# Patient Record
Sex: Female | Born: 1965 | Race: White | Hispanic: No | Smoking: Current every day smoker
Health system: Southern US, Community
[De-identification: ages and names within clinical notes are randomized; demographics above are authoritative.]

## PROBLEM LIST (undated history)

## (undated) DIAGNOSIS — F419 Anxiety disorder, unspecified: Secondary | ICD-10-CM

## (undated) DIAGNOSIS — G43909 Migraine, unspecified, not intractable, without status migrainosus: Secondary | ICD-10-CM

## (undated) DIAGNOSIS — J449 Chronic obstructive pulmonary disease, unspecified: Secondary | ICD-10-CM

## (undated) DIAGNOSIS — E119 Type 2 diabetes mellitus without complications: Secondary | ICD-10-CM

## (undated) DIAGNOSIS — G4730 Sleep apnea, unspecified: Secondary | ICD-10-CM

## (undated) DIAGNOSIS — T7840XA Allergy, unspecified, initial encounter: Secondary | ICD-10-CM

## (undated) DIAGNOSIS — IMO0002 Reserved for concepts with insufficient information to code with codable children: Secondary | ICD-10-CM

## (undated) DIAGNOSIS — Z87448 Personal history of other diseases of urinary system: Secondary | ICD-10-CM

## (undated) DIAGNOSIS — F329 Major depressive disorder, single episode, unspecified: Secondary | ICD-10-CM

## (undated) DIAGNOSIS — M1990 Unspecified osteoarthritis, unspecified site: Secondary | ICD-10-CM

## (undated) DIAGNOSIS — F32A Depression, unspecified: Secondary | ICD-10-CM

## (undated) DIAGNOSIS — K219 Gastro-esophageal reflux disease without esophagitis: Secondary | ICD-10-CM

## (undated) DIAGNOSIS — J45909 Unspecified asthma, uncomplicated: Secondary | ICD-10-CM

## (undated) DIAGNOSIS — N189 Chronic kidney disease, unspecified: Secondary | ICD-10-CM

## (undated) DIAGNOSIS — I10 Essential (primary) hypertension: Secondary | ICD-10-CM

## (undated) HISTORY — PX: COLON SURGERY: SHX602

## (undated) HISTORY — DX: Sleep apnea, unspecified: G47.30

## (undated) HISTORY — PX: LAPAROSCOPIC CHOLECYSTECTOMY: SUR755

## (undated) HISTORY — PX: ABDOMINAL HYSTERECTOMY: SHX81

## (undated) HISTORY — DX: Unspecified asthma, uncomplicated: J45.909

## (undated) HISTORY — DX: Anxiety disorder, unspecified: F41.9

## (undated) HISTORY — DX: Personal history of other diseases of urinary system: Z87.448

## (undated) HISTORY — DX: Unspecified osteoarthritis, unspecified site: M19.90

## (undated) HISTORY — DX: Essential (primary) hypertension: I10

## (undated) HISTORY — DX: Gastro-esophageal reflux disease without esophagitis: K21.9

## (undated) HISTORY — DX: Major depressive disorder, single episode, unspecified: F32.9

## (undated) HISTORY — PX: APPENDECTOMY: SHX54

## (undated) HISTORY — PX: BLADDER SURGERY: SHX569

## (undated) HISTORY — DX: Reserved for concepts with insufficient information to code with codable children: IMO0002

## (undated) HISTORY — DX: Chronic kidney disease, unspecified: N18.9

## (undated) HISTORY — DX: Migraine, unspecified, not intractable, without status migrainosus: G43.909

## (undated) HISTORY — DX: Depression, unspecified: F32.A

## (undated) HISTORY — DX: Allergy, unspecified, initial encounter: T78.40XA

## (undated) HISTORY — PX: PARTIAL HYSTERECTOMY: SHX80

## (undated) NOTE — *Deleted (*Deleted)
Marili was to begin pulmonary rehab 08/23/2020, but developed blisters on her arm associated with pain.  She went to her PCP and she definitely has shingles.  She would like to be discharged from pulmonary rehab, without having completed any exercise sessions, due to not knowing when she has recovered from shingles ( possibly 3-5 weeks).  I mailed a self addressed envelope to her to return the door entry badge.

---
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
## 2001-09-15 ENCOUNTER — Emergency Department (HOSPITAL_COMMUNITY): Admission: EM | Admit: 2001-09-15 | Discharge: 2001-09-16 | Payer: Self-pay | Admitting: Emergency Medicine

## 2001-09-15 ENCOUNTER — Encounter: Payer: Self-pay | Admitting: Emergency Medicine

## 2002-05-25 ENCOUNTER — Emergency Department (HOSPITAL_COMMUNITY): Admission: EM | Admit: 2002-05-25 | Discharge: 2002-05-25 | Payer: Self-pay | Admitting: Emergency Medicine

## 2004-02-12 ENCOUNTER — Emergency Department (HOSPITAL_COMMUNITY): Admission: EM | Admit: 2004-02-12 | Discharge: 2004-02-12 | Payer: Self-pay | Admitting: Family Medicine

## 2004-03-18 ENCOUNTER — Inpatient Hospital Stay (HOSPITAL_COMMUNITY): Admission: AD | Admit: 2004-03-18 | Discharge: 2004-03-18 | Payer: Self-pay | Admitting: Obstetrics & Gynecology

## 2004-03-28 ENCOUNTER — Encounter: Admission: RE | Admit: 2004-03-28 | Discharge: 2004-03-28 | Payer: Self-pay | Admitting: Obstetrics and Gynecology

## 2004-11-11 ENCOUNTER — Emergency Department (HOSPITAL_COMMUNITY): Admission: EM | Admit: 2004-11-11 | Discharge: 2004-11-11 | Payer: Self-pay | Admitting: Family Medicine

## 2006-02-11 ENCOUNTER — Other Ambulatory Visit: Admission: RE | Admit: 2006-02-11 | Discharge: 2006-02-11 | Payer: Self-pay | Admitting: Internal Medicine

## 2006-10-23 ENCOUNTER — Emergency Department (HOSPITAL_COMMUNITY): Admission: EM | Admit: 2006-10-23 | Discharge: 2006-10-23 | Payer: Self-pay | Admitting: Emergency Medicine

## 2006-10-28 ENCOUNTER — Encounter: Admission: RE | Admit: 2006-10-28 | Discharge: 2006-10-28 | Payer: Self-pay | Admitting: Occupational Medicine

## 2008-03-12 ENCOUNTER — Emergency Department (HOSPITAL_COMMUNITY): Admission: EM | Admit: 2008-03-12 | Discharge: 2008-03-12 | Payer: Self-pay | Admitting: Emergency Medicine

## 2016-08-02 ENCOUNTER — Ambulatory Visit (INDEPENDENT_AMBULATORY_CARE_PROVIDER_SITE_OTHER): Payer: Self-pay | Admitting: Orthopaedic Surgery

## 2016-08-02 DIAGNOSIS — S8265XA Nondisplaced fracture of lateral malleolus of left fibula, initial encounter for closed fracture: Secondary | ICD-10-CM

## 2016-08-09 ENCOUNTER — Telehealth (INDEPENDENT_AMBULATORY_CARE_PROVIDER_SITE_OTHER): Payer: Self-pay | Admitting: Radiology

## 2016-08-09 NOTE — Telephone Encounter (Signed)
Patient called [place] office, I cannot find the original message, I believe that the patient wanted medication.

## 2016-08-10 ENCOUNTER — Other Ambulatory Visit (INDEPENDENT_AMBULATORY_CARE_PROVIDER_SITE_OTHER): Payer: Self-pay | Admitting: Orthopaedic Surgery

## 2016-08-10 MED ORDER — ACETAMINOPHEN-CODEINE #3 300-30 MG PO TABS: ORAL_TABLET | ORAL | 0 refills | Status: DC

## 2016-08-10 NOTE — Telephone Encounter (Signed)
PER PHONE CONVERSATION WITH DR Lorin Mercy, CALL IN TYLENOL #3 1 PO Q 4-6 HOURS PRN PAIN #40. ALSO ADVISE PATIENT TO ELEVATE ABOVE HER HEART FOR DECREASED PAIN. I CALLED IN TO PHARMACY AND LEFT VOICEMAIL FOR PATIENT ADVISING.

## 2016-08-10 NOTE — Telephone Encounter (Signed)
Pt request something for ankle pain, she states that Dr Lorin Mercy has told her to try Ibuprofen for pain but its not working and the pain is unbearable. [facility] in [place].

## 2016-08-10 NOTE — Telephone Encounter (Signed)
Please advise. Patient states that she called [facility] office yesterday, but we did not get the message to you.

## 2016-08-10 NOTE — Telephone Encounter (Signed)
noted 

## 2016-08-13 NOTE — Telephone Encounter (Signed)
Correct

## 2016-08-23 ENCOUNTER — Encounter (INDEPENDENT_AMBULATORY_CARE_PROVIDER_SITE_OTHER): Payer: Self-pay | Admitting: Orthopaedic Surgery

## 2016-08-23 ENCOUNTER — Ambulatory Visit (INDEPENDENT_AMBULATORY_CARE_PROVIDER_SITE_OTHER): Payer: Self-pay

## 2016-08-23 ENCOUNTER — Ambulatory Visit (INDEPENDENT_AMBULATORY_CARE_PROVIDER_SITE_OTHER): Payer: Self-pay | Admitting: Orthopaedic Surgery

## 2016-08-23 VITALS — BP 105/61 | HR 72 | Ht 62.0 in | Wt 170.0 lb

## 2016-08-23 DIAGNOSIS — M25572 Pain in left ankle and joints of left foot: Secondary | ICD-10-CM

## 2016-08-23 NOTE — Progress Notes (Signed)
Office Visit Note   Patient: Jillian [name]           Date of Birth: 1966/07/01           MRN: [id number] Visit Date: 08/23/2016              Requested by: No referring provider defined for this encounter. PCP: No primary care provider on file.   Assessment & Plan: Visit Diagnoses:  1. Pain in left ankle and joints of left foot            Secondary to left lateral ankle sprain. Fibular chip avulsion which may be from last years injury or from her most recent on-the-job injury.  Plan: We will place her in a lace up [place]e-O AFO. 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 I plan to check her in 6 weeks for final visit and assigning minimal impairment rating.  Follow-Up Instructions: Return in about 6 weeks (around 10/04/2016).   Orders:  Orders Placed This Encounter  Procedures  . XR Ankle Complete Left   No orders of the defined types were placed in this encounter.     Procedures: No procedures performed   Clinical Data: No additional findings.   Subjective: Chief Complaint  Patient presents with  . Left Ankle - Follow-up, Fracture    Patient is here for follow up fracture distal left fibula on October 16.  She is being covered under [employer]. She is off of the crutches. States that she does use her knee walker some but is improving. She has not had her DEXA yet.  She continues to wear her boot and use Tylenol 3 as needed.   Patient had past history of severe ankle sprain left ankle about one year ago.  Review of Systems  Constitutional: Negative for chills and diaphoresis.  HENT: Negative for ear discharge, ear pain and nosebleeds.   Eyes: Negative for discharge and visual disturbance.  Respiratory: Negative for cough, choking and shortness of breath.   Cardiovascular: Negative for chest pain and palpitations.    Gastrointestinal: Negative for abdominal distention and abdominal pain.  Endocrine: Negative for cold intolerance and heat intolerance.  Genitourinary: Negative for flank pain and hematuria.  Skin: Negative for rash and wound.  Neurological: Negative for seizures and speech difficulty.  Hematological: Negative for adenopathy. Does not bruise/bleed easily.  Psychiatric/Behavioral: Negative for agitation and suicidal ideas.     Objective: Vital Signs: BP 105/61   Pulse 72   Ht 5\' 2"  (1.575 m)   Wt 170 lb (77.1 kg)   BMI 31.09 kg/m   Physical Exam  Constitutional: She is oriented to person, place, and time. She appears well-developed.  HENT:  Head: Normocephalic.  Right Ear: External ear normal.  Left Ear: External ear normal.  Eyes: Pupils are equal, round, and reactive to light.  Neck: No tracheal deviation present. No thyromegaly present.  Cardiovascular: Normal rate.   Pulmonary/Chest: Effort normal.  Abdominal: Soft.  Neurological: She is alert and oriented to person, place, and time.  Skin: Skin is warm and dry.  Psychiatric: She has a normal mood and affect. Her behavior is normal.    Ortho Exam patient's upper extremities are normal. Tattoo right forearm no rash or exposed skin. Ankle and knee range of motion on the right side is normal. Trace  tenderness over the distal fibula and anterior talofibular. All 1+ anterior drawer  nontender. Pulses are normal no foot edema. Calf is normal negative Homans.  Specialty Comments:  No specialty comments available.  Imaging: Xr Ankle Complete Left  Result Date: 08/23/2016 Three-view x-rays left ankle demonstrate chip avulsion off the inferior aspect of the fibula consistent with lateral ankle ligamentous avulsion. Ankle mortise is well reduced. Assessment left ankle fibula lateral ligamentous chip avulsion    PMFS History: Patient Active Problem List   Diagnosis Date Noted  . Pain in left ankle and joints of left foot  08/23/2016   Past Medical History:  Diagnosis Date  . Anxiety   . Arthritis   . Asthma   . Depression   . High blood pressure   . History of bladder problems   . Migraines   . Sleep apnea     History reviewed. No pertinent family history.  Past Surgical History:  Procedure Laterality Date  . APPENDECTOMY    . BLADDER SURGERY     x3  . LAPAROSCOPIC CHOLECYSTECTOMY    . PARTIAL HYSTERECTOMY     Social History   Occupational History  . Not on file.   Social History Main Topics  . Smoking status: Current Every Day Smoker    Types: Cigarettes  . Smokeless tobacco: Never Used  . Alcohol use Yes     Comment: twice a week  . Drug use: No  . Sexual activity: Not on file

## 2016-08-30 NOTE — Progress Notes (Signed)
WC

## 2016-10-04 ENCOUNTER — Ambulatory Visit (INDEPENDENT_AMBULATORY_CARE_PROVIDER_SITE_OTHER): Payer: Self-pay | Admitting: Orthopaedic Surgery

## 2016-10-11 ENCOUNTER — Ambulatory Visit (INDEPENDENT_AMBULATORY_CARE_PROVIDER_SITE_OTHER): Payer: Self-pay | Admitting: Orthopaedic Surgery

## 2016-10-25 ENCOUNTER — Ambulatory Visit (INDEPENDENT_AMBULATORY_CARE_PROVIDER_SITE_OTHER): Payer: Self-pay | Admitting: Orthopaedic Surgery

## 2017-03-02 ENCOUNTER — Ambulatory Visit (INDEPENDENT_AMBULATORY_CARE_PROVIDER_SITE_OTHER): Payer: Self-pay | Admitting: Otolaryngology

## 2017-05-09 ENCOUNTER — Ambulatory Visit (INDEPENDENT_AMBULATORY_CARE_PROVIDER_SITE_OTHER): Payer: 59 | Admitting: Otolaryngology

## 2017-05-09 DIAGNOSIS — R490 Dysphonia: Secondary | ICD-10-CM | POA: Diagnosis not present

## 2017-05-09 DIAGNOSIS — D3709 Neoplasm of uncertain behavior of other specified sites of the oral cavity: Secondary | ICD-10-CM | POA: Diagnosis not present

## 2017-06-24 ENCOUNTER — Ambulatory Visit (INDEPENDENT_AMBULATORY_CARE_PROVIDER_SITE_OTHER): Payer: 59 | Admitting: Otolaryngology

## 2017-06-24 DIAGNOSIS — R1312 Dysphagia, oropharyngeal phase: Secondary | ICD-10-CM

## 2017-06-24 DIAGNOSIS — K219 Gastro-esophageal reflux disease without esophagitis: Secondary | ICD-10-CM

## 2017-06-24 DIAGNOSIS — R42 Dizziness and giddiness: Secondary | ICD-10-CM

## 2017-06-24 DIAGNOSIS — H93293 Other abnormal auditory perceptions, bilateral: Secondary | ICD-10-CM

## 2017-07-29 DIAGNOSIS — G5603 Carpal tunnel syndrome, bilateral upper limbs: Secondary | ICD-10-CM | POA: Insufficient documentation

## 2017-08-05 ENCOUNTER — Ambulatory Visit (INDEPENDENT_AMBULATORY_CARE_PROVIDER_SITE_OTHER): Payer: 59 | Admitting: Otolaryngology

## 2017-08-05 DIAGNOSIS — K219 Gastro-esophageal reflux disease without esophagitis: Secondary | ICD-10-CM

## 2017-08-05 DIAGNOSIS — R490 Dysphonia: Secondary | ICD-10-CM

## 2017-08-22 ENCOUNTER — Encounter: Payer: Self-pay | Admitting: Neurology

## 2017-08-22 ENCOUNTER — Ambulatory Visit (INDEPENDENT_AMBULATORY_CARE_PROVIDER_SITE_OTHER): Payer: 59 | Admitting: Neurology

## 2017-08-22 VITALS — BP 124/64 | HR 105 | Ht 62.0 in | Wt 176.0 lb

## 2017-08-22 DIAGNOSIS — M542 Cervicalgia: Secondary | ICD-10-CM

## 2017-08-22 DIAGNOSIS — R32 Unspecified urinary incontinence: Secondary | ICD-10-CM | POA: Insufficient documentation

## 2017-08-22 DIAGNOSIS — R42 Dizziness and giddiness: Secondary | ICD-10-CM | POA: Diagnosis not present

## 2017-08-22 DIAGNOSIS — R202 Paresthesia of skin: Secondary | ICD-10-CM | POA: Diagnosis not present

## 2017-08-22 DIAGNOSIS — R52 Pain, unspecified: Secondary | ICD-10-CM

## 2017-08-22 DIAGNOSIS — N393 Stress incontinence (female) (male): Secondary | ICD-10-CM

## 2017-08-22 MED ORDER — VENLAFAXINE HCL ER 75 MG PO CP24: 75.0000 mg | ORAL_CAPSULE | Freq: Every day | ORAL | 11 refills | Status: DC

## 2017-08-22 NOTE — Progress Notes (Signed)
PATIENT: Jillian [name] DOB: 01-Jun-1966  Chief Complaint  Patient presents with  . New Patient (Initial Visit)    DAVID TAPPER, MD.  Patient says that she is doing better since being given a nasal spray. Patient does mention some pain and numbness and tingling in her extremities.      HISTORICAL  Jillian [name] is a 51 years old female, seen in refer by  her primary care doctor Deloria Lair. for evaluation of of numbness tingling in her extremity, initial evaluation was on August 22 2017.  I reviewed and summarized the referring note, she has history of hypertension, fibromyalgia, irritable bowel, anxiety.  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  She did have a history of migraine headaches around age 27, her typical migraine are lateralized severe pounding headaches, with associated light noise sensitivity, light sensitivity, but most of her headaches now are holocranial not pressure headaches, there is no typical migraine features.  Today she mainly complains of diffuse body achy pain, this has been ongoing for more than 25 years, gradually getting worse, she complains of arm, leg deep achy pain, over the years, she has been on titrating the nose of BC powder, now she is taking 10 package each day.  Recent few months, she also noticed intermittent bilateral fingertips, and feet paresthesia  REVIEW OF SYSTEMS: Full 14 system review of systems performed and notable only for weight gain, fatigue, shortness of breath, wheezing, incontinence, feeling hot, cold, joint pain, swelling, cramps,  achy muscles, depression, anxiety, too much sleep, not enough sleep, decreased energy, disinteresting activities, racing thoughts.  ALLERGIES: Allergies  Allergen Reactions  . Doxycycline     HOME MEDICATIONS: Current Outpatient Medications  Medication Sig Dispense Refill  . albuterol (ACCUNEB) 0.63 MG/3ML nebulizer solution Take 1 ampule by nebulization every 6 (six) hours as needed for wheezing.    . DULoxetine (CYMBALTA) 60 MG capsule Take 60 mg by mouth daily.    . fluticasone-salmeterol (ADVAIR HFA) 115-21 MCG/ACT inhaler Inhale 2 puffs into the lungs 2 (two) times daily.    Marland Kitchen omeprazole (PRILOSEC) 20 MG capsule Take 20 mg 2 (two) times daily by mouth.    . pregabalin (LYRICA) 75 MG capsule Take 75 mg 3 (three) times daily by mouth.    . tiotropium (SPIRIVA) 18 MCG inhalation capsule Place 18 mcg into inhaler and inhale daily.    . valsartan-hydrochlorothiazide (DIOVAN-HCT) 160-12.5 MG tablet Take 1 tablet by mouth daily.     No current facility-administered medications for this visit.     PAST MEDICAL HISTORY: Past Medical History:  Diagnosis Date  . Anxiety   . Arthritis   . Asthma   . Depression   . High blood pressure   . History of bladder problems   . Migraines   . Sleep apnea     PAST SURGICAL HISTORY: Past Surgical History:  Procedure Laterality Date  . APPENDECTOMY    . BLADDER SURGERY     x3  . LAPAROSCOPIC CHOLECYSTECTOMY    . PARTIAL HYSTERECTOMY      FAMILY HISTORY: No family  history on file.  SOCIAL HISTORY:  Social History   Socioeconomic History  . Marital status: Divorced    Spouse name: Not on file  . Number of children: Not on file  . Years of education: Not on file  . Highest education level: Not on file  Social Needs  . Financial resource strain: Not on file  . Food insecurity - worry: Not on file  . Food insecurity - inability: Not on file  . Transportation needs - medical: Not on file  . Transportation needs - non-medical: Not  on file  Occupational History  . Not on file  Tobacco Use  . Smoking status: Current Every Day Smoker    Types: Cigarettes  . Smokeless tobacco: Never Used  Substance and Sexual Activity  . Alcohol use: Yes    Comment: twice a week  . Drug use: No  . Sexual activity: Not on file  Other Topics Concern  . Not on file  Social History Narrative  . Not on file     PHYSICAL EXAM   Vitals:   08/22/17 0747  BP: 124/64  Pulse: (!) 105  Weight: 176 lb (79.8 kg)  Height: 5\' 2"  (1.575 m)    Not recorded      Body mass index is 32.19 kg/m.  PHYSICAL EXAMNIATION:  Gen: NAD, conversant, well nourised, obese, well groomed                     Cardiovascular: Regular rate rhythm, no peripheral edema, warm, nontender. Eyes: Conjunctivae clear without exudates or hemorrhage Neck: Supple, no carotid bruits. Pulmonary: Clear to auscultation bilaterally   NEUROLOGICAL EXAM:  MENTAL STATUS: Speech:    Speech is normal; fluent and spontaneous with normal comprehension.  Cognition:     Orientation to time, place and person     Normal recent and remote memory     Normal Attention span and concentration     Normal Language, naming, repeating,spontaneous speech     Fund of knowledge   CRANIAL NERVES: CN II: Visual fields are full to confrontation. Fundoscopic exam is normal with sharp discs and no vascular changes. Pupils are round equal and briskly reactive to light. CN III, IV, VI: extraocular movement are normal. No ptosis. CN V: Facial sensation is intact to pinprick in all 3 divisions bilaterally. Corneal responses are intact.  CN VII: Face is symmetric with normal eye closure and smile. CN VIII: Hearing is normal to rubbing fingers CN IX, X: Palate elevates symmetrically. Phonation is normal. CN XI: Head turning and shoulder shrug are intact CN XII: Tongue is midline with normal movements and no atrophy.  MOTOR: There is no pronator drift of out-stretched arms. Muscle bulk  and tone are normal. Muscle strength is normal.  REFLEXES: Reflexes are 2+ and symmetric at the biceps, triceps, knees, and ankles. Plantar responses are flexor.  SENSORY: Intact to light touch, pinprick, positional sensation and vibratory sensation are intact in fingers and toes.  COORDINATION: Rapid alternating movements and fine finger movements are intact. There is no dysmetria on finger-to-nose and heel-knee-shin.    GAIT/STANCE: Posture is normal. Gait is steady with normal steps, base, arm swing, and turning. Heel and toe walking are normal. Tandem gait is normal.  Romberg is absent.   DIAGNOSTIC DATA (LABS, IMAGING, TESTING) - I reviewed patient records, labs, notes, testing and imaging myself where available.   ASSESSMENT AND PLAN  Jillian [name] is a 50 y.o. female   Diffuse  body achy pain Chronic headache, history of migraine headache  Laboratory evaluation for treatable etiology  She was found to have hyperreflexia on examinations, also complains of intermittent bilateral upper lower extremity paresthesia,  MRI of cervical spine to rule out spondylitic myelopathy  EMG nerve conduction study  Marcial Pacas, M.D. Ph.D.  [facility] [address] [place], [place] [address] Ph: [phone] Fax: [phone]  MC:[id number], Zella Richer., MD

## 2017-08-23 ENCOUNTER — Encounter: Payer: Self-pay | Admitting: Neurology

## 2017-08-23 ENCOUNTER — Telehealth: Payer: Self-pay

## 2017-08-23 DIAGNOSIS — M542 Cervicalgia: Secondary | ICD-10-CM | POA: Insufficient documentation

## 2017-08-23 LAB — SEDIMENTATION RATE: Sed Rate: 8 mm/hr (ref 0–40)

## 2017-08-23 LAB — COMPREHENSIVE METABOLIC PANEL
A/G RATIO: 1.7 (ref 1.2–2.2)
ALK PHOS: 94 IU/L (ref 39–117)
ALT: 20 IU/L (ref 0–32)
AST: 16 IU/L (ref 0–40)
Albumin: 4.3 g/dL (ref 3.5–5.5)
BUN/Creatinine Ratio: 22 (ref 9–23)
BUN: 16 mg/dL (ref 6–24)
Bilirubin Total: <0.2 mg/dL (ref 0.0–1.2)
CALCIUM: 9.3 mg/dL (ref 8.7–10.2)
CHLORIDE: 101 mmol/L (ref 96–106)
CO2: 25 mmol/L (ref 20–29)
Creatinine, Ser: 0.74 mg/dL (ref 0.57–1.00)
GFR calc Af Amer: 107 mL/min/{1.73_m2} (ref >59)
GFR calc non Af Amer: 93 mL/min/{1.73_m2} (ref >59)
GLOBULIN, TOTAL: 2.5 g/dL (ref 1.5–4.5)
Glucose: 95 mg/dL (ref 65–99)
POTASSIUM: 4.2 mmol/L (ref 3.5–5.2)
SODIUM: 141 mmol/L (ref 134–144)
Total Protein: 6.8 g/dL (ref 6.0–8.5)

## 2017-08-23 LAB — CBC WITH DIFFERENTIAL
Basophils Absolute: 0.1 10*3/uL (ref 0.0–0.2)
Basos: 1 %
EOS (ABSOLUTE): 1 10*3/uL — AB (ref 0.0–0.4)
EOS: 8 %
HEMATOCRIT: 44.5 % (ref 34.0–46.6)
Hemoglobin: 14.9 g/dL (ref 11.1–15.9)
IMMATURE GRANULOCYTES: 0 %
Immature Grans (Abs): 0 10*3/uL (ref 0.0–0.1)
LYMPHS ABS: 2.3 10*3/uL (ref 0.7–3.1)
Lymphs: 20 %
MCH: 32 pg (ref 26.6–33.0)
MCHC: 33.5 g/dL (ref 31.5–35.7)
MCV: 96 fL (ref 79–97)
MONOS ABS: 0.8 10*3/uL (ref 0.1–0.9)
Monocytes: 7 %
NEUTROS PCT: 64 %
Neutrophils Absolute: 7.2 10*3/uL — ABNORMAL HIGH (ref 1.4–7.0)
RBC: 4.66 x10E6/uL (ref 3.77–5.28)
RDW: 14.7 % (ref 12.3–15.4)
WBC: 11.3 10*3/uL — AB (ref 3.4–10.8)

## 2017-08-23 LAB — CK: Total CK: 52 U/L (ref 24–173)

## 2017-08-23 LAB — RPR: RPR: NONREACTIVE

## 2017-08-23 LAB — ANA W/REFLEX: Anti Nuclear Antibody(ANA): NEGATIVE

## 2017-08-23 LAB — C-REACTIVE PROTEIN: CRP: 10 mg/L — ABNORMAL HIGH (ref 0.0–4.9)

## 2017-08-23 LAB — VITAMIN B12: VITAMIN B 12: 340 pg/mL (ref 232–1245)

## 2017-08-23 LAB — VITAMIN D 25 HYDROXY (VIT D DEFICIENCY, FRACTURES): VIT D 25 HYDROXY: 10.5 ng/mL — AB (ref 30.0–100.0)

## 2017-08-23 LAB — TSH: TSH: 1.44 u[IU]/mL (ref 0.450–4.500)

## 2017-08-23 NOTE — Telephone Encounter (Signed)
Patient was in yesterday and was given a prescription for Effexor and was told to take it at bedtime. She says that the bottle says to take with breakfast, so she wants to be sure that it is still ok to take at bedtime. She also wanted to make sure that she was supposed to continue taking the Cymbalta in the AM?

## 2017-08-26 NOTE — Telephone Encounter (Signed)
Pt returned RN's call °

## 2017-08-26 NOTE — Telephone Encounter (Signed)
Left message requesting a return call.

## 2017-08-26 NOTE — Telephone Encounter (Signed)
Per vo by Dr. Krista Blue, continue duloxetine in am and take vanlafaxine in pm.  Spoke to patient and she verbalized understanding.

## 2017-08-27 ENCOUNTER — Telehealth: Payer: Self-pay | Admitting: Neurology

## 2017-08-27 ENCOUNTER — Encounter: Payer: Self-pay | Admitting: *Deleted

## 2017-08-27 NOTE — Telephone Encounter (Signed)
Please call patient, laboratory evaluation showed low vitamin D with level 10.5, she should start vitamin D3 supplement 2000 units daily, mild elevated C reactive protein of 10.   Rest of the laboratory evaluation showed no significant abnormality

## 2017-08-27 NOTE — Telephone Encounter (Signed)
Spoke to patient - she is aware of lab results and agreeable to start the recommended supplement.

## 2017-08-27 NOTE — Telephone Encounter (Signed)
Left message requesting a return call.

## 2017-08-27 NOTE — Telephone Encounter (Signed)
Patient is returning your call.  

## 2017-09-03 ENCOUNTER — Telehealth: Payer: Self-pay | Admitting: Neurology

## 2017-09-03 NOTE — Telephone Encounter (Signed)
Patient is calling to discuss going back to work on 09-09-17.

## 2017-09-03 NOTE — Telephone Encounter (Signed)
Patient states she is calling today to schedule her MRI.  She has a pending appt for NCV/EMG on 09/20/17.  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

## 2017-09-10 ENCOUNTER — Telehealth: Payer: Self-pay | Admitting: Neurology

## 2017-09-10 NOTE — Telephone Encounter (Signed)
Jillian [name] with [facility] emailed me and informed me that Jillian [name] denied the MRI Cervical. They sent clinical notes on 09/04/17. The phone number for the peer to peer is [phone]. The case number is [id number]. Right now she is scheduled for Friday 09/13/17 at [place].

## 2017-09-10 NOTE — Telephone Encounter (Signed)
Dr. Candie Echevaria will be returning the call to complete the peer-to-peer review.  She will be calling on Dr. Rhea Belton mobile number.

## 2017-09-12 NOTE — Telephone Encounter (Signed)
[id number], MRI cervical,

## 2017-09-12 NOTE — Telephone Encounter (Signed)
Noted,  Thank you!

## 2017-09-12 NOTE — Telephone Encounter (Signed)
Dr. Glade Nurse will call today at 4:30, on Dr. Rhea Belton mobile number, to complete the peer-to-peer.

## 2017-09-13 ENCOUNTER — Ambulatory Visit
Admission: RE | Admit: 2017-09-13 | Discharge: 2017-09-13 | Disposition: A | Discharge status: Home / Self Care | Payer: 59 | Source: Ambulatory Visit | Attending: Neurology | Admitting: Neurology

## 2017-09-13 DIAGNOSIS — M542 Cervicalgia: Secondary | ICD-10-CM

## 2017-09-16 ENCOUNTER — Telehealth: Payer: Self-pay | Admitting: *Deleted

## 2017-09-16 NOTE — Telephone Encounter (Signed)
-----   Message from Marcial Pacas, MD sent at 09/16/2017 12:06 PM EST ----- Please call patient: MRI of cervical spine showed mild degenerative changes, there was no evidence of spinal cord or nerve root compression.

## 2017-09-16 NOTE — Telephone Encounter (Signed)
Called and spoke with Jillian [name]. Relayed MRI cervical spine showed mild degenerative changes, no evidence of spinal cord or nerve root compression. Reminded her of appt on 08/21/17 for EMG/NCS at 12pm. Check in 1130am. She verbalized understanding and appreciation for call.

## 2017-09-20 ENCOUNTER — Encounter (INDEPENDENT_AMBULATORY_CARE_PROVIDER_SITE_OTHER): Payer: 59 | Admitting: Neurology

## 2017-09-20 ENCOUNTER — Encounter: Payer: Self-pay | Admitting: Neurology

## 2017-09-20 ENCOUNTER — Ambulatory Visit (INDEPENDENT_AMBULATORY_CARE_PROVIDER_SITE_OTHER): Payer: 59 | Admitting: Neurology

## 2017-09-20 DIAGNOSIS — R202 Paresthesia of skin: Secondary | ICD-10-CM

## 2017-09-20 DIAGNOSIS — Z0289 Encounter for other administrative examinations: Secondary | ICD-10-CM

## 2017-09-20 DIAGNOSIS — N393 Stress incontinence (female) (male): Secondary | ICD-10-CM

## 2017-09-20 DIAGNOSIS — R52 Pain, unspecified: Secondary | ICD-10-CM

## 2017-09-20 DIAGNOSIS — G5603 Carpal tunnel syndrome, bilateral upper limbs: Secondary | ICD-10-CM

## 2017-09-20 DIAGNOSIS — R42 Dizziness and giddiness: Secondary | ICD-10-CM | POA: Diagnosis not present

## 2017-09-20 MED ORDER — SUMATRIPTAN SUCCINATE 50 MG PO TABS: 50.0000 mg | ORAL_TABLET | ORAL | 6 refills | Status: DC | PRN

## 2017-09-20 MED ORDER — PREDNISONE 2.5 MG PO TABS: 10.0000 mg | ORAL_TABLET | Freq: Every day | ORAL | 0 refills | Status: DC

## 2017-09-20 NOTE — Progress Notes (Signed)
PATIENT: Jillian [name] DOB: 04-10-1966  No chief complaint on file.    HISTORICAL  Jillian [name] is a 51 years old female, seen in refer by  her primary care doctor Deloria Lair. for evaluation of of numbness tingling in her extremity, initial evaluation was on August 22 2017.  I reviewed and summarized the referring note, she has history of hypertension, fibromyalgia, irritable bowel, anxiety.  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  She did have a history of migraine headaches around age 24, her typical migraine are lateralized severe pounding headaches, with associated light noise sensitivity, light sensitivity, but most of her headaches now are holocranial not pressure headaches, there is no typical migraine features.  Today she mainly complains of diffuse body achy pain, this has been ongoing for more than 25 years, gradually getting worse, she complains of arm, leg deep achy pain, over the years, she has been on titrating the nose of BC powder, now she is taking 10 package each day.  Recent few months, she also noticed intermittent bilateral fingertips, and feet paresthesia  Update September 21, 2017:  Laboratory evaluation showed normal CPK, TSH, ANA, ESR, RPR, B12, CMP, C-reactive protein was slightly elevated 10, significantly decreased vitamin D level 10,  She was trying to tapering off BC powder, still complains of diffuse body achy pain, take ibuprofen 8 tablets at one time, also complains of intermittent headaches,  MRI of cervical spine showed loss of normal curvature,  multilevel degenerative changes, no significant foraminal canal stenosis.  EMG nerve conduction study today showed evidence of there is no evidence of inflammatory myopathy, right cervical radiculopathy, or right lumbosacral radiculopathy.  She is already on polypharmacy Cymbalta 60 mg daily, Lyrica 75 mg 3 times a day,  REVIEW OF SYSTEMS: Full 14 system review of systems performed and notable only for weight gain, fatigue, shortness of breath, wheezing, incontinence, feeling hot, cold, joint pain, swelling, cramps, achy muscles, depression, anxiety, too much sleep, not enough sleep, decreased energy, disinteresting activities, racing thoughts.  ALLERGIES: Allergies  Allergen Reactions  . Doxycycline     HOME MEDICATIONS: Current Outpatient Medications  Medication Sig Dispense Refill  . albuterol (ACCUNEB) 0.63 MG/3ML nebulizer solution Take 1 ampule by nebulization every 6 (six) hours as needed for wheezing.    . Cholecalciferol (VITAMIN D3) 2000 units TABS Take 2,000 Units daily by mouth.    . DULoxetine (CYMBALTA) 60 MG capsule Take 60 mg by mouth daily.    . fluticasone-salmeterol (ADVAIR HFA) 115-21 MCG/ACT inhaler Inhale 2 puffs into the lungs 2 (two) times daily.    Marland Kitchen omeprazole (PRILOSEC) 20 MG capsule Take 20 mg 2 (two) times daily by mouth.    . pregabalin (LYRICA) 75 MG capsule Take 75 mg 3 (three) times daily by mouth.    . tiotropium (SPIRIVA) 18 MCG inhalation capsule Place 18 mcg into inhaler and inhale daily.    . valsartan-hydrochlorothiazide (DIOVAN-HCT) 160-12.5 MG tablet Take 1 tablet by mouth daily.    Marland Kitchen venlafaxine XR (EFFEXOR XR) 75 MG 24 hr capsule Take 1 capsule (75 mg  total) daily with breakfast by mouth. 30 capsule 11   No current facility-administered medications for this visit.     PAST MEDICAL HISTORY: Past Medical History:  Diagnosis Date  . Anxiety   . Arthritis   . Asthma   . Depression   . High blood pressure   . History of bladder problems   .  Migraines   . Sleep apnea     PAST SURGICAL HISTORY: Past Surgical History:  Procedure Laterality Date  . APPENDECTOMY    . BLADDER SURGERY     x3  . LAPAROSCOPIC CHOLECYSTECTOMY    . PARTIAL HYSTERECTOMY      FAMILY HISTORY: No family history on file.  SOCIAL HISTORY:  Social History   Socioeconomic History  . Marital status: Divorced    Spouse name: Not on file  . Number of children: Not on file  . Years of education: Not on file  . Highest education level: Not on file  Social Needs  . Financial resource strain: Not on file  . Food insecurity - worry: Not on file  . Food insecurity - inability: Not on file  . Transportation needs - medical: Not on file  . Transportation needs - non-medical: Not on file  Occupational History  . Not on file  Tobacco Use  . Smoking status: Current Every Day Smoker    Types: Cigarettes  . Smokeless tobacco: Never Used  Substance and Sexual Activity  . Alcohol use: Yes    Comment: twice a week  . Drug use: No  . Sexual activity: Not on file  Other Topics Concern  . Not on file  Social History Narrative  . Not on file     PHYSICAL EXAM   There were no vitals filed for this visit.  Not recorded      There is no height or weight on file to calculate BMI.  PHYSICAL EXAMNIATION:  Gen: NAD, conversant, well nourised, obese, well groomed                     Cardiovascular: Regular rate rhythm, no peripheral edema, warm, nontender. Eyes: Conjunctivae clear without exudates or hemorrhage Neck: Supple, no carotid bruits. Pulmonary: Clear to auscultation bilaterally   NEUROLOGICAL EXAM:  MENTAL STATUS: Speech:    Speech is normal; fluent and spontaneous with normal comprehension.  Cognition:     Orientation to time, place and person     Normal recent and remote memory     Normal Attention span and concentration     Normal Language, naming, repeating,spontaneous speech     Fund of knowledge   CRANIAL NERVES: CN II:  Visual fields are full to confrontation. Fundoscopic exam is normal with sharp discs and no vascular changes. Pupils are round equal and briskly reactive to light. CN III, IV, VI: extraocular movement are normal. No ptosis. CN V: Facial sensation is intact to pinprick in all 3 divisions bilaterally. Corneal responses are intact.  CN VII: Face is symmetric with normal eye closure and smile. CN VIII: Hearing is normal to rubbing fingers CN IX, X: Palate elevates symmetrically. Phonation is normal. CN XI: Head turning and shoulder shrug are intact CN XII: Tongue is midline with normal movements and no atrophy.  MOTOR: There is no pronator drift of out-stretched arms. Muscle bulk and tone are normal. Muscle strength is normal.  REFLEXES: Reflexes are 2+ and symmetric at the biceps, triceps, knees, and ankles. Plantar responses are flexor.  SENSORY: Intact to  light touch, pinprick, positional sensation and vibratory sensation are intact in fingers and toes.  COORDINATION: Rapid alternating movements and fine finger movements are intact. There is no dysmetria on finger-to-nose and heel-knee-shin.    GAIT/STANCE: Posture is normal. Gait is steady with normal steps, base, arm swing, and turning. Heel and toe walking are normal. Tandem gait is normal.  Romberg is absent.   DIAGNOSTIC DATA (LABS, IMAGING, TESTING) - I reviewed patient records, labs, notes, testing and imaging myself where available.   ASSESSMENT AND PLAN  Jillian [name] is a 51 y.o. female   Diffuse body achy pain Chronic headache, history of migraine headache  Laboratory evaluation showed mild elevated C-reactive protein,  Likely a component of long-term NSAIDs, BC powder use, medicine rebound headache and body achy pain,  I emphasized the importance of stop over-the-counter medication use, with some mild elevated C-reactive protein, will have repeat laboratory evaluation, also starting low-dose prednisone treatment for  possible polymyalgia rheumatica  Keep Cymbalta 60 mg daily, Lyrica 75 mg daily  Imitrex 50 mg as needed for headaches,  Marcial Pacas, M.D. Ph.D.  [facility] [address] [place], [place] [address] Ph: [phone] Fax: [phone]  JN:[id number], Zella Richer., MD

## 2017-09-20 NOTE — Procedures (Signed)
Full Name: Jillian [name] Gender: Female MRN #: [id number] Date of Birth: [birth year]    Visit Date: 09/20/17 07:24 Age: 51 Years 29 Months Old Examining Physician: Marcial Pacas, MD  Referring Physician: Krista Blue, MD History:   51 year old female, complains of chronic diffuse body achy pain, take multiple BC powders on a daily basis.  Summary of the tests:  Nerve conduction study:  Bilateral median sensory responses showed borderline peak latency on the left side, mildly prolonged peak latency on the right side, with mildly decreased snap amplitude.  Bilateral ulnar sensory and motor responses were normal.  Bilateral median motor responses were within normal limit.  Right sural, superficial peroneal sensory responses were normal.  Right peroneal EDB and tibial motor responses were normal.  Electromyography: Selective needle examination of right upper extremity, cervical paraspinal, right lower extremity, lumbar and sacral paraspinal muscles were normal.  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  There is no evidence of right cervical radiculopathy or right lumbosacral radiculopathy.   ------------------------------- Marcial Pacas, M.D.  [facility] [place], [place] [address] Tel: [phone] Fax: [phone]        [facility]    Nerve / Sites Muscle Latency Ref. Amplitude Ref. Rel Amp Segments Distance Velocity Ref. Area    ms ms mV mV %  cm m/s m/s mVms  R Median - APB     Wrist APB 3.3 ?4.4 9.6 ?4.0 100 Wrist - APB 7   25.2     Upper arm APB 7.0  9.7  101 Upper arm - Wrist 20 54 ?49 25.8  L Median - APB     Wrist APB 3.8 ?4.4 9.1 ?4.0 100 Wrist - APB 7   27.6     Upper arm APB 7.7  9.0  99.6 Upper arm - Wrist 20 52 ?49 25.3  R Ulnar - ADM     Wrist ADM 2.0 ?3.3 8.8 ?6.0 100 Wrist - ADM 7   25.8   B.Elbow ADM 5.2  8.8  99.7 B.Elbow - Wrist 18 57 ?49 26.1     A.Elbow ADM 6.9  8.4  95.4 A.Elbow - B.Elbow 10 58 ?49 25.3         A.Elbow - Wrist      L Ulnar - ADM     Wrist ADM 2.1 ?3.3 8.8 ?6.0 100 Wrist - ADM 7   29.7     B.Elbow ADM 5.3  9.0  102 B.Elbow - Wrist 18 56 ?49 29.1     A.Elbow ADM 7.1  8.8  97.4 A.Elbow - B.Elbow 10 56 ?49 28.9         A.Elbow - Wrist      R Peroneal - EDB     Ankle EDB 3.7 ?6.5 5.5 ?2.0 100 Ankle - EDB 9   15.4     Fib head EDB 9.1  5.0  91.1 Fib head - Ankle 28 52 ?44 14.8     Pop fossa EDB 11.1  4.8  96.8 Pop fossa - Fib head 10 51 ?44 14.5         Pop fossa - Ankle      R Tibial - AH     Ankle AH 3.5 ?5.8 11.4 ?4.0 100 Ankle - AH 9   22.9     Pop fossa AH 10.5  8.3  73.3 Pop fossa - Ankle 31 44 ?41 21.2                 SNC    Nerve / Sites Rec. Site Peak Lat Ref.  Amp Ref. Segments Distance    ms ms V V  cm  R Sural - Ankle (Calf)     Calf Ankle 3.4 ?4.4 7 ?6 Calf - Ankle 14  R Superficial peroneal - Ankle     Lat leg Ankle 3.6 ?4.4 6 ?6 Lat leg - Ankle 14  R Median - Orthodromic (Dig II, Mid palm)     Dig II Wrist 4.0 ?3.4 4 ?10 Dig II - Wrist 13  L Median - Orthodromic (Dig II, Mid palm)     Dig II Wrist 3.4 ?3.4 6 ?10 Dig II - Wrist 13  R Ulnar - Orthodromic, (Dig V, Mid palm)     Dig V Wrist 2.3 ?3.1 10 ?5 Dig V - Wrist 11  L Ulnar - Orthodromic, (Dig V, Mid palm)     Dig V Wrist 2.2 ?3.1 11 ?5 Dig V - Wrist 28                  F  Wave    Nerve F Lat Ref.   ms ms  R Tibial - AH 44.8 ?56.0  R Ulnar - ADM 25.4 ?32.0  L Ulnar - ADM 25.1 ?32.0           EMG full       EMG Summary Table    Spontaneous MUAP Recruitment  Muscle IA Fib PSW Fasc Other Amp Dur. Poly Pattern  R. Tibialis anterior Normal None None None _______ Normal Normal Normal Normal  R. Tibialis posterior Normal None None None _______ Normal Normal Normal Normal  R. Gastrocnemius (Medial head) Normal None None None _______ Normal Normal Normal Normal  R. Peroneus  longus Normal None None None _______ Normal Normal Normal Normal  R. Vastus lateralis Normal None None None _______ Normal Normal Normal Normal  R. Lumbar paraspinals (mid) Normal None None None _______ Normal Normal Normal Normal  R. Lumbar paraspinals (low) Normal None None None _______ Normal Normal Normal Normal  R. First dorsal interosseous Normal None None None _______ Normal Normal Normal Normal  R. Pronator teres Normal None None None _______ Normal Normal Normal Normal  R. Biceps brachii Normal None None None _______ Normal Normal Normal Normal  R. Deltoid Normal None None None _______ Normal Normal Normal Normal  R. Cervical paraspinals Normal None None None _______ Normal Normal Normal Normal

## 2017-09-20 NOTE — Addendum Note (Signed)
Addended by: Marcial Pacas on: 09/20/2017 08:51 AM   Modules accepted: Orders

## 2017-09-21 LAB — FERRITIN: FERRITIN: 34 ng/mL (ref 15–150)

## 2017-09-21 LAB — C-REACTIVE PROTEIN: CRP: 12.5 mg/L — AB (ref 0.0–4.9)

## 2017-09-21 LAB — SEDIMENTATION RATE: SED RATE: 15 mm/h (ref 0–40)

## 2017-09-22 ENCOUNTER — Telehealth: Payer: Self-pay | Admitting: Neurology

## 2017-09-22 DIAGNOSIS — R52 Pain, unspecified: Secondary | ICD-10-CM

## 2017-09-22 DIAGNOSIS — R51 Headache: Secondary | ICD-10-CM

## 2017-09-22 DIAGNOSIS — R519 Headache, unspecified: Secondary | ICD-10-CM

## 2017-09-22 DIAGNOSIS — R799 Abnormal finding of blood chemistry, unspecified: Secondary | ICD-10-CM

## 2017-09-22 NOTE — Telephone Encounter (Signed)
Attempted patient again - unable to reach.

## 2017-09-22 NOTE — Telephone Encounter (Signed)
Please call patient, laboratory evaluation which usually indicating inflammatory process, please also check on her to see if the steroid tapering has helped her symptoms,

## 2017-09-22 NOTE — Telephone Encounter (Signed)
Left message letting her know we had some lab results to review and to see how she was feeling.  I was unable to provide a call back number since I'm working from home using my mobile phone.  I let her know I would try to contact her again tomorrow, even though our office is closed due to inclement weather.

## 2017-09-23 NOTE — Telephone Encounter (Signed)
Spoke to patient - she is aware of lab results.  She has not felt an improvement in her body aches with the Prednisone yet but she only started the medication two days ago.  She will continue taking it and call us to provide an update next Monday on how she is feeling.  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

## 2017-10-03 ENCOUNTER — Other Ambulatory Visit: Payer: Self-pay | Admitting: *Deleted

## 2017-10-03 DIAGNOSIS — R799 Abnormal finding of blood chemistry, unspecified: Secondary | ICD-10-CM

## 2017-10-03 DIAGNOSIS — R52 Pain, unspecified: Secondary | ICD-10-CM

## 2017-10-03 MED ORDER — PREGABALIN 150 MG PO CAPS: 150.0000 mg | ORAL_CAPSULE | Freq: Three times a day (TID) | ORAL | 2 refills | Status: DC

## 2017-10-03 NOTE — Telephone Encounter (Addendum)
Patient is still having body pains, headaches and tremors despite taking Prednisone 2.'5mg'$  daily.  Per Dr. Krista Blue, complete the following:  1) Repeat C-Reactive Protein, ESR 2) Take Prednisone 2.'5mg'$ , 0.5 tablets for three days then stop 3) Complete MRI brain w/o 4) Increase Lyrica from '75mg'$ , TID to Lyrica '150mg'$ , TID (new rx sent to pharmacy)  She is agreeable to this plan.  Also, her follow up has been moved to an earlier date.

## 2017-10-03 NOTE — Addendum Note (Signed)
Addended by: Noberto Retort C on: 10/03/2017 10:58 AM   Modules accepted: Orders

## 2017-10-03 NOTE — Addendum Note (Signed)
Addended by: Noberto Retort C on: 10/03/2017 10:44 AM   Modules accepted: Orders

## 2017-10-03 NOTE — Telephone Encounter (Signed)
Patient says is not feeling any better taking Prednisone. Please call and discuss because she thinks she might need to be seen.

## 2017-10-17 ENCOUNTER — Ambulatory Visit
Admission: RE | Admit: 2017-10-17 | Discharge: 2017-10-17 | Disposition: A | Discharge status: Home / Self Care | Payer: 59 | Source: Ambulatory Visit | Attending: Neurology | Admitting: Neurology

## 2017-10-17 ENCOUNTER — Other Ambulatory Visit: Payer: Self-pay | Admitting: *Deleted

## 2017-10-17 ENCOUNTER — Telehealth: Payer: Self-pay | Admitting: Neurology

## 2017-10-17 DIAGNOSIS — R51 Headache: Principal | ICD-10-CM

## 2017-10-17 DIAGNOSIS — R519 Headache, unspecified: Secondary | ICD-10-CM

## 2017-10-17 MED ORDER — VENLAFAXINE HCL ER 75 MG PO CP24: 75.0000 mg | ORAL_CAPSULE | Freq: Every day | ORAL | 3 refills | Status: DC

## 2017-10-17 NOTE — Telephone Encounter (Signed)
Pt called to advise she is planning on coming in on Tuesday 10/22/17 for labs.  FYI

## 2017-10-21 ENCOUNTER — Telehealth: Payer: Self-pay | Admitting: *Deleted

## 2017-10-21 NOTE — Telephone Encounter (Signed)
-----   Message from Marcial Pacas, MD sent at 10/18/2017 11:42 AM EST ----- Please call pt for normal MRI brain.

## 2017-10-21 NOTE — Telephone Encounter (Signed)
Left patient a detailed message, with results, on voicemail (ok per DPR).  Provided our number to call back with any questions.  

## 2017-10-22 ENCOUNTER — Other Ambulatory Visit (INDEPENDENT_AMBULATORY_CARE_PROVIDER_SITE_OTHER): Payer: Self-pay

## 2017-10-22 DIAGNOSIS — R52 Pain, unspecified: Secondary | ICD-10-CM

## 2017-10-22 DIAGNOSIS — Z0289 Encounter for other administrative examinations: Secondary | ICD-10-CM

## 2017-10-22 DIAGNOSIS — R799 Abnormal finding of blood chemistry, unspecified: Secondary | ICD-10-CM

## 2017-10-23 ENCOUNTER — Telehealth: Payer: Self-pay | Admitting: Neurology

## 2017-10-23 LAB — SEDIMENTATION RATE: Sed Rate: 24 mm/hr (ref 0–40)

## 2017-10-23 LAB — C-REACTIVE PROTEIN: CRP: 19.4 mg/L — AB (ref 0.0–4.9)

## 2017-10-23 NOTE — Telephone Encounter (Signed)
Pt states she was just treated with Zithomax for an upper respiratory infection (which could also explain her lab result).  Says her body pain improved with Zithromax but she did not see any improvement with the Prednisone.  Dr. Krista Blue has reviewed her chart and would like her to follow up with her PCP.  No neurological problem uncovered at this time.  Says she is tired a lot and sleeps too much.  She has a full physical scheduled with her PCP in April and plans to address this problem with him.  She verbalized appreciation and will call us if she has any other questions or concerns.

## 2017-10-23 NOTE — Telephone Encounter (Signed)
Please call patient, repeat laboratory evaluation continues to show elevated C-reactive protein 19.4,    Please check with her the steroid dosage, and how she responds to prednisone

## 2017-10-29 ENCOUNTER — Other Ambulatory Visit: Payer: Self-pay | Admitting: Neurology

## 2017-11-18 ENCOUNTER — Ambulatory Visit: Payer: Self-pay | Admitting: Neurology

## 2017-11-21 ENCOUNTER — Ambulatory Visit (INDEPENDENT_AMBULATORY_CARE_PROVIDER_SITE_OTHER): Payer: 59 | Admitting: Otolaryngology

## 2017-11-21 DIAGNOSIS — K1230 Oral mucositis (ulcerative), unspecified: Secondary | ICD-10-CM | POA: Diagnosis not present

## 2017-11-21 DIAGNOSIS — K219 Gastro-esophageal reflux disease without esophagitis: Secondary | ICD-10-CM

## 2017-12-03 DIAGNOSIS — F418 Other specified anxiety disorders: Secondary | ICD-10-CM | POA: Insufficient documentation

## 2017-12-23 ENCOUNTER — Ambulatory Visit: Payer: Self-pay | Admitting: Neurology

## 2018-06-15 HISTORY — PX: COLONOSCOPY: SHX174

## 2019-06-30 ENCOUNTER — Other Ambulatory Visit: Payer: Self-pay | Admitting: Gastroenterology

## 2019-06-30 ENCOUNTER — Ambulatory Visit
Admission: RE | Admit: 2019-06-30 | Discharge: 2019-06-30 | Disposition: A | Discharge status: Home / Self Care | Payer: 59 | Source: Ambulatory Visit | Attending: Gastroenterology | Admitting: Gastroenterology

## 2019-06-30 ENCOUNTER — Other Ambulatory Visit: Payer: Self-pay

## 2019-06-30 DIAGNOSIS — R140 Abdominal distension (gaseous): Secondary | ICD-10-CM

## 2019-06-30 DIAGNOSIS — R109 Unspecified abdominal pain: Secondary | ICD-10-CM

## 2019-07-02 ENCOUNTER — Encounter: Payer: Self-pay | Admitting: Internal Medicine

## 2019-07-10 ENCOUNTER — Other Ambulatory Visit (HOSPITAL_COMMUNITY)
Admission: RE | Admit: 2019-07-10 | Discharge: 2019-07-10 | Disposition: A | Discharge status: Home / Self Care | Payer: 59 | Source: Ambulatory Visit | Attending: Gastroenterology | Admitting: Gastroenterology

## 2019-07-10 ENCOUNTER — Other Ambulatory Visit: Payer: Self-pay

## 2019-07-10 ENCOUNTER — Ambulatory Visit (INDEPENDENT_AMBULATORY_CARE_PROVIDER_SITE_OTHER): Payer: 59 | Admitting: Gastroenterology

## 2019-07-10 ENCOUNTER — Encounter: Payer: Self-pay | Admitting: Gastroenterology

## 2019-07-10 ENCOUNTER — Telehealth: Payer: Self-pay | Admitting: *Deleted

## 2019-07-10 VITALS — BP 120/71 | HR 92 | Temp 96.8°F | Ht 62.0 in | Wt 167.2 lb

## 2019-07-10 DIAGNOSIS — R194 Change in bowel habit: Secondary | ICD-10-CM | POA: Insufficient documentation

## 2019-07-10 DIAGNOSIS — R198 Other specified symptoms and signs involving the digestive system and abdomen: Secondary | ICD-10-CM | POA: Diagnosis not present

## 2019-07-10 DIAGNOSIS — R1084 Generalized abdominal pain: Secondary | ICD-10-CM

## 2019-07-10 DIAGNOSIS — R140 Abdominal distension (gaseous): Secondary | ICD-10-CM | POA: Diagnosis not present

## 2019-07-10 DIAGNOSIS — R109 Unspecified abdominal pain: Secondary | ICD-10-CM | POA: Insufficient documentation

## 2019-07-10 LAB — CBC WITH DIFFERENTIAL/PLATELET
Abs Immature Granulocytes: 0.03 10*3/uL (ref 0.00–0.07)
Basophils Absolute: 0.1 10*3/uL (ref 0.0–0.1)
Basophils Relative: 1 %
Eosinophils Absolute: 0.6 10*3/uL — ABNORMAL HIGH (ref 0.0–0.5)
Eosinophils Relative: 6 %
HCT: 46.7 % — ABNORMAL HIGH (ref 36.0–46.0)
Hemoglobin: 15.4 g/dL — ABNORMAL HIGH (ref 12.0–15.0)
Immature Granulocytes: 0 %
Lymphocytes Relative: 25 %
Lymphs Abs: 2.4 10*3/uL (ref 0.7–4.0)
MCH: 31.7 pg (ref 26.0–34.0)
MCHC: 33 g/dL (ref 30.0–36.0)
MCV: 96.1 fL (ref 80.0–100.0)
Monocytes Absolute: 0.7 10*3/uL (ref 0.1–1.0)
Monocytes Relative: 7 %
Neutro Abs: 6 10*3/uL (ref 1.7–7.7)
Neutrophils Relative %: 61 %
Platelets: 266 10*3/uL (ref 150–400)
RBC: 4.86 MIL/uL (ref 3.87–5.11)
RDW: 14 % (ref 11.5–15.5)
WBC: 9.7 10*3/uL (ref 4.0–10.5)
nRBC: 0 % (ref 0.0–0.2)

## 2019-07-10 LAB — COMPREHENSIVE METABOLIC PANEL
ALT: 15 U/L (ref 0–44)
AST: 14 U/L — ABNORMAL LOW (ref 15–41)
Albumin: 3.8 g/dL (ref 3.5–5.0)
Alkaline Phosphatase: 83 U/L (ref 38–126)
Anion gap: 9 (ref 5–15)
BUN: 16 mg/dL (ref 6–20)
CO2: 29 mmol/L (ref 22–32)
Calcium: 9.3 mg/dL (ref 8.9–10.3)
Chloride: 100 mmol/L (ref 98–111)
Creatinine, Ser: 0.71 mg/dL (ref 0.44–1.00)
GFR calc Af Amer: >60 mL/min (ref >60)
GFR calc non Af Amer: >60 mL/min (ref >60)
Glucose, Bld: 106 mg/dL — ABNORMAL HIGH (ref 70–99)
Potassium: 4.1 mmol/L (ref 3.5–5.1)
Sodium: 138 mmol/L (ref 135–145)
Total Bilirubin: 0.3 mg/dL (ref 0.3–1.2)
Total Protein: 7.1 g/dL (ref 6.5–8.1)

## 2019-07-10 LAB — T4, FREE: Free T4: 0.69 ng/dL (ref 0.61–1.12)

## 2019-07-10 LAB — LIPASE, BLOOD: Lipase: 28 U/L (ref 11–51)

## 2019-07-10 LAB — TSH: TSH: 1.025 u[IU]/mL (ref 0.350–4.500)

## 2019-07-10 MED ORDER — LUBIPROSTONE 8 MCG PO CAPS: 8.0000 ug | ORAL_CAPSULE | Freq: Two times a day (BID) | ORAL | 1 refills | Status: DC

## 2019-07-10 NOTE — Telephone Encounter (Signed)
Clinicals faxed

## 2019-07-10 NOTE — Progress Notes (Signed)
Primary Care Physician:  Neale Burly, MD  Primary Gastroenterologist:  Garfield Cornea, MD   Chief Complaint  Patient presents with  . Constipation  . Diarrhea    HPI:  Jillian [name] is a 53 y.o. female here at the request of Dr. Sherrie Sport for further evaluation of abdominal pain, change in bowel habits.  Patient has a long GI history.  In 1982 she required surgery for dilated ileum, describes removal of the ileum and part of the right colon.  There was concern for possibility of Crohn's disease but she states she was never officially given the diagnosis.  She has had bowel issues ever since that time.  Chronically with history of postprandial fecal urgency with diarrhea.  Symptoms significant enough that she would avoid food while at work.  Tends to eat evening meal only.  In the past she tried Bentyl without any relief.  In 2015 she had a colonoscopy that showed mild diverticulosis in the ascending colon and sigmoid colon, evidence of prior ileocolonic surgical anastomosis.  No evidence of Crohn's disease at that time.  Because of progressive symptoms over the years she establish GI care with Dr. Therisa Doyne at [place] about a year ago.  She states she had another colonoscopy, we requested those records.  She was placed on Colestid which seemed to help with her diarrhea.  May take 1 g 2-3 times daily to control her symptoms.  Recently she noticed abrupt change in her bowel habits.  Now she is having abdominal distention, "fills up with air".  Feels like she is going to pop.  The last 2 weeks she has been significantly uncomfortable to the point she was not able to work. She is a Development worker, community carrier.  Every time she would hit a bump she would have excruciating abdominal pain.  She has been out of work for 3 days.  Denies fever.  Has to be really still to get relief.  If she is able to belch or pass some gas she gets some mild relief in her discomfort.  She stopped Colestid over 2 weeks ago.  She called her  previous GI who ordered an abdominal x-ray which was unremarkable.  She saw her PCP who gave her Linzess but after 1 pill she developed worsening abdominal pain.  She tried a couple of Dulcolax but does not get any significant stool.  She has been waking up with sour taste in her mouth.  Trying to eat bland food only.  Avoiding dairy.  Avoids tomato based foods because of reflux and diarrhea.  Taking 2-3 Goody's daily for aches and pains.  Has been on Goody's for years.  Most recent antibiotics, amoxicillin and Keflex back in July.     Current Outpatient Medications  Medication Sig Dispense Refill  . Aspirin-Acetaminophen-Caffeine (GOODY HEADACHE PO) Take by mouth as needed.    . colestipol (COLESTID) 1 g tablet Take 1 tablet by mouth as needed.    . DULoxetine (CYMBALTA) 60 MG capsule Take 60 mg by mouth daily.    . fluticasone-salmeterol (ADVAIR HFA) 115-21 MCG/ACT inhaler Inhale 2 puffs into the lungs 2 (two) times daily.    Marland Kitchen loratadine (CLARITIN) 10 MG tablet Take 10 mg by mouth daily.    . mometasone (NASONEX) 50 MCG/ACT nasal spray Place 1 spray into the nose as needed.    . Omega-3 Fatty Acids ([employer] FISH OIL) 1000 MG CAPS Take 1 capsule by mouth 3 (three) times daily.    . pantoprazole (  PROTONIX) 40 MG tablet Take 1 tablet by mouth 2 (two) times daily.    Marland Kitchen tiotropium (SPIRIVA) 18 MCG inhalation capsule Place 18 mcg into inhaler and inhale daily.    . valsartan-hydrochlorothiazide (DIOVAN-HCT) 160-12.5 MG tablet Take 1 tablet by mouth daily.    . vitamin B-12 (CYANOCOBALAMIN) 1000 MCG tablet Take 1,000 mcg by mouth daily.    . Vitamin D, Ergocalciferol, (DRISDOL) 1.25 MG (50000 UT) CAPS capsule Take 1 capsule by mouth once a week.     No current facility-administered medications for this visit.     Allergies as of 07/10/2019 - Review Complete 07/10/2019  Allergen Reaction Noted  . Banana Hives 07/10/2019  . Doxycycline  08/23/2016    Past Medical History:  Diagnosis Date  .  Anxiety   . Arthritis   . Asthma   . Depression   . High blood pressure   . History of bladder problems   . Migraines   . Sleep apnea     Past Surgical History:  Procedure Laterality Date  . APPENDECTOMY    . BLADDER SURGERY     x3  . ileum resection  1982  . LAPAROSCOPIC CHOLECYSTECTOMY    . PARTIAL HYSTERECTOMY      Family History  Problem Relation Age of Onset  . Diabetes Mother   . COPD Mother   . Heart attack Father   . Diabetes Sister   . Colon cancer Neg Hx   . Celiac disease Neg Hx   . Inflammatory bowel disease Neg Hx     Social History   Socioeconomic History  . Marital status: Divorced    Spouse name: Not on file  . Number of children: Not on file  . Years of education: Not on file  . Highest education level: Not on file  Occupational History  . Not on file  Social Needs  . Financial resource strain: Not on file  . Food insecurity    Worry: Not on file    Inability: Not on file  . Transportation needs    Medical: Not on file    Non-medical: Not on file  Tobacco Use  . Smoking status: Current Every Day Smoker    Types: Cigarettes  . Smokeless tobacco: Never Used  Substance and Sexual Activity  . Alcohol use: Yes    Comment: hardly ever  . Drug use: No  . Sexual activity: Not on file  Lifestyle  . Physical activity    Days per week: Not on file    Minutes per session: Not on file  . Stress: Not on file  Relationships  . Social Herbalist on phone: Not on file    Gets together: Not on file    Attends religious service: Not on file    Active member of club or organization: Not on file    Attends meetings of clubs or organizations: Not on file    Relationship status: Not on file  . Intimate partner violence    Fear of current or ex partner: Not on file    Emotionally abused: Not on file    Physically abused: Not on file    Forced sexual activity: Not on file  Other Topics Concern  . Not on file  Social History Narrative  .  Not on file      ROS:  General: Negative for   weight loss, fever, chills, fatigue, weakness.  Poor appetite Eyes: Negative for vision changes.  ENT: Negative  for hoarseness, difficulty swallowing , nasal congestion. CV: Negative for chest pain, angina, palpitations, dyspnea on exertion, peripheral edema.  Respiratory: Negative for dyspnea at rest, dyspnea on exertion, cough, sputum, wheezing.  GI: See history of present illness. GU:  Negative for dysuria, hematuria, urinary incontinence, urinary frequency, nocturnal urination.  MS: Negative for joint pain, low back pain.  Derm: Negative for rash or itching.  Neuro: Negative for weakness, abnormal sensation, seizure, frequent headaches, memory loss, confusion.  Psych: Negative for anxiety, depression, suicidal ideation, hallucinations.  Endo: Negative for unusual weight change.  Heme: Negative for bruising or bleeding. Allergy: Negative for rash or hives.    Physical Examination:  BP 120/71   Pulse 92   Temp (!) 96.8 F (36 C) (Temporal)   Ht 5\' 2"  (1.575 m)   Wt 167 lb 3.2 oz (75.8 kg)   BMI 30.58 kg/m    General: Well-nourished, well-developed in no acute distress.  Appears uncomfortable. Head: Normocephalic, atraumatic.   Eyes: Conjunctiva pink, no icterus. Mouth: Oropharyngeal mucosa moist and pink , no lesions erythema or exudate. Neck: Supple without thyromegaly, masses, or lymphadenopathy.  Lungs: Clear to auscultation bilaterally.  Heart: Regular rate and rhythm, no murmurs rubs or gallops.  Abdomen: Bowel sounds are normal, diffuse tenderness with palpation especially upper abdomen, some distention,  no hepatosplenomegaly or masses, no abdominal bruits or    hernia , no rebound or guarding.   Rectal: not performed Extremities: No lower extremity edema. No clubbing or deformities.  Neuro: Alert and oriented x 4 , grossly normal neurologically.  Skin: Warm and dry, no rash or jaundice.   Psych: Alert and  cooperative, normal mood and affect.    Imaging Studies: Dg Abd 2 Views  Result Date: 06/30/2019 CLINICAL DATA:  Abdominal pain for 2 weeks EXAM: ABDOMEN - 2 VIEW COMPARISON:  None. FINDINGS: Scattered large and small bowel gas is noted. No obstructive changes are seen. No free air is noted. Postsurgical changes are seen consistent with cholecystectomy and prior appendectomy. No abnormal mass or abnormal calcifications are seen. No acute bony abnormality is noted. IMPRESSION: No acute abnormality noted. Electronically Signed   By: Inez Catalina M.D.   On: 06/30/2019 20:31

## 2019-07-10 NOTE — Telephone Encounter (Signed)
PA submited via [employer] website. It requires further review and will need to fax clinicals. Service order# [id number]

## 2019-07-10 NOTE — Patient Instructions (Addendum)
CT as scheduled.   Go for labs today at [facility].  Try Amitiza 26mcg twice daily with food to have productive bowel movement. Hold if diarrhea. Samples provided. RX sent to pharmacy.  We will obtain records for review.

## 2019-07-10 NOTE — Assessment & Plan Note (Addendum)
Pleasant 53 year old female with remote surgery involving removal of dilated ileum and part of her right colon in 1982.  Never received diagnosis of Crohn's although it was discussed as a possibility.  She has had chronic bowel issues with postprandial abdominal pain with fecal urgency and diarrhea for years.  We have requested her most recent colonoscopy done within the past 2 years.  Patient states there were no significant findings.  She was placed on Colestid which was managing her diarrhea and actually created some constipation.  She stopped Colestid about 2 weeks ago when she developed new change in symptoms including abdominal distention, generalized abdominal pain, inability to have a bowel movement.  She is taken a couple of over-the-counter laxatives, Linzess without any significant relief.  She describes intense abdominal pain this past week with working, peritoneal type signs.  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  Recommend CT abdomen pelvis with contrast stat to rule out bowel obstruction or perforation.  Differential also includes inflammatory bowel disease or complicated diverticulitis.  Once CT obtained assuming no evidence of obstruction etc., will start Amitiza 8 mcg twice daily with food until she gets return of adequate bowel function.  Will also obtain stat labs today.  Work excuse note provided,  Obtain copy of records from Dr. Therisa Doyne.

## 2019-07-11 LAB — TISSUE TRANSGLUTAMINASE, IGA: Tissue Transglutaminase Ab, IgA: <2 U/mL (ref 0–3)

## 2019-07-11 LAB — IGA: IgA: 228 mg/dL (ref 87–352)

## 2019-07-13 ENCOUNTER — Ambulatory Visit (HOSPITAL_COMMUNITY)
Admission: RE | Admit: 2019-07-13 | Discharge: 2019-07-13 | Disposition: A | Discharge status: Home / Self Care | Payer: 59 | Source: Ambulatory Visit | Attending: Gastroenterology | Admitting: Gastroenterology

## 2019-07-13 ENCOUNTER — Other Ambulatory Visit: Payer: Self-pay

## 2019-07-13 ENCOUNTER — Telehealth: Payer: Self-pay | Admitting: *Deleted

## 2019-07-13 DIAGNOSIS — R140 Abdominal distension (gaseous): Secondary | ICD-10-CM

## 2019-07-13 DIAGNOSIS — R194 Change in bowel habit: Secondary | ICD-10-CM | POA: Insufficient documentation

## 2019-07-13 MED ORDER — IOHEXOL 300 MG/ML  SOLN
30.0000 mL | Freq: Once | INTRAMUSCULAR | Status: AC | PRN
  Administered 2019-07-13: 30 mL via ORAL

## 2019-07-13 MED ORDER — LUBIPROSTONE 8 MCG PO CAPS: 8.0000 ug | ORAL_CAPSULE | Freq: Two times a day (BID) | ORAL | 3 refills | Status: DC

## 2019-07-13 MED ORDER — IOHEXOL 300 MG/ML  SOLN
100.0000 mL | Freq: Once | INTRAMUSCULAR | Status: AC | PRN
  Administered 2019-07-13: 100 mL via INTRAVENOUS

## 2019-07-13 NOTE — Telephone Encounter (Signed)
done

## 2019-07-13 NOTE — Telephone Encounter (Signed)
Patient called requesting amitiza sent in for 90 day supply as this is cheaper for her. It has helped with her bloating but still not having a BM. Took laxative and it helped very little. Patient aware we are still awaiting approval for CT. Once approved, I will call to schedule. I called [employer] and was advised once a decision is made I will be informed. I advised this is for a STAT CT and they were aware of this. Fowarding to LSL.

## 2019-07-13 NOTE — Telephone Encounter (Signed)
Checked PA status and it is still pending

## 2019-07-13 NOTE — Addendum Note (Signed)
Addended by: Mahala Menghini on: 07/13/2019 04:27 PM   Modules accepted: Orders

## 2019-07-13 NOTE — Telephone Encounter (Signed)
Auth# [id number].  Patient is headed to [facility] now for CT

## 2019-07-13 NOTE — Telephone Encounter (Signed)
PA approved. Case ID# [id number] Dates 07/10/2019-01/06/2020

## 2019-07-21 ENCOUNTER — Ambulatory Visit: Payer: 59 | Admitting: Gastroenterology

## 2019-07-27 NOTE — Progress Notes (Signed)
On recall  °

## 2019-08-17 ENCOUNTER — Encounter: Payer: Self-pay | Admitting: Gastroenterology

## 2019-08-17 ENCOUNTER — Telehealth: Payer: Self-pay | Admitting: Gastroenterology

## 2019-08-17 NOTE — Telephone Encounter (Signed)
Reviewed records from Dr. Therisa Doyne:  Patient last seen May 2020 via virtual visit for GERD and IBS.  Colonoscopy September 2019: Normal terminal ileum, biopsies unremarkable.  Evidence of prior end to side ileocolonic anastomosis in the cecum.  This was patent and was characterized by healthy-appearing mucosa.  Multiple small and large mouth diverticula noted in the sigmoid colon and descending colon.  Random colon biopsies were benign.  Recommended 10-year repeat colonoscopy.  Please let patient know that I was able to review [facility] records including September 2019 colonoscopy.  Let us plan for routine follow-up in 3 months.

## 2019-08-18 ENCOUNTER — Encounter: Payer: Self-pay | Admitting: Internal Medicine

## 2019-08-18 NOTE — Telephone Encounter (Signed)
PATIENT SCHEDULED  °

## 2019-08-18 NOTE — Telephone Encounter (Signed)
Left a detailed message on pt's machine. Pt is aware that LSL, has reviewed her GI records. Please schedule a f/u in 3 months per LSL.

## 2019-09-01 ENCOUNTER — Telehealth: Payer: Self-pay | Admitting: Internal Medicine

## 2019-09-01 NOTE — Telephone Encounter (Signed)
Recall sent 

## 2019-09-01 NOTE — Telephone Encounter (Signed)
Recall for ct chest °

## 2019-09-07 ENCOUNTER — Telehealth: Payer: Self-pay | Admitting: Internal Medicine

## 2019-09-07 DIAGNOSIS — R911 Solitary pulmonary nodule: Secondary | ICD-10-CM

## 2019-09-07 NOTE — Telephone Encounter (Signed)
LSL please advise if patient CT chest is w/ or w/o contrast to f/u pulmonary nodule? Thanks

## 2019-09-07 NOTE — Telephone Encounter (Signed)
[phone] PATIENT RECEIVED LETTER TO SCHEDULE HER CT

## 2019-09-08 NOTE — Telephone Encounter (Signed)
CT chest scheduled for 12/16 at 2:00pm arrival 1:45pm.  Called pt and she is aware of appt details. She voiced understanding

## 2019-09-08 NOTE — Addendum Note (Signed)
Addended by: Cheron Every on: 09/08/2019 07:47 AM   Modules accepted: Orders

## 2019-09-08 NOTE — Telephone Encounter (Signed)
Without contrast

## 2019-09-08 NOTE — Telephone Encounter (Signed)
PA pending for CT chest via [employer] website. Tracking# [id number]. Clinicals uploaded

## 2019-09-15 NOTE — Telephone Encounter (Signed)
PA approved auth# [id number] dates 09/08/2019-03/06/2020

## 2019-09-30 ENCOUNTER — Other Ambulatory Visit: Payer: Self-pay

## 2019-09-30 ENCOUNTER — Ambulatory Visit (HOSPITAL_COMMUNITY)
Admission: RE | Admit: 2019-09-30 | Discharge: 2019-09-30 | Disposition: A | Discharge status: Home / Self Care | Payer: 59 | Source: Ambulatory Visit | Attending: Gastroenterology | Admitting: Gastroenterology

## 2019-09-30 DIAGNOSIS — R911 Solitary pulmonary nodule: Secondary | ICD-10-CM | POA: Insufficient documentation

## 2019-10-12 ENCOUNTER — Other Ambulatory Visit: Payer: Self-pay

## 2019-10-12 ENCOUNTER — Telehealth: Payer: Self-pay | Admitting: Internal Medicine

## 2019-10-12 DIAGNOSIS — R911 Solitary pulmonary nodule: Secondary | ICD-10-CM

## 2019-10-12 NOTE — Progress Notes (Signed)
am

## 2019-10-12 NOTE — Telephone Encounter (Signed)
AM spoke with pt and notified her of results.

## 2019-10-12 NOTE — Telephone Encounter (Signed)
Pt said she was returning a call from AS. [phone]

## 2019-10-22 ENCOUNTER — Telehealth: Payer: Self-pay | Admitting: Internal Medicine

## 2019-10-22 NOTE — Telephone Encounter (Signed)
[phone]  PATIENT SAID A FEW WEEKS AGO WE WERE GOING TO REFER HER TO A PULMONOLOGIST.  SHE HAS NOT HEARD YET ABOUT AN APPOINTMENT.  SHE WAS TOLD THAT IF SHE DID NOT HEAR IN A FEW WEEKS TO LET us KNOW

## 2019-10-22 NOTE — Telephone Encounter (Signed)
Called patient and provided # to [facility] to call for appt. Referral in epic.

## 2019-10-29 ENCOUNTER — Institutional Professional Consult (permissible substitution): Payer: 59 | Admitting: Critical Care Medicine

## 2019-11-11 ENCOUNTER — Institutional Professional Consult (permissible substitution): Payer: 59 | Admitting: Critical Care Medicine

## 2019-11-18 ENCOUNTER — Ambulatory Visit: Payer: 59 | Admitting: Gastroenterology

## 2019-11-18 ENCOUNTER — Telehealth: Payer: Self-pay | Admitting: Critical Care Medicine

## 2019-11-18 DIAGNOSIS — Z20822 Contact with and (suspected) exposure to covid-19: Secondary | ICD-10-CM

## 2019-11-18 NOTE — Telephone Encounter (Signed)
If she thinks she needs to be tested for covid, she shouldn't be coming to clinic. I am happy to help get her set up for covid testing and she can follow up with [name] once that concern is taken care of. If she thinks she may have covid and needs to be  seen sooner, the respiratory clinic may be an option short-term for acute things.  LPC

## 2019-11-18 NOTE — Telephone Encounter (Signed)
pt has pt tomorrow and states that when answering COVID ?'s she started thinking abou the symptoms and called back - states that she is being treated for bronchitis, has diaherra, tightness in chest, SOB, low energy - please advise if we need to cancel appt as it is a consultation - Dr. Treating bronchitis states that it is not COVID - only bronchitis but did not test -please advise today CB# [phone]

## 2019-11-18 NOTE — Telephone Encounter (Signed)
Yes, that seems reasonable. Thanks! Will she call back to scheduled an appt based on those results? If it is negative and she has not had a known recent exposure she can come any time after we get that result.  LPC

## 2019-11-18 NOTE — Telephone Encounter (Signed)
Dr. Carlis Abbott,  I called the patient back based on the information forwarded from the front desk. The patient has a consult appointment with you 11/19/19 at 3:30.  However, she stated she has tried to get her PCP to get her tested for covid and he will not order the test. He has cleared her to go back to work (at [employer]) and she is not feeling any better.   Patient stated she still smokes. Has IBS and fibromyalgia, so it is very difficult to tell the difference between the diarrhea and pain she has compared to covid symptoms.  She was previously scheduled to see you for a consult on 10/29/19 and 11/11/19, both were cancelled.  Patient stated that if you were able to issue a note stating she needs to be tested for covid, her work would accept it.

## 2019-11-18 NOTE — Telephone Encounter (Signed)
Dr. Carlis Abbott,  I called the patient and advised her of your recommendation and the test has been ordered. She will be going to [facility] tomorrow to be tested.  The patient was already scheduled to be off of work tomorrow for the consult appointment (that was cancelled). However, she will need a letter to state she was out of work on Friday waiting for the test results.  I suggested to her that since it will take about 48 hours to get the results, that she have her family (who live with her) to take a picture of her scheduled covid appointment for tomorrow so it can be provided to her employer in the meantime.  I suggested that a letter be provided once the results have been received. If a positive result then that duration of time will need to be included in the letter sent to the employer. The patient agreed.

## 2019-11-19 ENCOUNTER — Other Ambulatory Visit: Payer: Self-pay

## 2019-11-19 ENCOUNTER — Ambulatory Visit: Payer: 59 | Attending: Internal Medicine

## 2019-11-19 ENCOUNTER — Institutional Professional Consult (permissible substitution): Payer: 59 | Admitting: Critical Care Medicine

## 2019-11-19 DIAGNOSIS — Z20822 Contact with and (suspected) exposure to covid-19: Secondary | ICD-10-CM

## 2019-11-19 NOTE — Telephone Encounter (Signed)
Called and spoke to pt. Informed her of the recs per Dr. Carlis Abbott. Pt states she will call back tomorrow afternoon to see if the results are back. Will keep encounter open until results are back and appt has been made.

## 2019-11-20 ENCOUNTER — Other Ambulatory Visit: Payer: 59

## 2019-11-20 ENCOUNTER — Telehealth: Payer: Self-pay | Admitting: *Deleted

## 2019-11-20 NOTE — Telephone Encounter (Signed)
Pt's son called to inquire about the patient's COVID test results.  (son is on [place])  Advised that pt's results are still pending.  Also informed that since she is active on MyChart, the results will show up in her MyChart when avail., and he is welcome to call back.  Verb. Understanding.

## 2019-11-20 NOTE — Telephone Encounter (Signed)
Calling for COVID results which have not resulted yet. Verbalized understanding.

## 2019-11-21 LAB — NOVEL CORONAVIRUS, NAA: SARS-CoV-2, NAA: NOT DETECTED

## 2019-11-23 ENCOUNTER — Telehealth (INDEPENDENT_AMBULATORY_CARE_PROVIDER_SITE_OTHER): Payer: 59 | Admitting: Acute Care

## 2019-11-23 ENCOUNTER — Inpatient Hospital Stay
Admission: RE | Admit: 2019-11-23 | Discharge: 2019-11-23 | Disposition: A | Discharge status: Home / Self Care | Payer: 59 | Source: Ambulatory Visit

## 2019-11-23 ENCOUNTER — Other Ambulatory Visit: Payer: Self-pay

## 2019-11-23 ENCOUNTER — Encounter: Payer: Self-pay | Admitting: Acute Care

## 2019-11-23 DIAGNOSIS — F17210 Nicotine dependence, cigarettes, uncomplicated: Secondary | ICD-10-CM | POA: Diagnosis not present

## 2019-11-23 DIAGNOSIS — R0609 Other forms of dyspnea: Secondary | ICD-10-CM

## 2019-11-23 DIAGNOSIS — R0600 Dyspnea, unspecified: Secondary | ICD-10-CM | POA: Diagnosis not present

## 2019-11-23 DIAGNOSIS — F17200 Nicotine dependence, unspecified, uncomplicated: Secondary | ICD-10-CM

## 2019-11-23 NOTE — Patient Instructions (Signed)
It was good to speak with you today We will refer you to the [place] will get annual CT Chest starting 09/2020. You will be required to do a Shared Decision Making visit before your first scan We will call you closer to December to schedule this visit.  CXR with OV at 3 pm 11/25/2019 You will follow up with Dr. Carlis Abbott after this visit.  For symptomatic relief, Robitussin DM for cough Drink plenty of fluids Please see if you can get a thermometer to check your temperature. Please work on trying to smoke fewer cigarettes while you are not feeling well.  Please contact office for sooner follow up if symptoms do not improve or worsen or seek emergency care.

## 2019-11-23 NOTE — Progress Notes (Signed)
Virtual Visit via Video Note  I connected with Jillian [name] on 11/23/19 at 11:30 AM EST by a video enabled telemedicine application and verified that I am speaking with the correct person using two identifiers.  Location: Patient: Home Provider: [place], [place], [place], [address]    I discussed the limitations of evaluation and management by telemedicine and the availability of in person appointments. The patient expressed understanding and agreed to proceed.  Synopsis Pt. Is a current every day smoker with history ( 55 pack year smoking history) of asthma and sleep apnea. She was referred to Dr. Carlis Abbott for pulmonary nodules.   History of Present Illness: Pt. Presents for follow up. She was originally scheduled for a consult with Dr. Carlis Abbott 11/19/2019 for pulmonary nodules.She was referred by her GI physician, as she noted bilateral nodules on CT Chest and  wanted to make sure she the patient  got regular nodule follow up.  She is a current every day smoker. She smokes about 1.5 PPD. She was concerned that she had been exposed to [place], and she did not want to come into the office 11/19/2019. She was tested 2/4, and results were negative. She is following up today as a video visit. . She states she was to have a consult for pulmonary nodules. She said that she had not been given the results of her CT Chest done 09/2019. , just that she needed to follow up with pulmonary. She is very anxious about this  today on the phone. I gave her the results of her CT Chest. I explained that she has pulmonary nodules that are stable in size but need annual surveillance as she is a smoker and in a high risk category for lung cancer. 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 Smoking History    Started smoking at 54 years of age ( 9 years ago ) Max smoking >> 1.5 packs per day x 30 of 45 years>> 45 Packs For the remaining 15 years 1 PPD>> 15>> 15 packs  She did quit x 1 for 2 years, but restarted.>>  minus 2 packs 56 pack year smoking history We discussed referral to the Lung Cancer Screening program, as she qualifies. She is in agreement with this. She will need a SDMV before 09/2020, and scanning 09/2020.  We also discussed smoking cessation. She does not feel she can quit at present.   Pt. Also has new respiratory symptoms that have been present for 3 weeks. . She has been  treated for bronchitis with symptoms of chest tightness , SOB, and  low energy by her PCP.She was treated with a z pack and a prednisone taper which she is finishing up. She states she did not have any significant change in her symptoms with treatment. She does taken Spiriva and Albuterol inhalers for suspected COPD. She has never had PFT's for diagnosis  per the patient.  She endorses Frequent bronchitis. She has shortness of breath and wheezing  with exertion.  She endorses  hot flashes and chills but she does not have a working thermometer. She has been taking mucinex for cough that is productive for yellowish brown secretions. She is continuing to smoke, and states that she is a very anxious person. She is requesting pulmonary manage her pulmonary issues as she feels they have become too complex for her PCP. Jillian [name] She has tried  Wellbutrin in the past to help her quit smoking. She states it worked well, and  that she was able to quit, but it made her very sleepy and she was unable to work while taking it. She has also has tried  nicotrol.    Observations/Objective: 11/19/2019  SARS-CoV-2, NAA Not Detected Not Detected    09/29/2020 CT Chest w/o Contrast Multiple bilateral pulmonary nodules are unchanged from 05/13/2019. Follow-up non-contrast CT in 12-18 months is recommended for high-risk patients.  Assessment and Plan: Pulmonary Nodules on CT Chest in Current Every Day smoker 09/2019 CT Chest with Bilateral stable pulmonary nodules with recommendation for 12 month follow up.  No hemoptysis Plan We will refer  patient to the Lung Cancer Screening Program Will need SDMV before her next scan 09/2020  Shortness of Breath on exertion Cough with yellow brown secretions Chills and Night sweats No complaints of leg pain We will schedule patient for a visit with CXR 11/25/2019  And OV at 3 PM with Judson Roch NP We will evaluate CXR and obtain D dimer >> develop plan of care after physical evaluation of patient. Once she is seen 11/25/2019, we will schedule her with a Pulmonary Physician for a formal Pulmonary Consult>> She will need PFT's , and most likely will need change in her maintenance inhaler Seek emergency care for worsening of symptoms. Robitussin DM for symptomatic relief Drink plenty of fluids Please see if you can get a thermometer to check your temperature. Seek emergency care if symptoms get worse not better.   Tobacco Abuse Current every day smoker 55 pack year smoking history Plan I have spent 3 minutes counseling patient on smoking cessation this visit. Patient verbalizes understanding that  Continuing to  smoking has  negative health consequences including worsening of COPD, risk of lung cancer , stroke and heart disease. Referral to Lung Cancer Screening Program She will get smoking cessation counseling through the Lung Cancer Screening Program      Follow Up Instructions: Follow up appointment 11/25/2019 at 3 pm with CXR prior Please contact office for sooner follow up if symptoms do not improve or worsen or seek emergency care    I discussed the assessment and treatment plan with the patient. The patient was provided an opportunity to ask questions and all were answered. The patient agreed with the plan and demonstrated an understanding of the instructions.   The patient was advised to call back or seek an in-person evaluation if the symptoms worsen or if the condition fails to improve as anticipated.  I provided 30  minutes of non-face-to-face time during this encounter.   Magdalen Spatz, NP 11/23/2019 4:53 PM

## 2019-11-25 ENCOUNTER — Ambulatory Visit (INDEPENDENT_AMBULATORY_CARE_PROVIDER_SITE_OTHER): Payer: 59 | Admitting: Acute Care

## 2019-11-25 ENCOUNTER — Ambulatory Visit: Payer: 59 | Admitting: Gastroenterology

## 2019-11-25 ENCOUNTER — Encounter: Payer: Self-pay | Admitting: Acute Care

## 2019-11-25 ENCOUNTER — Ambulatory Visit (INDEPENDENT_AMBULATORY_CARE_PROVIDER_SITE_OTHER): Payer: 59

## 2019-11-25 ENCOUNTER — Other Ambulatory Visit: Payer: Self-pay

## 2019-11-25 VITALS — BP 104/60 | HR 105 | Temp 97.0°F | Ht 62.0 in | Wt 171.6 lb

## 2019-11-25 DIAGNOSIS — R0609 Other forms of dyspnea: Secondary | ICD-10-CM

## 2019-11-25 DIAGNOSIS — R918 Other nonspecific abnormal finding of lung field: Secondary | ICD-10-CM

## 2019-11-25 DIAGNOSIS — Z720 Tobacco use: Secondary | ICD-10-CM | POA: Diagnosis not present

## 2019-11-25 DIAGNOSIS — G4733 Obstructive sleep apnea (adult) (pediatric): Secondary | ICD-10-CM

## 2019-11-25 DIAGNOSIS — R0600 Dyspnea, unspecified: Secondary | ICD-10-CM

## 2019-11-25 DIAGNOSIS — R079 Chest pain, unspecified: Secondary | ICD-10-CM

## 2019-11-25 LAB — D-DIMER, QUANTITATIVE: D-Dimer, Quant: 0.35 ug{FEU}/mL

## 2019-11-25 NOTE — Patient Instructions (Addendum)
It is good to see you today. CXR shows no acute issues, which is good news. We will draw some labs ( CBC, BMET, BNP, Troponin, d dimer) We will call you with results If labs are negative for issues, we will refer you to cardiology.  Continue Advair and Spiriva as you have been doing. Use Albuterol as needed for shortness of breath or wheezing up to 3 times daily We will schedule PFT's  Follow up with Dr. Carlis Abbott for your formal consult in 4 weeks. She will need evaluation for sleep apnea . ( Previous diagnosis, not using her CPAP) Robitussin DM for symptomatic relief Drink plenty of fluids Consider adding Pepcid 20 mg at bedtime for reflux. Please contact office for sooner follow up if symptoms do not improve or worsen or seek emergency care  Please work on quitting smoking, call 1-800-QUIT NOW. Letter to remain out of work through Friday.

## 2019-11-25 NOTE — Progress Notes (Signed)
History of Present Illness Jillian [name] is a 54 y.o. female current every day smoker with a 55 pack year smoking history with new onset dyspnea. She had been referred to see Dr. Carlis [name], but was unable to make the visit due to COVID symptoms.   Synopsis Pt. Is a current every day smoker  (58 pack year smoking history) with history of asthma and sleep apnea. She was referred to Dr. Carlis [name] for pulmonary nodules.    12/02/2019 Acute OV Pt. Presents for follow up of a video visit on 11/23/2019 for her worsening dyspnea over the prior  3 weeks. She was referred for consult with Dr. Carlis [name] for pulmonary nodules, and dyspnea. She has not yet been seen for consult, but presented for video visit for worsening dyspnea. She states this is a problem that she has had for the last year that has become worse over the last three weeks.  She needed to be seen in the office to obtain CXR and lab work, and vital signs.  She continues to endorse being unable to take a deep breath. Upon presentation to the office her sats were 95% on RA. She is afebrile. BP is a bit low>> she states she takes medications for high BP. She is tachycardic. She gives a history of  sub sternal CP for 2 weeks, which she states has currently resolved.  She does have a family history of heart disease  ( her father and grandfather) . She is on Advair and Spiriva as maintenance.  She uses albuterol as rescue. A few times daily. She denies any swelling in her feet or ankles She  states does snore at night  She has a CPAP machine but she does not use it.  She states she did have a cough, which is now a bit better. She states it is worse in the morning and that secretions are average, white to clear.She denies fever.  She states she does have reflux, she does not take anything consistently for this.    Test Results:  CXR 11/25/2019 Lungs are hyperexpanded with mild prominence of the bronchovascular markings, unchanged from the prior study. No  evidence of pneumonia or pulmonary edema. No pleural effusion or pneumothorax.  Skeletal structures are unremarkable.  IMPRESSION: 1. No acute cardiopulmonary disease. 2. COPD.  Stable appearance from the prior study.  11/19/2019  SARS-CoV-2, NAA Not Detected Not Detected     CBC Latest Ref Rng & Units 11/25/2019 07/10/2019 08/22/2017  WBC 4.0 - 10.5 K/uL 13.0(H) 9.7 11.3(H)  Hemoglobin 12.0 - 15.0 g/dL 15.5(H) 15.4(H) 14.9  Hematocrit 36.0 - 46.0 % 47.4(H) 46.7(H) 44.5  Platelets 150.0 - 400.0 K/uL 313.0 266 -    BMP Latest Ref Rng & Units 11/25/2019 07/10/2019 08/22/2017  Glucose 70 - 99 mg/dL 98 106(H) 95  BUN 6 - 23 mg/dL 15 16 16   Creatinine 0.40 - 1.20 mg/dL 0.86 0.71 0.74  BUN/Creat Ratio 9 - 23 - - 22  Sodium 135 - 145 mEq/L 136 138 141  Potassium 3.5 - 5.1 mEq/L 5.0 4.1 4.2  Chloride 96 - 112 mEq/L 98 100 101  CO2 19 - 32 mEq/L 31 29 25   Calcium 8.4 - 10.5 mg/dL 9.8 9.3 9.3    BNP No results found for: BNP  ProBNP    Component Value Date/Time   PROBNP 49.0 11/25/2019 1540    PFT No results found for: FEV1PRE, FEV1POST, FVCPRE, FVCPOST, TLC, DLCOUNC, PREFEV1FVCRT, PSTFEV1FVCRT  DG Chest 2 View  Result  Date: 11/25/2019 CLINICAL DATA:  Productive cough. Dyspnea on exertion for 3 weeks. History of asthma. EXAM: CHEST - 2 VIEW COMPARISON:  07/30/2019. FINDINGS: Cardiac silhouette is normal in size. No mediastinal or hilar masses. No evidence of adenopathy. Lungs are hyperexpanded with mild prominence of the bronchovascular markings, unchanged from the prior study. No evidence of pneumonia or pulmonary edema. No pleural effusion or pneumothorax. Skeletal structures are unremarkable. IMPRESSION: 1. No acute cardiopulmonary disease. 2. COPD.  Stable appearance from the prior study. Electronically Signed   By: Jillian [name] M.D.   On: 11/25/2019 14:55     Past medical hx Past Medical History:  Diagnosis Date  . Anxiety   . Arthritis   . Asthma   . Depression   .  High blood pressure   . History of bladder problems   . Migraines   . Sleep apnea      Social History   Tobacco Use  . Smoking status: Current Every Day Smoker    Packs/day: 1.00    Years: 55.00    Pack years: 55.00    Types: Cigarettes  . Smokeless tobacco: Never Used  Substance Use Topics  . Alcohol use: Yes    Comment: hardly ever  . Drug use: No    Jillian [name] reports that she has been smoking cigarettes. She has a 55.00 pack-year smoking history. She has never used smokeless tobacco. She reports current alcohol use. She reports that she does not use drugs.  Tobacco Cessation: Current every day smoker with a 55 pack year smoking history Past surgical hx, Family hx, Social hx all reviewed.  Current Outpatient Medications on File Prior to Visit  Medication Sig  . Aspirin-Acetaminophen-Caffeine (GOODY HEADACHE PO) Take by mouth as needed.  . colestipol (COLESTID) 1 g tablet Take 1 tablet by mouth as needed.  . DULoxetine (CYMBALTA) 60 MG capsule Take 60 mg by mouth daily.  . fluticasone-salmeterol (ADVAIR HFA) 115-21 MCG/ACT inhaler Inhale 2 puffs into the lungs 2 (two) times daily.  Marland Kitchen loratadine (CLARITIN) 10 MG tablet Take 10 mg by mouth daily.  Marland Kitchen lubiprostone (AMITIZA) 8 MCG capsule Take 1 capsule (8 mcg total) by mouth 2 (two) times daily with a meal.  . mometasone (NASONEX) 50 MCG/ACT nasal spray Place 1 spray into the nose as needed.  . Omega-3 Fatty Acids ([employer] FISH OIL) 1000 MG CAPS Take 1 capsule by mouth 3 (three) times daily.  . pantoprazole (PROTONIX) 40 MG tablet Take 1 tablet by mouth 2 (two) times daily.  Marland Kitchen tiotropium (SPIRIVA) 18 MCG inhalation capsule Place 18 mcg into inhaler and inhale daily.  . valsartan-hydrochlorothiazide (DIOVAN-HCT) 160-12.5 MG tablet Take 1 tablet by mouth daily.  . vitamin B-12 (CYANOCOBALAMIN) 1000 MCG tablet Take 1,000 mcg by mouth daily.  . Vitamin D, Ergocalciferol, (DRISDOL) 1.25 MG (50000 UT) CAPS capsule Take 1 capsule by mouth  once a week.   No current facility-administered medications on file prior to visit.     Allergies  Allergen Reactions  . Banana Hives  . Doxycycline     Review Of Systems:  Constitutional:   No  weight loss, night sweats,  Fevers, chills, fatigue, or  lassitude.  HEENT:   No headaches,  Difficulty swallowing,  Tooth/dental problems, or  Sore throat,                No sneezing, itching, ear ache, nasal congestion, post nasal drip,   CV:  + intermittent  chest pain,  No Orthopnea, PND,  swelling in lower extremities, anasarca, dizziness, palpitations, syncope.   GI  No heartburn, indigestion, abdominal pain, nausea, vomiting, diarrhea, change in bowel habits, loss of appetite, bloody stools.   Resp: + shortness of breath with exertion less at rest.  No excess mucus, no productive cough,  No non-productive cough,  No coughing up of blood.  No change in color of mucus.  No wheezing.  No chest wall deformity  Skin: no rash or lesions.  GU: no dysuria, change in color of urine, no urgency or frequency.  No flank pain, no hematuria   MS:  No joint pain or swelling.  No decreased range of motion.  No back pain.  Psych:  No change in mood or affect. No depression or anxiety.  No memory loss.   Vital Signs BP 104/60 (BP Location: Left Arm, Cuff Size: Large)   Pulse (!) 105   Temp (!) 97 F (36.1 C) (Temporal)   Ht 5\' 2"  (1.575 m)   Wt 77.8 kg   SpO2 95%   BMI 31.39 kg/m    Physical Exam:  General- No distress,  A&Ox3, pleasant, anxious ENT: No sinus tenderness, TM clear, pale nasal mucosa, no oral exudate,no post nasal drip, no LAN Cardiac: S1, S2, regular rate and rhythm, no murmur Chest: No wheeze/ rales/ dullness; no accessory muscle use, no nasal flaring, no sternal retractions, diminished per bases, prolonged expiratory phase Abd.: Soft Non-tender, ND, BS +, Body mass index is 31.39 kg/m. Ext: No clubbing cyanosis, edema Neuro:  normal strength, MAE x 4, A&O x 3 Skin:  No rashes, no lesions, warm and dry Psych: normal mood and behavior   Assessment/Plan  Dyspnea Dyspnea in setting of suspected COPD Plan CXR shows no acute issues, which is good news. We will draw some labs ( CBC, BMET, BNP, Troponin, d dimer) We will call you with results If labs are negative for issues, we will refer you to cardiology.  Continue Advair and Spiriva as you have been doing. Use Albuterol as needed for shortness of breath or wheezing up to 3 times daily We will schedule PFT's to determine if you have  airways disease  Follow up with Dr. Carlis [name] for your formal consult 12/23/2019 ( Scheduled)  Robitussin DM for symptomatic relief Drink plenty of fluids Consider adding Pepcid 20 mg at bedtime for reflux. Please contact office for sooner follow up if symptoms do not improve or worsen or seek emergency care  Letter to remain out of work through Friday. Please contact office for sooner follow up if symptoms do not improve or worsen or seek emergency care    Tobacco abuse Please work on quitting smoking, call 1-800-QUIT NOW for nicotine replacement therapy options. I have spent 3 minutes counseling patient on smoking cessation this visit. Patient verbalizes understanding of their  choice to continue smoking has  negative health consequences including worsening of COPD, risk of lung cancer , stroke and heart disease..    OSA (obstructive sleep apnea) Untreated OSA Plan Pt. Will need another sleep study if she is willing to wear her CPAP.  Consider Echo as she is at risk of PAH, which may be contributing to etiology of dyspnea.  Pulmonary nodules Will need 12 month repeat CT Chest for surveillance of pulmonary nodules. Continued smoking cessation counseling Referral to Lung Cancer Screening Program  This appointment was over 40 minutes long with over 50% of the time being direct face to face patient care, assessment , plan of care ,  and follow up,   Magdalen Spatz,  NP 12/02/2019  7:40 PM  Addendum 12/02/2019 Labs 11/25/2019 Troponin HS >> 3 WBC>> 13 HGB >>15 PLTS>> 313,000 Creatinine >>0.86 Na>> 136 K>> 5 BNP >> 49   I called in prednisone taper and z pack for possible COPD flare on 2/11 after reviewing labwork. Additionally she was told to avoid bananas and orange juice.  Pt is scheduled for follow up with me 12/09/19,( follow up labs)  Appointment with cards 12/17/2019, Consult appointment with Dr. Carlis [name] 12/23/2019 and PFT's are scheduled for 01/29/2020  Magdalen Spatz, MSN, AGACNP-BC [place] Pager # [phone] After 4 pm please call [phone] 12/02/2019 7:49 PM

## 2019-11-26 ENCOUNTER — Other Ambulatory Visit: Payer: Self-pay | Admitting: Acute Care

## 2019-11-26 ENCOUNTER — Other Ambulatory Visit: Payer: Self-pay | Admitting: *Deleted

## 2019-11-26 DIAGNOSIS — R079 Chest pain, unspecified: Secondary | ICD-10-CM

## 2019-11-26 DIAGNOSIS — R0609 Other forms of dyspnea: Secondary | ICD-10-CM

## 2019-11-26 LAB — CBC WITH DIFFERENTIAL/PLATELET
Basophils Absolute: 0.2 10*3/uL — ABNORMAL HIGH (ref 0.0–0.1)
Basophils Relative: 1.2 % (ref 0.0–3.0)
Eosinophils Absolute: 1.2 10*3/uL — ABNORMAL HIGH (ref 0.0–0.7)
Eosinophils Relative: 9.4 % — ABNORMAL HIGH (ref 0.0–5.0)
HCT: 47.4 % — ABNORMAL HIGH (ref 36.0–46.0)
Hemoglobin: 15.5 g/dL — ABNORMAL HIGH (ref 12.0–15.0)
Lymphocytes Relative: 20.8 % (ref 12.0–46.0)
Lymphs Abs: 2.7 10*3/uL (ref 0.7–4.0)
MCHC: 32.6 g/dL (ref 30.0–36.0)
MCV: 91.8 fl (ref 78.0–100.0)
Monocytes Absolute: 1.1 10*3/uL — ABNORMAL HIGH (ref 0.1–1.0)
Monocytes Relative: 8.6 % (ref 3.0–12.0)
Neutro Abs: 7.8 10*3/uL — ABNORMAL HIGH (ref 1.4–7.7)
Neutrophils Relative %: 60 % (ref 43.0–77.0)
Platelets: 313 10*3/uL (ref 150.0–400.0)
RBC: 5.17 Mil/uL — ABNORMAL HIGH (ref 3.87–5.11)
RDW: 15.5 % (ref 11.5–15.5)
WBC: 13 10*3/uL — ABNORMAL HIGH (ref 4.0–10.5)

## 2019-11-26 LAB — BASIC METABOLIC PANEL
BUN: 15 mg/dL (ref 6–23)
CO2: 31 mEq/L (ref 19–32)
Calcium: 9.8 mg/dL (ref 8.4–10.5)
Chloride: 98 mEq/L (ref 96–112)
Creatinine, Ser: 0.86 mg/dL (ref 0.40–1.20)
GFR: 68.28 mL/min (ref >60.00)
Glucose, Bld: 98 mg/dL (ref 70–99)
Potassium: 5 mEq/L (ref 3.5–5.1)
Sodium: 136 mEq/L (ref 135–145)

## 2019-11-26 LAB — BRAIN NATRIURETIC PEPTIDE: Pro B Natriuretic peptide (BNP): 49 pg/mL (ref 0.0–100.0)

## 2019-11-26 LAB — TROPONIN I (HIGH SENSITIVITY): High Sens Troponin I: 3 ng/L (ref 2–17)

## 2019-11-26 MED ORDER — AZITHROMYCIN 250 MG PO TABS: ORAL_TABLET | ORAL | 0 refills | Status: DC

## 2019-11-26 MED ORDER — PREDNISONE 10 MG PO TABS: ORAL_TABLET | ORAL | 0 refills | Status: DC

## 2019-11-26 NOTE — Progress Notes (Signed)
Please call patient and let her know her labs look ok with the exception of her WBC and her potassium.  Please send in Z pack , take as directed Please send in pred taper: Prednisone taper; 10 mg tablets: 3 tabs x 2 days, 2 tabs x 2 days 1 tab x 2 days then stop.  Follow up in 1 week with me repeat labs and to see if pred helped.  Place order for PFT;s  No Orange juice or bananas  Lets get a referral sent for cards eval >> within the next 1-2 weeks. Chest pain and family history of CAD. She can see APP initially to get her in early. Please let her know we are referring her. Thanks

## 2019-11-26 NOTE — Progress Notes (Signed)
Please make sure she knows prednisone needs to be taken with breakfast( food), and that it can affect blood sugars if she is a diabetic. Thanks

## 2019-11-26 NOTE — Progress Notes (Signed)
Please call patient and let her know her d dimer was normal. This is good news. Still waiting on the other labs to result. Thanks

## 2019-11-27 ENCOUNTER — Telehealth: Payer: Self-pay | Admitting: Critical Care Medicine

## 2019-11-27 NOTE — Telephone Encounter (Signed)
Spoke with pt, she would like her note extended until Monday. Fax number to employer [phone] [place] or Awanda Mink. I faxed letter. Nothing further is needed.

## 2019-11-30 ENCOUNTER — Telehealth: Payer: Self-pay | Admitting: Acute Care

## 2019-11-30 NOTE — Telephone Encounter (Signed)
atc pt, no answer, no option to leave VM. I do not see where we called patient.

## 2019-12-01 NOTE — Telephone Encounter (Signed)
I called and spoke with the patient and she states that she had a missed called on her phone and she wasn't sure why. I asked if they left a name or anything and she said no. I advised her that I could not find a note or anything that we would have called and she verbalized understanding.

## 2019-12-02 ENCOUNTER — Encounter: Payer: Self-pay | Admitting: Acute Care

## 2019-12-02 DIAGNOSIS — F17210 Nicotine dependence, cigarettes, uncomplicated: Secondary | ICD-10-CM | POA: Insufficient documentation

## 2019-12-02 DIAGNOSIS — R0600 Dyspnea, unspecified: Secondary | ICD-10-CM | POA: Insufficient documentation

## 2019-12-02 DIAGNOSIS — G4733 Obstructive sleep apnea (adult) (pediatric): Secondary | ICD-10-CM | POA: Insufficient documentation

## 2019-12-02 DIAGNOSIS — R079 Chest pain, unspecified: Secondary | ICD-10-CM | POA: Insufficient documentation

## 2019-12-02 DIAGNOSIS — Z720 Tobacco use: Secondary | ICD-10-CM | POA: Insufficient documentation

## 2019-12-02 DIAGNOSIS — R918 Other nonspecific abnormal finding of lung field: Secondary | ICD-10-CM | POA: Insufficient documentation

## 2019-12-02 NOTE — Assessment & Plan Note (Signed)
Will need 12 month repeat CT Chest for surveillance of pulmonary nodules. Continued smoking cessation counseling Referral to [facility]

## 2019-12-02 NOTE — Assessment & Plan Note (Signed)
Untreated OSA Plan Pt. Will need another sleep study if she is willing to wear her CPAP.  Consider Echo as she is at risk of PAH, which may be contributing to etiology of dyspnea.

## 2019-12-02 NOTE — Assessment & Plan Note (Signed)
Dyspnea in setting of suspected COPD Plan CXR shows no acute issues, which is good news. We will draw some labs ( CBC, BMET, BNP, Troponin, d dimer) We will call you with results If labs are negative for issues, we will refer you to cardiology.  Continue Advair and Spiriva as you have been doing. Use Albuterol as needed for shortness of breath or wheezing up to 3 times daily We will schedule PFT's to determine if you have  airways disease  Follow up with Dr. Carlis Abbott for your formal consult 12/23/2019 ( Scheduled)  Robitussin DM for symptomatic relief Drink plenty of fluids Consider adding Pepcid 20 mg at bedtime for reflux. Please contact office for sooner follow up if symptoms do not improve or worsen or seek emergency care  Letter to remain out of work through Friday. Please contact office for sooner follow up if symptoms do not improve or worsen or seek emergency care

## 2019-12-02 NOTE — Assessment & Plan Note (Addendum)
Please work on quitting smoking, call 1-800-QUIT NOW for nicotine replacement therapy options. I have spent 3 minutes counseling patient on smoking cessation this visit. Patient verbalizes understanding of their  choice to continue smoking has  negative health consequences including worsening of COPD, risk of lung cancer , stroke and heart disease.Jillian [name]

## 2019-12-02 NOTE — Assessment & Plan Note (Signed)
Cards appointment 12/17/2019 for evaluation

## 2019-12-03 NOTE — Progress Notes (Signed)
Please call patient and let her know her BNP was normal. Thanks so much

## 2019-12-03 NOTE — Progress Notes (Signed)
Please call patient and let her know her CXR showed no acute cardiopulmonary disease. Let her know this is good news. Thanks so much

## 2019-12-04 DIAGNOSIS — R918 Other nonspecific abnormal finding of lung field: Secondary | ICD-10-CM

## 2019-12-07 NOTE — Telephone Encounter (Signed)
Pt has been seen once by our office, lots of correspondence after about pt not feeling better. Since pt has completed abx and pred taper with no relief, pt needs to schedule a televisit/mychart visit with any APP/provider to evaluate further. lmtcb X1 for pt to schedule appt. Also sent mychart message relaying this info to pt.

## 2019-12-09 ENCOUNTER — Encounter: Payer: Self-pay | Admitting: Acute Care

## 2019-12-09 ENCOUNTER — Other Ambulatory Visit: Payer: Self-pay

## 2019-12-09 ENCOUNTER — Ambulatory Visit (INDEPENDENT_AMBULATORY_CARE_PROVIDER_SITE_OTHER): Payer: 59 | Admitting: Acute Care

## 2019-12-09 VITALS — BP 124/64 | HR 99 | Temp 97.3°F | Ht 62.0 in | Wt 176.4 lb

## 2019-12-09 DIAGNOSIS — R0600 Dyspnea, unspecified: Secondary | ICD-10-CM | POA: Diagnosis not present

## 2019-12-09 DIAGNOSIS — Z720 Tobacco use: Secondary | ICD-10-CM | POA: Diagnosis not present

## 2019-12-09 DIAGNOSIS — J441 Chronic obstructive pulmonary disease with (acute) exacerbation: Secondary | ICD-10-CM

## 2019-12-09 DIAGNOSIS — R0609 Other forms of dyspnea: Secondary | ICD-10-CM

## 2019-12-09 NOTE — Progress Notes (Signed)
Do you have any concern for PE? She isn't tachycardic, but that could explain resting dyspnea. I agree with your plan.  LPC

## 2019-12-09 NOTE — Progress Notes (Addendum)
History of Present Illness Jillian [name] is a 54 y.o. female current every day smoker ( 1 PPD with a 55 pack year smoking history)  referred to the pulmonary clinic 10/2019 for pulmonary nodules . She was  scheduled to be seen by Dr. Carlis Abbott. She was seen acutely by NP  for shortness of breath with and without exertion prior to her consult.    12/09/2019 Follow up : Pt. Was seen 11/25/2019 for acute worsening of her exertional dyspnea . She was scheduled to be seen by Dr. Carlis Abbott, but had an exacerbation prior to her appointment, and was seen by NP on 11/25/2019 for the acute dyspnea. At the time of the appointment the patient's CXR was negative for acute issues. D dimer was WNL, Troponins were negative, BNP was WNL., CBC was + for Leukocytosis, negative for anemia, and BMET was essentially normal, with good renal function and potassium of 5.0. Instructed no OJ or bananas) The patient was given a prednisone taper at the time of the OV, in addition to a z pack. . She has completed both. She states that this did help her breathing, but as soon as she started weaning off the medication she was aware of her shortness of breath. She does not feel she is any better after treatment. She states she did not feel any better on the prednisone. No improvement in her dyspnea. She does feel the antibiotic has helped. She did note that she has a self diuresis Friday night and Saturday. She states the chest tightness improved after this. She states that she has increased fatigue. But less chest tightness. She has had a hard time working. ( She is a Development worker, community carrier). She did note that she feels better when she does not smoke. She has tried to quit smoking as much. She has purchased patches. She has clear yellow secretions. They are thick. She has a productive cough. She does have some wheezing with exertion. She is compliant with her Spiriva and Advair. She states she gets more shortness of breath  with her Albuterol. We did refer the  patient to Cardiology as she has a significant family history. All labs that were ordered at the last visit were Orders have been written for PFT's, and will be scheduled at the first opening. ( She is scheduled for April 2021) Additionally we will order  an echo to evaluate for pulmonary HTN/ Heart failure.She just had CT Chest 09/2019, but we can consider HRCT if no explanation after cards work up. Pt states she has a history of Alpha 1 deficiency in aunt ( who also had a smoking history).  Test Results:  12/09/2019 12 Lead EKG ? RSR in V2>> Mitral valve vs Pulmonary HTN Appears to be NSR otherwise  CXR 11/25/2019 Lungs are hyperexpanded with mild prominence of the bronchovascular markings, unchanged from the prior study. No evidence of pneumonia or pulmonary edema. No pleural effusion or pneumothorax.  Skeletal structures are unremarkable.  Labs 11/25/2019 D-dimer>> 0.35 Troponin>> 3 BNP>> 49  11/19/2019  SARS-CoV-2, NAA Not Detected Not Detected    09/29/2020 CT Chest w/o Contrast Multiple bilateral pulmonary nodules are unchanged from 05/13/2019. Follow-up non-contrast CT in 12-18 months is recommended for high-risk patients.     CBC Latest Ref Rng & Units 11/25/2019 07/10/2019 08/22/2017  WBC 4.0 - 10.5 K/uL 13.0(H) 9.7 11.3(H)  Hemoglobin 12.0 - 15.0 g/dL 15.5(H) 15.4(H) 14.9  Hematocrit 36.0 - 46.0 % 47.4(H) 46.7(H) 44.5  Platelets 150.0 - 400.0 K/uL  313.0 266 -    BMP Latest Ref Rng & Units 11/25/2019 07/10/2019 08/22/2017  Glucose 70 - 99 mg/dL 98 106(H) 95  BUN 6 - 23 mg/dL 15 16 16   Creatinine 0.40 - 1.20 mg/dL 0.86 0.71 0.74  BUN/Creat Ratio 9 - 23 - - 22  Sodium 135 - 145 mEq/L 136 138 141  Potassium 3.5 - 5.1 mEq/L 5.0 4.1 4.2  Chloride 96 - 112 mEq/L 98 100 101  CO2 19 - 32 mEq/L 31 29 25   Calcium 8.4 - 10.5 mg/dL 9.8 9.3 9.3    BNP No results found for: BNP  ProBNP    Component Value Date/Time   PROBNP 49.0 11/25/2019 1540    PFT No results found  for: FEV1PRE, FEV1POST, FVCPRE, FVCPOST, TLC, DLCOUNC, PREFEV1FVCRT, PSTFEV1FVCRT  DG Chest 2 View  Result Date: 11/25/2019 CLINICAL DATA:  Productive cough. Dyspnea on exertion for 3 weeks. History of asthma. EXAM: CHEST - 2 VIEW COMPARISON:  07/30/2019. FINDINGS: Cardiac silhouette is normal in size. No mediastinal or hilar masses. No evidence of adenopathy. Lungs are hyperexpanded with mild prominence of the bronchovascular markings, unchanged from the prior study. No evidence of pneumonia or pulmonary edema. No pleural effusion or pneumothorax. Skeletal structures are unremarkable. IMPRESSION: 1. No acute cardiopulmonary disease. 2. COPD.  Stable appearance from the prior study. Electronically Signed   By: Lajean Manes M.D.   On: 11/25/2019 14:55     Past medical hx Past Medical History:  Diagnosis Date  . Anxiety   . Arthritis   . Asthma   . Depression   . High blood pressure   . History of bladder problems   . Migraines   . Sleep apnea      Social History   Tobacco Use  . Smoking status: Current Every Day Smoker    Packs/day: 1.00    Years: 55.00    Pack years: 55.00    Types: Cigarettes  . Smokeless tobacco: Never Used  Substance Use Topics  . Alcohol use: Yes    Comment: hardly ever  . Drug use: No    Jillian [name] reports that she has been smoking cigarettes. She has a 55.00 pack-year smoking history. She has never used smokeless tobacco. She reports current alcohol use. She reports that she does not use drugs.  Tobacco Cessation: Current every day smoker, with a 55 pack year smoking history  Past surgical hx, Family hx, Social hx all reviewed.  Current Outpatient Medications on File Prior to Visit  Medication Sig  . Aspirin-Acetaminophen-Caffeine (GOODY HEADACHE PO) Take by mouth as needed.  . colestipol (COLESTID) 1 g tablet Take 1 tablet by mouth as needed.  . DULoxetine (CYMBALTA) 60 MG capsule Take 60 mg by mouth daily.  . fluticasone-salmeterol (ADVAIR HFA)  115-21 MCG/ACT inhaler Inhale 2 puffs into the lungs 2 (two) times daily.  Marland Kitchen loratadine (CLARITIN) 10 MG tablet Take 10 mg by mouth daily.  Marland Kitchen lubiprostone (AMITIZA) 8 MCG capsule Take 1 capsule (8 mcg total) by mouth 2 (two) times daily with a meal.  . mometasone (NASONEX) 50 MCG/ACT nasal spray Place 1 spray into the nose as needed.  . Omega-3 Fatty Acids ([employer] FISH OIL) 1000 MG CAPS Take 1 capsule by mouth 3 (three) times daily.  . pantoprazole (PROTONIX) 40 MG tablet Take 1 tablet by mouth 2 (two) times daily.  Marland Kitchen tiotropium (SPIRIVA) 18 MCG inhalation capsule Place 18 mcg into inhaler and inhale daily.  . valsartan-hydrochlorothiazide (DIOVAN-HCT) 160-12.5 MG tablet Take  1 tablet by mouth daily.  . vitamin B-12 (CYANOCOBALAMIN) 1000 MCG tablet Take 1,000 mcg by mouth daily.  . Vitamin D, Ergocalciferol, (DRISDOL) 1.25 MG (50000 UT) CAPS capsule Take 1 capsule by mouth once a week.   No current facility-administered medications on file prior to visit.     Allergies  Allergen Reactions  . Banana Hives  . Doxycycline     Review Of Systems:  Constitutional:   No  weight loss, night sweats,  Fevers, chills, fatigue, or  lassitude.  HEENT:   No headaches,  Difficulty swallowing,  Tooth/dental problems, or  Sore throat,                No sneezing, itching, ear ache, nasal congestion, post nasal drip,   CV:  No chest pain,  Orthopnea, PND, swelling in lower extremities, anasarca, dizziness, palpitations, syncope.   GI  No heartburn, indigestion, abdominal pain, nausea, vomiting, diarrhea, change in bowel habits, loss of appetite, bloody stools.   Resp: + shortness of breath with exertion or at rest.  + baseline  excess mucus, + baseline  productive cough,  No non-productive cough,  No coughing up of blood.  No change in color of mucus.  + wheezing with exertion.  No chest wall deformity  Skin: no rash or lesions.  GU: no dysuria, change in color of urine, no urgency or frequency.  No  flank pain, no hematuria   MS:  No joint pain or swelling.  No decreased range of motion.  No back pain.  Psych:  No change in mood or affect. No depression or anxiety.  No memory loss.   Vital Signs BP 124/64 (BP Location: Left Arm, Cuff Size: Normal)   Pulse 99   Temp (!) 97.3 F (36.3 C) (Oral)   Ht 5\' 2"  (1.575 m)   Wt 176 lb 6.4 oz (80 kg)   SpO2 96%   BMI 32.26 kg/m    Physical Exam:  General- No distress,  A&Ox3, pleasant ENT: No sinus tenderness, TM clear, pale nasal mucosa, no oral exudate,no post nasal drip, no LAN Cardiac: S1, S2, regular rate and rhythm, no murmur Chest: + right sided  Wheeze/ No rales/ dullness; no accessory muscle use, no nasal flaring, no sternal retractions Abd.: Soft Non-tender, ND, BS +, Body mass index is 32.26 kg/m. Ext: No clubbing cyanosis, edema Neuro:  normal strength, MAE x 4, A&O x 3 Skin: No rashes, warm and dry, + tattoos Psych: normal mood and behavior   Assessment/Plan  Dyspnea on exertion Not improved after pred taper and z pack CXR Clear Concern for cardiogenic etiology/ mitral valve ( RSR in V1/ V2 per EKG 2/24) ? Pulmonary hypertension Negative d dimer ( 0.35)  Physical deconditioning Plan We will do an EKG today We will order PFT's We will order Echo>>  We will check BMET today to ensure your potasium has improved.  We will check and alpha 1 today We will walk you in the office today.  We will walk the patient in the office today>> dropped from 96% to 92% We will schedule for a formal 6 minute walk.  Follow up with Cardiology as is scheduled 12/16/2019. Please schedule for a 6 minute walk     Suspected COPD Hyper inflation per CXR Needs PFT's Plan We have scheduled PFT's for April Continue your Spiriva and Advair as you have been doing.  Rinse mouth after use Use Albuterol as needed for breakthrough shortness of breath or wheezing  Tobacco Abuse Pulmonary Nodules Still smoking 1PPD + Plan Follow up  CT Chest 09/2020 per recommendations Please work on quitting smoking. This is the single most powerful action you can take to decrease your risk of lung cancer, heart disease, stroke and worsening pulmonary disease.   Magdalen Spatz, NP 12/09/2019  4:31 PM

## 2019-12-09 NOTE — Patient Instructions (Addendum)
It is good to see you today. We will do an EKG today. I will order a 2 D Echo We will repeat BMET today to re-check potassium We will check and alpha 1 today We will call you with the results We will walk you in the office today.  Continue your Spiriva and Advair as you have been doing.  Follow up CT 09/2020 Please continue to work on quitting smoking.  We will see if we can get your COVID test scheduled for 01/26/2020 at [facility] ( This is  for her PFT's) Follow up with Dr. Carlis Abbott 3/10 2021 as is scheduled. Follow up with cardiology 12/17/2019 as is scheduled.  Once we have a diagnosis of COPD, we will refer to pulmonary rehab Please work on quitting smoking. This is the single most powerful action you can take to decrease your risk of lung cancer, heart disease, stroke and worsening pulmonary disease. Please contact office for sooner follow up if symptoms do not improve or worsen or seek emergency care

## 2019-12-10 LAB — BASIC METABOLIC PANEL
BUN: 14 mg/dL (ref 6–23)
CO2: 30 mEq/L (ref 19–32)
Calcium: 9.3 mg/dL (ref 8.4–10.5)
Chloride: 100 mEq/L (ref 96–112)
Creatinine, Ser: 1.43 mg/dL — ABNORMAL HIGH (ref 0.40–1.20)
GFR: 37.97 mL/min — ABNORMAL LOW (ref >60.00)
Glucose, Bld: 92 mg/dL (ref 70–99)
Potassium: 4.8 mEq/L (ref 3.5–5.1)
Sodium: 138 mEq/L (ref 135–145)

## 2019-12-11 ENCOUNTER — Other Ambulatory Visit: Payer: Self-pay | Admitting: Acute Care

## 2019-12-11 DIAGNOSIS — R7989 Other specified abnormal findings of blood chemistry: Secondary | ICD-10-CM

## 2019-12-11 DIAGNOSIS — J441 Chronic obstructive pulmonary disease with (acute) exacerbation: Secondary | ICD-10-CM

## 2019-12-11 NOTE — Progress Notes (Signed)
I have called the patient  x 2 to give her the results of her BMET. There was no answer either time.  I have asked her to return the call to  the office .  I also wanted to ensure she was making urine.  Creatinine has doubled  on labs drawn 2/24 . ( Compared to 2 weeks prior.) BUN has remained stable I would like to re-check CMET, CXR and BNP to ensure labwork is  accurate. These can be drawn at [facility]. The orders have been entered for future collection if patient does call back. She can go anytime.

## 2019-12-14 ENCOUNTER — Other Ambulatory Visit (INDEPENDENT_AMBULATORY_CARE_PROVIDER_SITE_OTHER): Payer: 59

## 2019-12-14 ENCOUNTER — Telehealth: Payer: Self-pay | Admitting: Acute Care

## 2019-12-14 DIAGNOSIS — J441 Chronic obstructive pulmonary disease with (acute) exacerbation: Secondary | ICD-10-CM

## 2019-12-14 LAB — COMPREHENSIVE METABOLIC PANEL
ALT: 10 U/L (ref 0–35)
AST: 9 U/L (ref 0–37)
Albumin: 3.9 g/dL (ref 3.5–5.2)
Alkaline Phosphatase: 93 U/L (ref 39–117)
BUN: 13 mg/dL (ref 6–23)
CO2: 30 mEq/L (ref 19–32)
Calcium: 9.6 mg/dL (ref 8.4–10.5)
Chloride: 99 mEq/L (ref 96–112)
Creatinine, Ser: 0.82 mg/dL (ref 0.40–1.20)
GFR: 72.13 mL/min (ref >60.00)
Glucose, Bld: 116 mg/dL — ABNORMAL HIGH (ref 70–99)
Potassium: 4.2 mEq/L (ref 3.5–5.1)
Sodium: 138 mEq/L (ref 135–145)
Total Bilirubin: 0.3 mg/dL (ref 0.2–1.2)
Total Protein: 7 g/dL (ref 6.0–8.3)

## 2019-12-14 LAB — BRAIN NATRIURETIC PEPTIDE: Pro B Natriuretic peptide (BNP): 25 pg/mL (ref 0.0–100.0)

## 2019-12-14 NOTE — Telephone Encounter (Signed)
Called and spoke with Patient.  Patient stated she was coming today for her labs ordered by Judson Roch, NP. BNP and CMP ordered for [facility].  Patient requested labs be at [facility]. Office, because Patient is bringing  paperwork to office.  BNP and CMP changed to [facility] office.  Patient stated she should be her, after 2pm today. Nothing further at this time.

## 2019-12-15 NOTE — Progress Notes (Signed)
Please call patient and let her know her creatinine has improved. If she needs a video visit before she see's Dr. Carlis Abbott on 3/10 she can be put on my schedule for 12/16/2019 at 1:30 or 2. Thanks ( She sent an e mail yesterday).

## 2019-12-16 ENCOUNTER — Encounter: Payer: Self-pay | Admitting: Gastroenterology

## 2019-12-16 ENCOUNTER — Telehealth: Payer: Self-pay

## 2019-12-16 ENCOUNTER — Ambulatory Visit (INDEPENDENT_AMBULATORY_CARE_PROVIDER_SITE_OTHER): Payer: 59 | Admitting: Gastroenterology

## 2019-12-16 ENCOUNTER — Encounter: Payer: Self-pay | Admitting: *Deleted

## 2019-12-16 ENCOUNTER — Other Ambulatory Visit: Payer: Self-pay

## 2019-12-16 VITALS — BP 112/62 | HR 96 | Temp 97.5°F | Ht 62.0 in | Wt 178.2 lb

## 2019-12-16 DIAGNOSIS — R1010 Upper abdominal pain, unspecified: Secondary | ICD-10-CM

## 2019-12-16 MED ORDER — LUBIPROSTONE 24 MCG PO CAPS: ORAL_CAPSULE | ORAL | 1 refills | Status: DC

## 2019-12-16 NOTE — Patient Instructions (Signed)
1. Increase Amitiza to 69mcg once daily with food for constipation. I have sent in new RX. You can also try taking two of your 58mcg capsules at supper first if you would like but if not effective then pick up new RX for the 41mcg. 2. Call in 2-3 weeks with progress report.

## 2019-12-16 NOTE — Telephone Encounter (Signed)
Received FMLA forms from pt and sent them to [employer] through interoffice mail. Will await to receive forms for signature after [employer] reviews.

## 2019-12-16 NOTE — Progress Notes (Signed)
Primary Care Physician: Neale Burly, MD  Primary Gastroenterologist:  Garfield Cornea, MD   Chief Complaint  Patient presents with  . Abdominal Pain    upper abd when she eats, has to "burp chunks" to get the pain to stop. reports only eating once a day    HPI: Jillian [name] is a 54 y.o. female here for follow-up.  She was seen back in September for abdominal pain and change in bowel habits.  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  She reports there was concern for possibility of Crohn's disease but she was never officially diagnosed.  Chronic GI issues since then.  Complains of postprandial urgency and diarrhea.  In the past failed Bentyl.  She was managed with Colestid in the past.  Currently has on a medication list but really does not take it because her postprandial diarrhea seem to have resolved.  Last colonoscopy was in 2019.  She had a normal terminal ileum, ileal biopsies unremarkable.  Random colon biopsies were negative.  Evidence of prior end to side ileocolonic anastomosis in the cecum.  Diverticulosis noted in the sigmoid and descending colon.  Advised to have a 10-year follow-up colonoscopy.  As part of her work-up in September she had a CT abdomen pelvis with contrast.  No acute findings.  She did have a 10 mm lung nodule right middle lobe, new from 2015.  Radiology recommended 73-month follow-up CT chest, PET CT for tissue sampling.  Patient wanted to pursue CT chest in 3 months.  Chest CT done in December 2020 showing multiple bilateral pulmonary nodules unchanged from July 2020.  Recommended noncontrast CT in 12 to 18 months.  Also noted to have emphysema.  Noted to have adherent debris in the trachea?  Mucus.  At that point we recommended she follow-up with pulmonology, currently being managed by the [facility].  Patient states she started having new shortness of breath, chest tightness back in February.  She had a Covid test which was negative.   Actively being managed by pulmonology.  They have concerned that she may have a cardiogenic etiology for her dyspnea never advised her to see cardiology.  She had labs 3 days ago indicating normal LFTs, creatinine had returned to normal 1 week ago.  CBC 3 weeks ago showed white blood cell count of 13,000, hemoglobin 15.4, platelet 313,000.  From a GI standpoint she continues to feel bloated.  She feels like this is worsening her shortness of breath.  Feels like her upper abdomen remains distended.  Her diarrhea is much better.  Rarely occurs.  She is on pantoprazole before breakfast.  Takes Amitiza generally once daily in the evening.  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  After taking it she feels like she develops upper abdominal discomfort.  She has tried Alka-Seltzer for this.  Feels like she needs to get a good burp up to get relief.  Sometimes drinks Dr. Malachi Bonds to accomplish this.  Denies vomiting.  With eating she feels like things are not going to really fast.  Seems like fluid is sloshing around in her stomach.  Associated right upper quadrant discomfort.  Her bowel movements go from mushy to having to strain.  Some days no bowel movements.  No melena or rectal bleeding.  Weight is up 7 pounds in the past month.    Wt Readings from Last 3 Encounters:  12/16/19 178 lb 3.2 oz (80.8 kg)  12/09/19  176 lb 6.4 oz (80 kg)  11/25/19 171 lb 9.6 oz (77.8 kg)     Current Outpatient Medications  Medication Sig Dispense Refill  . Aspirin-Acetaminophen-Caffeine (GOODY HEADACHE PO) Take by mouth as needed.    . DULoxetine (CYMBALTA) 60 MG capsule Take 60 mg by mouth 2 (two) times daily.     . fluticasone-salmeterol (ADVAIR HFA) 115-21 MCG/ACT inhaler Inhale 2 puffs into the lungs 2 (two) times daily.    Marland Kitchen loratadine (CLARITIN) 10 MG tablet Take 10 mg by mouth daily.    Marland Kitchen lubiprostone (AMITIZA) 8 MCG capsule Take 1 capsule (8 mcg total) by mouth 2 (two) times daily with a meal. (Patient taking  differently: Take 8 mcg by mouth daily. ) 180 capsule 3  . mometasone (NASONEX) 50 MCG/ACT nasal spray Place 1 spray into the nose as needed.    . Omega-3 Fatty Acids ([employer] FISH OIL) 1000 MG CAPS Take 1 capsule by mouth 3 (three) times daily.    . pantoprazole (PROTONIX) 40 MG tablet Take 1 tablet by mouth daily.     . Sodium Bicarbonate-Citric Acid (ALKA-SELTZER HEARTBURN PO) Take by mouth daily.    Marland Kitchen tiotropium (SPIRIVA) 18 MCG inhalation capsule Place 18 mcg into inhaler and inhale daily.    . valsartan-hydrochlorothiazide (DIOVAN-HCT) 160-12.5 MG tablet Take 1 tablet by mouth daily.    . vitamin B-12 (CYANOCOBALAMIN) 1000 MCG tablet Take 1,000 mcg by mouth daily.    . Vitamin D, Ergocalciferol, (DRISDOL) 1.25 MG (50000 UT) CAPS capsule Take 1 capsule by mouth once a week.     No current facility-administered medications for this visit.    Allergies as of 12/16/2019 - Review Complete 12/16/2019  Allergen Reaction Noted  . Banana Hives 07/10/2019  . Doxycycline  08/23/2016    ROS:  General: Negative for anorexia, weight loss, fever, chills, fatigue, weakness. ENT: Negative for hoarseness, difficulty swallowing , nasal congestion. CV: Negative for chest pain, angina, palpitations, dyspnea on exertion, peripheral edema.  Respiratory: Negative for dyspnea at rest, dyspnea on exertion, cough, sputum, wheezing.  GI: See history of present illness. GU:  Negative for dysuria, hematuria, urinary incontinence, urinary frequency, nocturnal urination.  Endo: Negative for unusual weight change.    Physical Examination:   BP 112/62   Pulse 96   Temp (!) 97.5 F (36.4 C) (Oral)   Ht 5\' 2"  (1.575 m)   Wt 178 lb 3.2 oz (80.8 kg)   BMI 32.59 kg/m   General: Well-nourished, well-developed in no acute distress.  Eyes: No icterus. Mouth: masked Lungs: Clear to auscultation bilaterally.  Heart: Regular rate and rhythm, no murmurs rubs or gallops.  Abdomen: Bowel sounds are normal, nontender,  nondistended, no hepatosplenomegaly or masses, no abdominal bruits or hernia , no rebound or guarding.   Extremities: No lower extremity edema. No clubbing or deformities. Neuro: Alert and oriented x 4   Skin: Warm and dry, no jaundice.   Psych: Alert and cooperative, normal mood and affect.  Labs:  Lab Results  Component Value Date   CREATININE 0.82 12/14/2019   BUN 13 12/14/2019   NA 138 12/14/2019   K 4.2 12/14/2019   CL 99 12/14/2019   CO2 30 12/14/2019   Lab Results  Component Value Date   ALT 10 12/14/2019   AST 9 12/14/2019   ALKPHOS 93 12/14/2019   BILITOT 0.3 12/14/2019   Lab Results  Component Value Date   WBC 13.0 (H) 11/25/2019   HGB 15.5 (H) 11/25/2019  HCT 47.4 (H) 11/25/2019   MCV 91.8 11/25/2019   PLT 313.0 11/25/2019    Imaging Studies: DG Chest 2 View  Result Date: 11/25/2019 CLINICAL DATA:  Productive cough. Dyspnea on exertion for 3 weeks. History of asthma. EXAM: CHEST - 2 VIEW COMPARISON:  07/30/2019. FINDINGS: Cardiac silhouette is normal in size. No mediastinal or hilar masses. No evidence of adenopathy. Lungs are hyperexpanded with mild prominence of the bronchovascular markings, unchanged from the prior study. No evidence of pneumonia or pulmonary edema. No pleural effusion or pneumothorax. Skeletal structures are unremarkable. IMPRESSION: 1. No acute cardiopulmonary disease. 2. COPD.  Stable appearance from the prior study. Electronically Signed   By: Lajean Manes M.D.   On: 11/25/2019 14:55

## 2019-12-17 ENCOUNTER — Ambulatory Visit (INDEPENDENT_AMBULATORY_CARE_PROVIDER_SITE_OTHER): Payer: 59 | Admitting: Cardiovascular Disease

## 2019-12-17 ENCOUNTER — Encounter: Payer: Self-pay | Admitting: *Deleted

## 2019-12-17 ENCOUNTER — Encounter: Payer: Self-pay | Admitting: Gastroenterology

## 2019-12-17 ENCOUNTER — Encounter: Payer: Self-pay | Admitting: Cardiovascular Disease

## 2019-12-17 VITALS — BP 122/60 | HR 95 | Ht 62.0 in | Wt 176.0 lb

## 2019-12-17 DIAGNOSIS — M79605 Pain in left leg: Secondary | ICD-10-CM

## 2019-12-17 DIAGNOSIS — I208 Other forms of angina pectoris: Secondary | ICD-10-CM | POA: Diagnosis not present

## 2019-12-17 DIAGNOSIS — M79604 Pain in right leg: Secondary | ICD-10-CM

## 2019-12-17 DIAGNOSIS — Z720 Tobacco use: Secondary | ICD-10-CM

## 2019-12-17 DIAGNOSIS — Z01812 Encounter for preprocedural laboratory examination: Secondary | ICD-10-CM | POA: Diagnosis not present

## 2019-12-17 DIAGNOSIS — I2510 Atherosclerotic heart disease of native coronary artery without angina pectoris: Secondary | ICD-10-CM

## 2019-12-17 DIAGNOSIS — I10 Essential (primary) hypertension: Secondary | ICD-10-CM

## 2019-12-17 DIAGNOSIS — J439 Emphysema, unspecified: Secondary | ICD-10-CM

## 2019-12-17 DIAGNOSIS — R079 Chest pain, unspecified: Secondary | ICD-10-CM | POA: Diagnosis not present

## 2019-12-17 DIAGNOSIS — R0600 Dyspnea, unspecified: Secondary | ICD-10-CM

## 2019-12-17 DIAGNOSIS — R0609 Other forms of dyspnea: Secondary | ICD-10-CM

## 2019-12-17 MED ORDER — METOPROLOL TARTRATE 100 MG PO TABS: 100.0000 mg | ORAL_TABLET | Freq: Once | ORAL | 0 refills | Status: DC

## 2019-12-17 NOTE — Addendum Note (Signed)
Addended by: Laurine Blazer on: 12/17/2019 03:33 PM   Modules accepted: Orders

## 2019-12-17 NOTE — Patient Instructions (Addendum)
Medication Instructions:  Continue all current medications.  Labwork:  BMET - order given today.   Please do 2-3 days prior to test.   Testing/Procedures:  Your physician has requested that you have cardiac CT. Cardiac computed tomography (CT) is a painless test that uses an x-ray machine to take clear, detailed pictures of your heart. For further information please visit [facility]. Please follow instruction sheet as given.  Your physician has requested that you have an ankle brachial index (ABI). During this test an ultrasound and blood pressure cuff are used to evaluate the arteries that supply the arms and legs with blood. Allow thirty minutes for this exam. There are no restrictions or special instructions.  Office will contact with results via phone or letter.    Follow-Up: 2 months   Any Other Special Instructions Will Be Listed Below (If Applicable).  If you need a refill on your cardiac medications before your next appointment, please call your pharmacy.

## 2019-12-17 NOTE — Progress Notes (Signed)
CARDIOLOGY CONSULT NOTE  Patient ID: Jillian [name] MRN: [id number] DOB/AGE: 1966/03/15 54 y.o.  Admit date: (Not on file) Primary Physician: Neale Burly, MD  Reason for Consultation: Chest pain and exertional dyspnea  HPI: Jillian [name] is a 54 y.o. female who is being seen today for the evaluation of chest pain and exertional dyspnea at the request of Magdalen Spatz, NP.   Past medical history includes GERD, COPD, tobacco abuse, and hypertension.  I reviewed pulmonary notes dated 12/09/2019.  There is mention of chest tightness and exertional dyspnea.  She was negative for COVID-19 on 11/19/2019.  I reviewed the chest CT from 09/30/2019 which showed atherosclerotic calcification of the aorta, aortic valve and coronary arteries. There were also multiple bilateral pulmonary nodules which are unchanged from July 2020.  Emphysema was seen.  An echocardiogram was ordered and is scheduled for 12/31/2019.  I personally reviewed the ECG performed on 12/08/2018 which demonstrated sinus rhythm with no ischemic ST segment or T wave abnormalities, nor any arrhythmias.  She has been feeling fatigued and weak with a generalized lack of energy.  She has been having increasing shortness of breath.  There were time she felt like "an elephant sitting on my chest ".  She has had left inframammary sharp chest pains.  She has felt a bandlike tightness across the top of her chest as well.  Both of these can occur both at rest and with exertion.  She has had bilateral feet cramps and leg cramps which have awoken her at night.  She smokes a pack and a half of cigarettes daily.  She also complains of arthritic pain in her right shoulder.  She also has some uncomfortable neck pain in certain positions when lying down.  ECG performed in the office today which I ordered and personally interpreted demonstrates normal sinus rhythm with no ischemic ST segment or T-wave abnormalities, nor any  arrhythmias.  Family history: Father had an MI at 59 and died at age 32 of pancreatic problems.  Paternal grandfather had an MI at 40 as well.  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  She smokes a pack and a half of cigarettes daily.    Allergies  Allergen Reactions  . Banana Hives  . Doxycycline     Current Outpatient Medications  Medication Sig Dispense Refill  . Aspirin-Acetaminophen-Caffeine (GOODY HEADACHE PO) Take by mouth as needed.    . DULoxetine (CYMBALTA) 60 MG capsule Take 60 mg by mouth 2 (two) times daily.     . fluticasone-salmeterol (ADVAIR HFA) 115-21 MCG/ACT inhaler Inhale 2 puffs into the lungs 2 (two) times daily.    Marland Kitchen loratadine (CLARITIN) 10 MG tablet Take 10 mg by mouth daily.    Marland Kitchen lubiprostone (AMITIZA) 24 MCG capsule Take one capsule once to twice daily with food for constipation 180 capsule 1  . mometasone (NASONEX) 50 MCG/ACT nasal spray Place 1 spray into the nose as needed.    . Omega-3 Fatty Acids ([employer] FISH OIL) 1000 MG CAPS Take 1 capsule by mouth 3 (three) times daily.    . pantoprazole (PROTONIX) 40 MG tablet Take 1 tablet by mouth daily.     . Sodium Bicarbonate-Citric Acid (ALKA-SELTZER HEARTBURN PO) Take by mouth daily.    Marland Kitchen tiotropium (SPIRIVA) 18 MCG inhalation capsule Place 18 mcg into inhaler and inhale daily.    . valsartan-hydrochlorothiazide (DIOVAN-HCT) 160-12.5 MG tablet Take 1 tablet  by mouth daily.    . vitamin B-12 (CYANOCOBALAMIN) 1000 MCG tablet Take 1,000 mcg by mouth daily.    . Vitamin D, Ergocalciferol, (DRISDOL) 1.25 MG (50000 UT) CAPS capsule Take 1 capsule by mouth once a week.     No current facility-administered medications for this visit.    Past Medical History:  Diagnosis Date  . Anxiety   . Arthritis   . Asthma   . Depression   . High blood pressure   . History of bladder problems   . Migraines   . Sleep apnea     Past Surgical History:  Procedure Laterality Date  .  APPENDECTOMY    . BLADDER SURGERY     x3  . COLONOSCOPY  06/2018   Dr. Therisa Doyne: Normal terminal ileum with normal biopsies.  Evidence of prior end to side ileocolonic anastomosis in the cecum.  This was patent and was characterized by healthy-appearing mucosa.  Multiple small and large mouth diverticula noted in the sigmoid colon and descending colon.  Random colon biopsies were benign.  Recommended colonoscopy in 10 years.  . ileum resection  1982  . LAPAROSCOPIC CHOLECYSTECTOMY    . PARTIAL HYSTERECTOMY      Social History   Socioeconomic History  . Marital status: Divorced    Spouse name: Not on file  . Number of children: Not on file  . Years of education: Not on file  . Highest education level: Not on file  Occupational History  . Not on file  Tobacco Use  . Smoking status: Current Every Day Smoker    Packs/day: 1.00    Years: 55.00    Pack years: 55.00    Types: Cigarettes  . Smokeless tobacco: Never Used  Substance and Sexual Activity  . Alcohol use: Yes    Comment: hardly ever  . Drug use: No  . Sexual activity: Not on file  Other Topics Concern  . Not on file  Social History Narrative  . Not on file   Social Determinants of Health   Financial Resource Strain:   . Difficulty of Paying Living Expenses: Not on file  Food Insecurity:   . Worried About Charity fundraiser in the Last Year: Not on file  . Ran Out of Food in the Last Year: Not on file  Transportation Needs:   . Lack of Transportation (Medical): Not on file  . Lack of Transportation (Non-Medical): Not on file  Physical Activity:   . Days of Exercise per Week: Not on file  . Minutes of Exercise per Session: Not on file  Stress:   . Feeling of Stress : Not on file  Social Connections:   . Frequency of Communication with Friends and Family: Not on file  . Frequency of Social Gatherings with Friends and Family: Not on file  . Attends Religious Services: Not on file  . Active Member of Clubs or  Organizations: Not on file  . Attends Archivist Meetings: Not on file  . Marital Status: Not on file  Intimate Partner Violence:   . Fear of Current or Ex-Partner: Not on file  . Emotionally Abused: Not on file  . Physically Abused: Not on file  . Sexually Abused: Not on file      Current Meds  Medication Sig  . Aspirin-Acetaminophen-Caffeine (GOODY HEADACHE PO) Take by mouth as needed.  . DULoxetine (CYMBALTA) 60 MG capsule Take 60 mg by mouth 2 (two) times daily.   . fluticasone-salmeterol (ADVAIR  HFA) 115-21 MCG/ACT inhaler Inhale 2 puffs into the lungs 2 (two) times daily.  Marland Kitchen loratadine (CLARITIN) 10 MG tablet Take 10 mg by mouth daily.  Marland Kitchen lubiprostone (AMITIZA) 24 MCG capsule Take one capsule once to twice daily with food for constipation  . mometasone (NASONEX) 50 MCG/ACT nasal spray Place 1 spray into the nose as needed.  . Omega-3 Fatty Acids ([employer] FISH OIL) 1000 MG CAPS Take 1 capsule by mouth 3 (three) times daily.  . pantoprazole (PROTONIX) 40 MG tablet Take 1 tablet by mouth daily.   . Sodium Bicarbonate-Citric Acid (ALKA-SELTZER HEARTBURN PO) Take by mouth daily.  Marland Kitchen tiotropium (SPIRIVA) 18 MCG inhalation capsule Place 18 mcg into inhaler and inhale daily.  . valsartan-hydrochlorothiazide (DIOVAN-HCT) 160-12.5 MG tablet Take 1 tablet by mouth daily.  . vitamin B-12 (CYANOCOBALAMIN) 1000 MCG tablet Take 1,000 mcg by mouth daily.  . Vitamin D, Ergocalciferol, (DRISDOL) 1.25 MG (50000 UT) CAPS capsule Take 1 capsule by mouth once a week.      Review of systems complete and found to be negative unless listed above in HPI   [facility], LPN was present throughout the entirety of the encounter.  Physical exam Blood pressure 122/60, pulse 95, height 5\' 2"  (1.575 m), weight 176 lb (79.8 kg), SpO2 98 %. General: NAD Neck: No JVD, no thyromegaly or thyroid nodule.  Lungs: Somewhat diminished breath sounds bilaterally without crackles or wheezes. CV: Nondisplaced  PMI. Regular rate and rhythm, normal S1/S2, no S3/S4, no murmur.  No peripheral edema.  No carotid bruit.  2+ dorsalis pedis and posterior tibial pulses bilaterally. Abdomen: Soft, nontender, no distention.  Skin: Intact without lesions or rashes.  Neurologic: Alert and oriented x 3.  Psych: Normal affect. Extremities: No clubbing or cyanosis.  HEENT: Normal.   ECG: Most recent ECG reviewed.   Labs: Lab Results  Component Value Date/Time   K 4.2 12/14/2019 02:10 PM   BUN 13 12/14/2019 02:10 PM   BUN 16 08/22/2017 08:49 AM   CREATININE 0.82 12/14/2019 02:10 PM   ALT 10 12/14/2019 02:10 PM   TSH 1.025 07/10/2019 11:25 AM   TSH 1.440 08/22/2017 08:49 AM   HGB 15.5 (H) 11/25/2019 03:40 PM   HGB 14.9 08/22/2017 08:49 AM     Lipids: No results found for: LDLCALC, LDLDIRECT, CHOL, TRIG, HDL      ASSESSMENT AND PLAN:   1.  Chest pain and exertional dyspnea: While she has COPD, she has multiple cardiovascular risk factors including hypertension and tobacco abuse along with a family history of premature coronary artery disease.  Chest CT also showed coronary artery calcifications.  I will proceed with coronary CT angiography to evaluate for obstructive coronary artery disease.  2.  Hypertension: BP is normal.  No changes.  3.  COPD: Managed by pulmonary.  4.  Bilateral leg pain: Given her long history of tobacco abuse, she is at high risk for peripheral arterial disease.  I will obtain ABIs.  5.  Tobacco abuse: She currently smokes 1.5 packs of cigarettes daily.  She wants to quit and is using nicotine patches.    Disposition: Follow up in 3 months virtually  Signed: Kate Sable, M.D., F.A.C.C.  12/17/2019, 3:03 PM

## 2019-12-17 NOTE — Assessment & Plan Note (Signed)
Complains of upper abdominal discomfort, feels bloated and distended.  If she is able to belch or have a good bowel movement she feels relief.  No longer having postprandial diarrhea.  She stopped her Colestid.  She has been on Amitiza taking micrograms just in the evenings.  Finds that she goes sometimes a couple of days without a bowel movement, incomplete stools.  As she does get relief with a good bowel movement, and her stools are less regular, initially we will increase her Amitiza to 24 mcg in the evening.  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  She will call in a couple weeks and let me know how she is doing.  If persistent abdominal bloating/upper abdominal pain, would consider further work-up.

## 2019-12-18 ENCOUNTER — Telehealth: Payer: Self-pay | Admitting: Internal Medicine

## 2019-12-18 LAB — ALPHA-1 ANTITRYPSIN PHENOTYPE: A-1 Antitrypsin, Ser: 184 mg/dL (ref 83–199)

## 2019-12-18 NOTE — Telephone Encounter (Signed)
Letter is ready for pick-up.

## 2019-12-18 NOTE — Telephone Encounter (Signed)
Samples of Linzess 72 mcg are ready for pickup.  

## 2019-12-18 NOTE — Telephone Encounter (Signed)
PATIENT CALLED AGAIN AND WOULD LIKE A WORK NOTE EXCUSING HER FOR TODAY AND YESTERDAY AND TO RETURN Monday

## 2019-12-18 NOTE — Telephone Encounter (Signed)
Pt is still having problems with her bowels and passing blood. Also has questions about her medication. She saw [facility]SL on 12/16/2019. Please advise. [phone]

## 2019-12-18 NOTE — Telephone Encounter (Signed)
Let's stop Amitiza, appears patient not tolerating. Bleeding likely benign given up to date colonoscopy.  If she is willing, let's try low dose Linzess 31mcg daily on empty stomach (takes place of Amitiza). We can offer samples.   If bleeding persists, is heavy, pain does not settle down she should go to ER.

## 2019-12-18 NOTE — Telephone Encounter (Signed)
Spoke with pt. Pt notified of LSL, recommendations. Pt is aware that if she has severe pain, heavy bleeding, she needs to go to the ED.

## 2019-12-18 NOTE — Telephone Encounter (Signed)
Pt was seen on 12/16/2019 was asked to increase Amitiza to 25mcg once daily with food for constipation, pt could also try taking two of your 85mcg capsules at supper first if she would like but if not effective then pick up new RX for the 98mcg. Pt took 2 Amitiza 8 pills the night of her apt 12/16/2019. Pt woke up on 3/4 with diarrhea. Pts stool wasn't bloody when she was having diarrhea. On the evening on 12/17/19, pt woke up in the middle of the night with a stomach ache. Pt went to se the bathroom and she thought she was going to continue to have diarrhea but her stool didn't come out at first. Pt pushed hard to get stool out and when she wiped, she saw bright red blood was on the tissue. No blood was seen in her stool. Pt is only seeing blood on the tissue after wiping. Pt is still having some lower abdomen pain this morning and is at a level 6 with 10 being the highest. Pt feels the pain is a dull ache and it's constant. Pt reports that she didn't take any meds to stop the diarrhea she was having.

## 2019-12-23 ENCOUNTER — Encounter: Payer: Self-pay | Admitting: Critical Care Medicine

## 2019-12-23 ENCOUNTER — Other Ambulatory Visit: Payer: Self-pay

## 2019-12-23 ENCOUNTER — Ambulatory Visit (INDEPENDENT_AMBULATORY_CARE_PROVIDER_SITE_OTHER): Payer: 59 | Admitting: Critical Care Medicine

## 2019-12-23 VITALS — BP 100/54 | HR 92 | Temp 97.2°F | Ht 62.0 in | Wt 175.8 lb

## 2019-12-23 DIAGNOSIS — J309 Allergic rhinitis, unspecified: Secondary | ICD-10-CM | POA: Diagnosis not present

## 2019-12-23 DIAGNOSIS — J439 Emphysema, unspecified: Secondary | ICD-10-CM | POA: Diagnosis not present

## 2019-12-23 DIAGNOSIS — Z23 Encounter for immunization: Secondary | ICD-10-CM

## 2019-12-23 DIAGNOSIS — Z720 Tobacco use: Secondary | ICD-10-CM

## 2019-12-23 DIAGNOSIS — R918 Other nonspecific abnormal finding of lung field: Secondary | ICD-10-CM | POA: Diagnosis not present

## 2019-12-23 NOTE — Progress Notes (Deleted)
  Feeling much better, doesn't feel like chest pressure anymore Had felt tight Zpack, some better Not all the way Prednisone helped  [place] and spiriva long term  Hasn't quit smoking Cough- still some, minimal in throat Sputum- some  Wheeze- mostly at night;  SOB-  Can walk how far- doesn't have to stop; can walk to mailbox and back,then has to stop Walks slower than other people  Doesn't use albuterol? Gets heavy feeling  Smoking >1+ppd Tried patches- smoked with those Tried nocirette chantix- tried, elephant sitting on chest sensation Nicotrol inhaler-didn't help  Has patches again Nerves contributing welbutrin tried- didn't work  Smoked 40+ years x 1 +ppd

## 2019-12-23 NOTE — Patient Instructions (Addendum)
Thank you for visiting Dr. Carlis Abbott at [facility]. We recommend the following: Orders Placed This Encounter  Procedures  . Pulmonary function test   Orders Placed This Encounter  Procedures  . Pulmonary function test    Standing Status:   Future    Standing Expiration Date:   12/22/2020    Order Specific Question:   Where should this test be performed?    Answer:   [facility]    Order Specific Question:   Full PFT: includes the following: basic spirometry, spirometry pre & post bronchodilator, diffusion capacity (DLCO), lung volumes    Answer:   Full PFT   Keep taking Spiriva and Advair as prescribed. Keep working on quitting smoking!     Return in about 3 months (around 03/24/2020).    Please do your part to reduce the spread of COVID-19.  It is very important that you stop smoking or vaping. This is the single most important thing that you can do to improve your lung health.   S = Set a quit date. T = Tell family, friends, and the people around you that you plan to quit. A = Anticipate or plan ahead for the tough times you'll face while quitting. R = Remove cigarettes and other tobacco products from your home, car, and work. T = Talk to [name] about getting help to quit.  If you need help, please reach out to our office or the smoking cessation resources available: [place] Smoking Cessation Class: [id number]:[id number] 1-800-QUIT-NOW www.BeTobaccoFree.gov

## 2019-12-23 NOTE — Progress Notes (Signed)
Synopsis: Referred in February 2021 for COPD, lung cancer screening by Neale Burly, MD.  Subjective:   PATIENT ID: Jillian [name] GENDER: female DOB: 1966/05/04, MRN: [id number]  Chief Complaint  Patient presents with  . Follow-up    Patient feels like her shortness of breath has got better since last visit. Patient still gets winded with exertion but feels like it has got better. Patient has a cough that is sometimes productive and feels like she has to clear her throat.     Jillian [name] is a 54 y/o woman being seen in follow up for COPD and tobacco abuse. She was seen in February for an acute exacerbation treated with prednisone and antibiotics. She feels much better and no longer has chest pressure; previously she had chest tightness going around her chest. She continues on Spiriva daily and Advair BID. She does not use albuterol because it does not helps her symptoms and gives her a chest heaviness. She has an ongoing cough but mild sputum production. She has wheezing at night mostly. She denies DOE but can only walk to her mailbox and back before stopping due to dyspnea and she walks slower than her peers.  She is a current every day smoker with a 58-pack-year history. She smokes 1+ppd currently, which is her heaviest. She has tried chantix in the past but stopped due to chest pressure like an "elephant sitting on my chest". Nicotine inhaler and gum didn't help, and she continued to smoke on her patches. She did not tolerate Wellbutrin well. She has patches and is interested in trying them again. Anxiety is contributing to her difficulty quitting.      Past Medical History:  Diagnosis Date  . Anxiety   . Arthritis   . Asthma   . Depression   . High blood pressure   . History of bladder problems   . Migraines   . Sleep apnea      Family History  Problem Relation Age of Onset  . Diabetes Mother   . COPD Mother   . Heart attack Father   . Diabetes Sister   . Colon cancer Neg  Hx   . Celiac disease Neg Hx   . Inflammatory bowel disease Neg Hx      Past Surgical History:  Procedure Laterality Date  . APPENDECTOMY    . BLADDER SURGERY     x3  . COLONOSCOPY  06/2018   Dr. Therisa Doyne: Normal terminal ileum with normal biopsies.  Evidence of prior end to side ileocolonic anastomosis in the cecum.  This was patent and was characterized by healthy-appearing mucosa.  Multiple small and large mouth diverticula noted in the sigmoid colon and descending colon.  Random colon biopsies were benign.  Recommended colonoscopy in 10 years.  . ileum resection  1982  . LAPAROSCOPIC CHOLECYSTECTOMY    . PARTIAL HYSTERECTOMY      Social History   Socioeconomic History  . Marital status: Divorced    Spouse name: Not on file  . Number of children: Not on file  . Years of education: Not on file  . Highest education level: Not on file  Occupational History  . Not on file  Tobacco Use  . Smoking status: Current Every Day Smoker    Packs/day: 1.00    Years: 55.00    Pack years: 55.00    Types: Cigarettes  . Smokeless tobacco: Never Used  Substance and Sexual Activity  . Alcohol use: Yes  Comment: hardly ever  . Drug use: No  . Sexual activity: Not on file  Other Topics Concern  . Not on file  Social History Narrative  . Not on file   Social Determinants of Health   Financial Resource Strain:   . Difficulty of Paying Living Expenses: Not on file  Food Insecurity:   . Worried About Charity fundraiser in the Last Year: Not on file  . Ran Out of Food in the Last Year: Not on file  Transportation Needs:   . Lack of Transportation (Medical): Not on file  . Lack of Transportation (Non-Medical): Not on file  Physical Activity:   . Days of Exercise per Week: Not on file  . Minutes of Exercise per Session: Not on file  Stress:   . Feeling of Stress : Not on file  Social Connections:   . Frequency of Communication with Friends and Family: Not on file  . Frequency of  Social Gatherings with Friends and Family: Not on file  . Attends Religious Services: Not on file  . Active Member of Clubs or Organizations: Not on file  . Attends Archivist Meetings: Not on file  . Marital Status: Not on file  Intimate Partner Violence:   . Fear of Current or Ex-Partner: Not on file  . Emotionally Abused: Not on file  . Physically Abused: Not on file  . Sexually Abused: Not on file     Allergies  Allergen Reactions  . Banana Hives  . Doxycycline      Immunization History  Administered Date(s) Administered  . Influenza Inj Mdck Quad Pf 07/10/2019  . Influenza Split 08/22/2018  . Influenza,inj,Quad PF,6+ Mos 07/17/2017  . Pneumococcal Polysaccharide-23 10/30/2017    Outpatient Medications Prior to Visit  Medication Sig Dispense Refill  . Aspirin-Acetaminophen-Caffeine (GOODY HEADACHE PO) Take by mouth as needed.    . DULoxetine (CYMBALTA) 60 MG capsule Take 60 mg by mouth 2 (two) times daily.     . fluticasone-salmeterol (ADVAIR HFA) 115-21 MCG/ACT inhaler Inhale 2 puffs into the lungs 2 (two) times daily.    Marland Kitchen loratadine (CLARITIN) 10 MG tablet Take 10 mg by mouth daily.    Marland Kitchen lubiprostone (AMITIZA) 24 MCG capsule Take one capsule once to twice daily with food for constipation 180 capsule 1  . metoprolol tartrate (LOPRESSOR) 100 MG tablet Take 1 tablet (100 mg total) by mouth once for 1 dose. 1 tablet 0  . mometasone (NASONEX) 50 MCG/ACT nasal spray Place 1 spray into the nose as needed.    . Omega-3 Fatty Acids ([employer] FISH OIL) 1000 MG CAPS Take 1 capsule by mouth 3 (three) times daily.    . pantoprazole (PROTONIX) 40 MG tablet Take 1 tablet by mouth daily.     . Sodium Bicarbonate-Citric Acid (ALKA-SELTZER HEARTBURN PO) Take by mouth daily.    Marland Kitchen tiotropium (SPIRIVA) 18 MCG inhalation capsule Place 18 mcg into inhaler and inhale daily.    . valsartan-hydrochlorothiazide (DIOVAN-HCT) 160-12.5 MG tablet Take 1 tablet by mouth daily.    . vitamin B-12  (CYANOCOBALAMIN) 1000 MCG tablet Take 1,000 mcg by mouth daily.    . Vitamin D, Ergocalciferol, (DRISDOL) 1.25 MG (50000 UT) CAPS capsule Take 1 capsule by mouth once a week.     No facility-administered medications prior to visit.    Review of Systems  Respiratory: Positive for cough, shortness of breath and wheezing.   Cardiovascular: Negative for chest pain.     Objective:  Vitals:   12/23/19 1616  BP: (!) 100/54  Pulse: 92  Temp: (!) 97.2 F (36.2 C)  TempSrc: Temporal  SpO2: 94%  Weight: 175 lb 12.8 oz (79.7 kg)  Height: 5\' 2"  (1.575 m)   94% on   RA BMI Readings from Last 3 Encounters:  12/23/19 32.15 kg/m  12/17/19 32.19 kg/m  12/16/19 32.59 kg/m   Wt Readings from Last 3 Encounters:  12/23/19 175 lb 12.8 oz (79.7 kg)  12/17/19 176 lb (79.8 kg)  12/16/19 178 lb 3.2 oz (80.8 kg)    Physical Exam Vitals reviewed.  Constitutional:      Appearance: She is not ill-appearing.  HENT:     Head: Normocephalic and atraumatic.  Eyes:     General: No scleral icterus. Cardiovascular:     Rate and Rhythm: Normal rate and regular rhythm.     Heart sounds: No murmur.  Pulmonary:     Comments: Breathing comfortably on RA, no conversational dyspnea. Prolonged exhalation, no wheezing. Abdominal:     General: There is no distension.     Tenderness: There is no abdominal tenderness.  Musculoskeletal:        General: No swelling or deformity.     Cervical back: Neck supple.  Lymphadenopathy:     Cervical: No cervical adenopathy.  Skin:    General: Skin is warm.     Findings: No rash.  Neurological:     General: No focal deficit present.     Mental Status: She is alert.     Coordination: Coordination normal.  Psychiatric:        Mood and Affect: Mood normal.        Behavior: Behavior normal.      CBC    Component Value Date/Time   WBC 13.0 (H) 11/25/2019 1540   RBC 5.17 (H) 11/25/2019 1540   HGB 15.5 (H) 11/25/2019 1540   HGB 14.9 08/22/2017 0849    HCT 47.4 (H) 11/25/2019 1540   HCT 44.5 08/22/2017 0849   PLT 313.0 11/25/2019 1540   MCV 91.8 11/25/2019 1540   MCV 96 08/22/2017 0849   MCH 31.7 07/10/2019 1125   MCHC 32.6 11/25/2019 1540   RDW 15.5 11/25/2019 1540   RDW 14.7 08/22/2017 0849   LYMPHSABS 2.7 11/25/2019 1540   LYMPHSABS 2.3 08/22/2017 0849   MONOABS 1.1 (H) 11/25/2019 1540   EOSABS 1.2 (H) 11/25/2019 1540   EOSABS 1.0 (H) 08/22/2017 0849   BASOSABS 0.2 (H) 11/25/2019 1540   BASOSABS 0.1 08/22/2017 0849    CHEMISTRY No results for input(s): NA, K, CL, CO2, GLUCOSE, BUN, CREATININE, CALCIUM, MG, PHOS in the last 168 hours. Estimated Creatinine Clearance: 75.7 mL/min (by C-G formula based on SCr of 0.82 mg/dL).   Chest Imaging- films reviewed: CT chest 09/30/2019-emphysema, upper lobe predominant.  Subpleural RML nodule with linear scar new nodules compared to July 2020.  Pulmonary Functions Testing Results: No flowsheet data found.    Echocardiogram: pending      Assessment & Plan:     ICD-10-CM   1. Tobacco abuse  Z72.0   2. Pulmonary nodules  R91.8 Pulmonary function test  3. Pulmonary emphysema, unspecified emphysema type ([place])  J43.9   4. Allergic rhinitis, unspecified seasonality, unspecified trigger  J30.9     Emphysema, history of asthma, likely has COPD. 1 recent exacerbation. -PFTs -Continue Advair twice daily and Spiriva once daily -Continue albuterol as needed -prevnar 13 today; UTD flu and pneumococcal 23 vaccine -recommend covid vaccine when it  is available to her.  Allergic rhinitis -Continue Nasonex and Claritin once daily  Tobacco abuse -Counseled on the importance of quitting risk of ongoing tobacco abuse. Agree with her plan for retrying nicotine replacement therapy. Suggested behavioral modifications to help break the habit. With her previous history of chest pain on chantix and high risk for CAD, will avoid this medication.  Pulmonary nodule, high risk tobacco  history -Follow-up chest CT in 12 to 18 months (December 2021 to June 2022)  GERD -Continue Protonix  High risk for CAD -agree with cardiology evaluation and echocardiogram based on symptoms during recent exacerbation -recommend smoking cessation    Current Outpatient Medications:  .  Aspirin-Acetaminophen-Caffeine (GOODY HEADACHE PO), Take by mouth as needed., Disp: , Rfl:  .  DULoxetine (CYMBALTA) 60 MG capsule, Take 60 mg by mouth 2 (two) times daily. , Disp: , Rfl:  .  fluticasone-salmeterol (ADVAIR HFA) 115-21 MCG/ACT inhaler, Inhale 2 puffs into the lungs 2 (two) times daily., Disp: , Rfl:  .  loratadine (CLARITIN) 10 MG tablet, Take 10 mg by mouth daily., Disp: , Rfl:  .  lubiprostone (AMITIZA) 24 MCG capsule, Take one capsule once to twice daily with food for constipation, Disp: 180 capsule, Rfl: 1 .  metoprolol tartrate (LOPRESSOR) 100 MG tablet, Take 1 tablet (100 mg total) by mouth once for 1 dose., Disp: 1 tablet, Rfl: 0 .  mometasone (NASONEX) 50 MCG/ACT nasal spray, Place 1 spray into the nose as needed., Disp: , Rfl:  .  Omega-3 Fatty Acids ([employer] FISH OIL) 1000 MG CAPS, Take 1 capsule by mouth 3 (three) times daily., Disp: , Rfl:  .  pantoprazole (PROTONIX) 40 MG tablet, Take 1 tablet by mouth daily. , Disp: , Rfl:  .  Sodium Bicarbonate-Citric Acid (ALKA-SELTZER HEARTBURN PO), Take by mouth daily., Disp: , Rfl:  .  tiotropium (SPIRIVA) 18 MCG inhalation capsule, Place 18 mcg into inhaler and inhale daily., Disp: , Rfl:  .  valsartan-hydrochlorothiazide (DIOVAN-HCT) 160-12.5 MG tablet, Take 1 tablet by mouth daily., Disp: , Rfl:  .  vitamin B-12 (CYANOCOBALAMIN) 1000 MCG tablet, Take 1,000 mcg by mouth daily., Disp: , Rfl:  .  Vitamin D, Ergocalciferol, (DRISDOL) 1.25 MG (50000 UT) CAPS capsule, Take 1 capsule by mouth once a week., Disp: , Rfl:     Julian Hy, DO [facility] 12/23/2019 4:35 PM

## 2019-12-25 ENCOUNTER — Encounter: Payer: Self-pay | Admitting: Critical Care Medicine

## 2019-12-25 ENCOUNTER — Ambulatory Visit (INDEPENDENT_AMBULATORY_CARE_PROVIDER_SITE_OTHER): Payer: 59 | Admitting: Critical Care Medicine

## 2019-12-25 DIAGNOSIS — J441 Chronic obstructive pulmonary disease with (acute) exacerbation: Secondary | ICD-10-CM

## 2019-12-25 MED ORDER — PREDNISONE 20 MG PO TABS: 20.0000 mg | ORAL_TABLET | Freq: Every day | ORAL | 0 refills | Status: DC

## 2019-12-25 MED ORDER — AZITHROMYCIN 250 MG PO TABS: 250.0000 mg | ORAL_TABLET | Freq: Every day | ORAL | 0 refills | Status: DC

## 2019-12-25 NOTE — Patient Instructions (Signed)
Thank you for visiting Dr. Carlis Abbott at [facility]. We recommend the following: No orders of the defined types were placed in this encounter.  No orders of the defined types were placed in this encounter.   Meds ordered this encounter  Medications  . azithromycin (ZITHROMAX) 250 MG tablet    Sig: Take 1 tablet (250 mg total) by mouth daily.    Dispense:  6 tablet    Refill:  0  . predniSONE (DELTASONE) 20 MG tablet    Sig: Take 1 tablet (20 mg total) by mouth daily with breakfast.    Dispense:  10 tablet    Refill:  0    Follow up as previously prescribed.   Please do your part to reduce the spread of COVID-19.

## 2019-12-25 NOTE — Telephone Encounter (Signed)
Called and spoke with pt in regards to [employer] received. Dr. Carlis Abbott is in office today and had an opening at 3:45. Stated to pt that we should get her scheduled then for an appt with the weekend coming up and she verbalized understanding.  Pt has been scheduled a televisit at 3:45 today 3/12 with Dr. Carlis Abbott. Nothing further needed.

## 2019-12-25 NOTE — Progress Notes (Signed)
Synopsis: Referred in February 2021 for COPD, lung cancer screening by Neale Burly, MD.  Subjective:   PATIENT ID: Jillian [name] GENDER: female DOB: June 19, 1966, MRN: [id number]  Chief Complaint  Patient presents with  . Follow-up    Patient states that when she got up Thursday morning she was short of breath and dizzy. Patient states she slept all day till about 5:30 woke up with heaviness in her chest and took some mucinex. Cough and wheezing started today with green phelgm.   Virtual Visit via Telephone Note  I connected with TORRIE ULCH on 12/25/19 at  3:45 PM EST by telephone and verified that I am speaking with the correct person using two identifiers.  Location: Patient: home Provider: [facility] office- [place], [place]   I discussed the limitations, risks, security and privacy concerns of performing an evaluation and management service by telephone and the availability of in person appointments. I also discussed with the patient that there may be a patient responsible charge related to this service. The patient expressed understanding and agreed to proceed.   I discussed the assessment and treatment plan with the patient. The patient was provided an opportunity to ask questions and all were answered. The patient agreed with the plan and demonstrated an understanding of the instructions.   The patient was advised to call back or seek an in-person evaluation if the symptoms worsen or if the condition fails to improve as anticipated.  I provided 16 minutes of non-face-to-face time during this encounter.     Jillian [name] is a 54 year old woman with a history of COPD who complains of 2 days of shortness of breath, dizziness, chest heaviness, sinus drainage, cough, wheezing, and discolored sputum.  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  She denies fevers or sick contacts.  She thinks  that she needs antibiotics and prednisone to help before her symptoms get to severe.    OV 12/23/2019: Jillian [name] is a 54 y/o woman being seen in follow up for COPD and tobacco abuse. She was seen in February for an acute exacerbation treated with prednisone and antibiotics. She feels much better and no longer has chest pressure; previously she had chest tightness going around her chest. She continues on Spiriva daily and Advair BID. She does not use albuterol because it does not helps her symptoms and gives her a chest heaviness. She has an ongoing cough but mild sputum production. She has wheezing at night mostly. She denies DOE but can only walk to her mailbox and back before stopping due to dyspnea and she walks slower than her peers.  She is a current every day smoker with a 58-pack-year history. She smokes 1+ppd currently, which is her heaviest. She has tried chantix in the past but stopped due to chest pressure like an "elephant sitting on my chest". Nicotine inhaler and gum didn't help, and she continued to smoke on her patches. She did not tolerate Wellbutrin well. She has patches and is interested in trying them again. Anxiety is contributing to her difficulty quitting.    Past Medical History:  Diagnosis Date  . Anxiety   . Arthritis   . Asthma   . Depression   . High blood pressure   . History of bladder problems   . Migraines   . Sleep apnea      Family History  Problem Relation Age of Onset  . Diabetes Mother   . COPD Mother   . Heart attack Father   . Diabetes Sister   . Colon cancer Neg Hx   . Celiac disease Neg Hx   . Inflammatory bowel disease Neg Hx      Past Surgical History:  Procedure Laterality Date  . APPENDECTOMY    . BLADDER SURGERY     x3  . COLONOSCOPY  06/2018   Dr. Therisa Doyne: Normal terminal ileum with normal biopsies.  Evidence of prior end to side ileocolonic anastomosis in the cecum.  This was patent and was characterized by healthy-appearing mucosa.   Multiple small and large mouth diverticula noted in the sigmoid colon and descending colon.  Random colon biopsies were benign.  Recommended colonoscopy in 10 years.  . ileum resection  1982  . LAPAROSCOPIC CHOLECYSTECTOMY    . PARTIAL HYSTERECTOMY      Social History   Socioeconomic History  . Marital status: Divorced    Spouse name: Not on file  . Number of children: Not on file  . Years of education: Not on file  . Highest education level: Not on file  Occupational History  . Not on file  Tobacco Use  . Smoking status: Current Every Day Smoker    Packs/day: 1.00    Years: 55.00    Pack years: 55.00    Types: Cigarettes  . Smokeless tobacco: Never Used  Substance and Sexual Activity  . Alcohol use: Yes    Comment: hardly ever  . Drug use: No  . Sexual activity: Not on file  Other Topics Concern  . Not on file  Social History Narrative  . Not on file   Social Determinants of Health   Financial Resource Strain:   . Difficulty of Paying Living Expenses:   Food Insecurity:   . Worried About Charity fundraiser in the Last Year:   . Arboriculturist in the Last Year:   Transportation Needs:   . Film/video editor (Medical):   Marland Kitchen Lack of Transportation (Non-Medical):   Physical Activity:   . Days of Exercise per Week:   . Minutes of Exercise per Session:   Stress:   . Feeling of Stress :   Social Connections:   . Frequency of Communication with Friends and Family:   . Frequency of Social Gatherings with Friends and Family:   . Attends Religious Services:   . Active Member of Clubs or Organizations:   . Attends Archivist Meetings:   Marland Kitchen Marital Status:   Intimate Partner Violence:   . Fear of Current or Ex-Partner:   . Emotionally Abused:   Marland Kitchen Physically Abused:   . Sexually Abused:      Allergies  Allergen Reactions  . Banana Hives  . Doxycycline      Immunization History  Administered Date(s) Administered  . Influenza Inj Mdck Quad Pf  07/10/2019  . Influenza Split 08/22/2018  . Influenza,inj,Quad PF,6+ Mos 07/17/2017  . Pneumococcal Conjugate-13 12/23/2019  . Pneumococcal Polysaccharide-23 10/30/2017    Outpatient Medications Prior to Visit  Medication Sig Dispense Refill  . Aspirin-Acetaminophen-Caffeine (GOODY HEADACHE PO) Take by mouth as needed.    . DULoxetine (CYMBALTA) 60 MG capsule Take 60 mg by mouth 2 (two) times daily.     . fluticasone-salmeterol (ADVAIR HFA) 115-21 MCG/ACT inhaler Inhale 2 puffs into the lungs 2 (two) times daily.    Marland Kitchen loratadine (CLARITIN) 10 MG tablet Take  10 mg by mouth daily.    Marland Kitchen lubiprostone (AMITIZA) 24 MCG capsule Take one capsule once to twice daily with food for constipation 180 capsule 1  . metoprolol tartrate (LOPRESSOR) 100 MG tablet Take 1 tablet (100 mg total) by mouth once for 1 dose. 1 tablet 0  . mometasone (NASONEX) 50 MCG/ACT nasal spray Place 1 spray into the nose as needed.    . Omega-3 Fatty Acids ([employer] FISH OIL) 1000 MG CAPS Take 1 capsule by mouth 3 (three) times daily.    . pantoprazole (PROTONIX) 40 MG tablet Take 1 tablet by mouth daily.     . Sodium Bicarbonate-Citric Acid (ALKA-SELTZER HEARTBURN PO) Take by mouth daily.    Marland Kitchen tiotropium (SPIRIVA) 18 MCG inhalation capsule Place 18 mcg into inhaler and inhale daily.    . valsartan-hydrochlorothiazide (DIOVAN-HCT) 160-12.5 MG tablet Take 1 tablet by mouth daily.    . vitamin B-12 (CYANOCOBALAMIN) 1000 MCG tablet Take 1,000 mcg by mouth daily.    . Vitamin D, Ergocalciferol, (DRISDOL) 1.25 MG (50000 UT) CAPS capsule Take 1 capsule by mouth once a week.     No facility-administered medications prior to visit.    Review of Systems  Respiratory: Positive for cough, shortness of breath and wheezing.   Cardiovascular: Negative for chest pain.     Objective:   There were no vitals filed for this visit.   on   RA BMI Readings from Last 3 Encounters:  12/23/19 32.15 kg/m  12/17/19 32.19 kg/m  12/16/19 32.59  kg/m   Wt Readings from Last 3 Encounters:  12/23/19 175 lb 12.8 oz (79.7 kg)  12/17/19 176 lb (79.8 kg)  12/16/19 178 lb 3.2 oz (80.8 kg)    Physical Exam-limited due to televisit.  No conversational dyspnea or confusion.  Comprehension appears intact.   CBC    Component Value Date/Time   WBC 13.0 (H) 11/25/2019 1540   RBC 5.17 (H) 11/25/2019 1540   HGB 15.5 (H) 11/25/2019 1540   HGB 14.9 08/22/2017 0849   HCT 47.4 (H) 11/25/2019 1540   HCT 44.5 08/22/2017 0849   PLT 313.0 11/25/2019 1540   MCV 91.8 11/25/2019 1540   MCV 96 08/22/2017 0849   MCH 31.7 07/10/2019 1125   MCHC 32.6 11/25/2019 1540   RDW 15.5 11/25/2019 1540   RDW 14.7 08/22/2017 0849   LYMPHSABS 2.7 11/25/2019 1540   LYMPHSABS 2.3 08/22/2017 0849   MONOABS 1.1 (H) 11/25/2019 1540   EOSABS 1.2 (H) 11/25/2019 1540   EOSABS 1.0 (H) 08/22/2017 0849   BASOSABS 0.2 (H) 11/25/2019 1540   BASOSABS 0.1 08/22/2017 0849    CHEMISTRY No results for input(s): NA, K, CL, CO2, GLUCOSE, BUN, CREATININE, CALCIUM, MG, PHOS in the last 168 hours. Estimated Creatinine Clearance: 75.7 mL/min (by C-G formula based on SCr of 0.82 mg/dL).   Chest Imaging- films reviewed: CT chest 09/30/2019-emphysema, upper lobe predominant.  Subpleural RML nodule with linear scar new nodules compared to July 2020.  Pulmonary Functions Testing Results: No flowsheet data found.    Echocardiogram: pending      Assessment & Plan:     ICD-10-CM   1. COPD with acute exacerbation (HCC)  J44.1     Acute COPD exacerbation -Prednisone 40 mg x 5 days -Z-Pak -If her symptoms worsen or fail to improve she should present to the emergency department.  Sputum culture should be obtained if she does not respond to empiric treatment. -Continue all maintenance meds    Previous notes: Emphysema,  history of asthma, likely has COPD. 1 recent exacerbation. -PFTs ordered -Continue Advair twice daily and Spiriva once daily -Continue albuterol as  needed -prevnar 13 today; UTD flu and pneumococcal 23 vaccine -recommend covid vaccine when it is available to her.  Allergic rhinitis -Continue Nasonex and Claritin once daily  Tobacco abuse -Counseled on the importance of quitting risk of ongoing tobacco abuse. Agree with her plan for retrying nicotine replacement therapy. Suggested behavioral modifications to help break the habit. With her previous history of chest pain on chantix and high risk for CAD, will avoid this medication.  Pulmonary nodule, high risk tobacco history -Follow-up chest CT in 12 to 18 months (December 2021 to June 2022)  GERD -Continue Protonix  High risk for CAD -agree with cardiology evaluation and echocardiogram based on symptoms during recent exacerbation -recommend smoking cessation   RTC as previously scheduled.   Current Outpatient Medications:  .  Aspirin-Acetaminophen-Caffeine (GOODY HEADACHE PO), Take by mouth as needed., Disp: , Rfl:  .  DULoxetine (CYMBALTA) 60 MG capsule, Take 60 mg by mouth 2 (two) times daily. , Disp: , Rfl:  .  fluticasone-salmeterol (ADVAIR HFA) 115-21 MCG/ACT inhaler, Inhale 2 puffs into the lungs 2 (two) times daily., Disp: , Rfl:  .  loratadine (CLARITIN) 10 MG tablet, Take 10 mg by mouth daily., Disp: , Rfl:  .  lubiprostone (AMITIZA) 24 MCG capsule, Take one capsule once to twice daily with food for constipation, Disp: 180 capsule, Rfl: 1 .  metoprolol tartrate (LOPRESSOR) 100 MG tablet, Take 1 tablet (100 mg total) by mouth once for 1 dose., Disp: 1 tablet, Rfl: 0 .  mometasone (NASONEX) 50 MCG/ACT nasal spray, Place 1 spray into the nose as needed., Disp: , Rfl:  .  Omega-3 Fatty Acids ([employer]S FISH OIL) 1000 MG CAPS, Take 1 capsule by mouth 3 (three) times daily., Disp: , Rfl:  .  pantoprazole (PROTONIX) 40 MG tablet, Take 1 tablet by mouth daily. , Disp: , Rfl:  .  Sodium Bicarbonate-Citric Acid (ALKA-SELTZER HEARTBURN PO), Take by mouth daily., Disp: , Rfl:  .   tiotropium (SPIRIVA) 18 MCG inhalation capsule, Place 18 mcg into inhaler and inhale daily., Disp: , Rfl:  .  valsartan-hydrochlorothiazide (DIOVAN-HCT) 160-12.5 MG tablet, Take 1 tablet by mouth daily., Disp: , Rfl:  .  vitamin B-12 (CYANOCOBALAMIN) 1000 MCG tablet, Take 1,000 mcg by mouth daily., Disp: , Rfl:  .  Vitamin D, Ergocalciferol, (DRISDOL) 1.25 MG (50000 UT) CAPS capsule, Take 1 capsule by mouth once a week., Disp: , Rfl:  .  azithromycin (ZITHROMAX) 250 MG tablet, Take 1 tablet (250 mg total) by mouth daily., Disp: 6 tablet, Rfl: 0 .  predniSONE (DELTASONE) 20 MG tablet, Take 1 tablet (20 mg total) by mouth daily with breakfast., Disp: 10 tablet, Rfl: 0    Julian Hy, DO [facility] 12/25/2019 10:27 AM

## 2019-12-28 ENCOUNTER — Telehealth: Payer: Self-pay | Admitting: Critical Care Medicine

## 2019-12-28 NOTE — Telephone Encounter (Signed)
Please see my chart message. Ok to write note?

## 2019-12-28 NOTE — Telephone Encounter (Signed)
Pt calling back regarding note for work.  [phone].  Aware that waiting to hear back from Dr. Carlis Abbott.  Please advise

## 2019-12-28 NOTE — Telephone Encounter (Signed)
She is correct, I was waiting to hear back which dates she needed to be off work for her illness. Can we please draft a letter for her employer stating that she was ill and unable to work those dates? I have 2 notes in from visits last week. Thanks!  LPC

## 2019-12-29 ENCOUNTER — Encounter: Payer: Self-pay | Admitting: *Deleted

## 2019-12-29 NOTE — Telephone Encounter (Signed)
Spoke with pt, and advised her that I would fax her note to her employer. Note faxed to number given from pt. Nothing further is needed.

## 2019-12-29 NOTE — Telephone Encounter (Signed)
Letter has been written. LMTCB x1 for pt.

## 2019-12-29 NOTE — Telephone Encounter (Signed)
Patient is returning phone call. Patient phone number is [phone].

## 2019-12-31 ENCOUNTER — Ambulatory Visit (INDEPENDENT_AMBULATORY_CARE_PROVIDER_SITE_OTHER): Payer: 59

## 2019-12-31 ENCOUNTER — Encounter: Payer: Self-pay | Admitting: Cardiovascular Disease

## 2019-12-31 ENCOUNTER — Other Ambulatory Visit: Payer: Self-pay

## 2019-12-31 DIAGNOSIS — R0609 Other forms of dyspnea: Secondary | ICD-10-CM

## 2019-12-31 DIAGNOSIS — R0600 Dyspnea, unspecified: Secondary | ICD-10-CM | POA: Diagnosis not present

## 2020-01-04 ENCOUNTER — Telehealth: Payer: Self-pay | Admitting: Cardiovascular Disease

## 2020-01-04 NOTE — Telephone Encounter (Signed)
Called and spoke to pt. Pt was seen on 3.12.21 by Dr. Carlis Abbott, pt was given zpak and pred. Pt states she has not improved nor worsened. Appt made tomorrow 3.23.21 with Derl Barrow, NP, as a video visit. Advised pt to seek emergency care if s/s worsen over night. Pt verbalized understanding and denied any further questions or concerns at this time.

## 2020-01-04 NOTE — Telephone Encounter (Signed)
See other mychart encounter. Will sign off.

## 2020-01-04 NOTE — Telephone Encounter (Signed)
Patient called in regards to Echo test results.

## 2020-01-05 ENCOUNTER — Telehealth (INDEPENDENT_AMBULATORY_CARE_PROVIDER_SITE_OTHER): Payer: 59 | Admitting: Primary Care

## 2020-01-05 ENCOUNTER — Other Ambulatory Visit: Payer: Self-pay | Admitting: Cardiovascular Disease

## 2020-01-05 ENCOUNTER — Telehealth: Payer: Self-pay | Admitting: Primary Care

## 2020-01-05 DIAGNOSIS — J441 Chronic obstructive pulmonary disease with (acute) exacerbation: Secondary | ICD-10-CM | POA: Diagnosis not present

## 2020-01-05 DIAGNOSIS — I739 Peripheral vascular disease, unspecified: Secondary | ICD-10-CM

## 2020-01-05 MED ORDER — PREDNISONE 10 MG PO TABS: ORAL_TABLET | ORAL | 0 refills | Status: DC

## 2020-01-05 NOTE — Telephone Encounter (Signed)
Patient informed. Copy sent to PCP °

## 2020-01-05 NOTE — Telephone Encounter (Signed)
Please make patient appointment with pharmacy this week for smoking cessation if available

## 2020-01-05 NOTE — Progress Notes (Signed)
I agree. Thanks for referring to pharmacy for help with smoking cessation.  Julian Hy, DO 01/05/20 6:22 PM [facility]

## 2020-01-05 NOTE — Telephone Encounter (Signed)
Spoke with pt. She has been scheduled for an appointment with the pharmacy team on 01/07/2020 at 1430. Nothing further was needed.

## 2020-01-05 NOTE — Progress Notes (Signed)
Virtual Visit via Video Note  I connected with Jillian [name] on 01/05/20 at  9:30 AM EDT by a video enabled telemedicine application and verified that I am speaking with the correct person using two identifiers.  Location: Patient: Home Provider: Home   I discussed the limitations of evaluation and management by telemedicine and the availability of in person appointments. The patient expressed understanding and agreed to proceed.  History of Present Illness: 54 year old female, current smoker. PMH significant for OSA, COPD, pulmonary nodules, tobacco abuse. Patient of Dr. Carlis Abbott, last seen on 12/25/19 for COPD exacerbation treated with Zpack and prednisone 40mg  x 5 days. Maintained on Advair + Spiriva, prn albuterol. PFTs ordered. If symptoms persist recommend sputum cultures.  01/05/2020 Patient contacted today for acute video visit. Patient reports shortness of breath, wheezing and cough. Associated weakness, no energy. No noticeable improvement with antibiotic. Cough is occasionally productive with yellow mucus. She hasn't tried mucinex. Getting second covid vaccine tomorrow. She still smoking 1.5ppd, having a hard time quitting. Denies fever, chills, chest or back pain, N/V/D.  Observations/Objective:  - Appears well, able to speak in full sentences - No respiratory distress  Assessment and Plan:  COPD exacerbation: - No improvement with zpack and prednisone x 5 days  - Plan extend prednisone taper (advised not to start until 12 hours after covid vaccine)  - Continue Advair hfa two puffs twice daily, Spiriva once daily and prn albuterol q6 - Recommend mucinex 600mg  twice daily - Orders: CXR, sputum culture to be completed 3/24 - Needs letter for work  Tobacco abuse - Continues smoking 1.5 ppd - Refer to pharmacy for smoking cessation   Follow Up Instructions:   - Call/return if symptoms do not improve in 5-7 days or worsen in any way  I discussed the assessment and treatment  plan with the patient. The patient was provided an opportunity to ask questions and all were answered. The patient agreed with the plan and demonstrated an understanding of the instructions.   The patient was advised to call back or seek an in-person evaluation if the symptoms worsen or if the condition fails to improve as anticipated.  I provided 18 minutes of non-face-to-face time during this encounter.   Martyn Ehrich, NP

## 2020-01-05 NOTE — Patient Instructions (Addendum)
COPD exacerbation: - Plan extend prednisone taper (advised not to start until 12 hours after covid vaccine)  - Continue Advair two puffs twice daily, Spiriva daily and  Albuterol as needed  - Recommend mucinex 600mg  twice daily - Orders: CXR, sputum culture - Needs letter for work (out 3/19-3/26)  Tobacco abuse - Refer to pharmacy for smoking cessation

## 2020-01-05 NOTE — Telephone Encounter (Signed)
The results were never routed to me.  I reviewed them just now and it appears she has vigorous cardiac function.  Very good results.

## 2020-01-05 NOTE — Telephone Encounter (Signed)
LMTCB x1 for pt.  

## 2020-01-06 ENCOUNTER — Ambulatory Visit (INDEPENDENT_AMBULATORY_CARE_PROVIDER_SITE_OTHER): Payer: 59

## 2020-01-06 ENCOUNTER — Telehealth: Payer: Self-pay | Admitting: Primary Care

## 2020-01-06 DIAGNOSIS — J441 Chronic obstructive pulmonary disease with (acute) exacerbation: Secondary | ICD-10-CM

## 2020-01-06 DIAGNOSIS — Z720 Tobacco use: Secondary | ICD-10-CM

## 2020-01-06 NOTE — Telephone Encounter (Signed)
Patient came in today for a CXR and letter for work. Provided patient with the letter stating in Beth's AVS. Nothing further needed at time of call.

## 2020-01-06 NOTE — Telephone Encounter (Signed)
Please refer to pharmacy for smoking cessation

## 2020-01-06 NOTE — Progress Notes (Signed)
   Subjective Patient presents to [facility] and seen by the pharmacist for smoking cessation counseling.   Pertinent past medical history includes tobacco abuse, COPD, pulmonary nodules, and OSA.  Patient states that both her parents were smokers.  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  She smokes in her house.  Patient states she pretty much smokes any chance that she can get.  She used to enjoy bowling which helped alleviate her stress but has not been able to due to Covid 19 restrictions.   Social History   Tobacco Use  Smoking Status Current Every Day Smoker  . Packs/day: 1.00  . Years: 55.00  . Pack years: 55.00  . Types: Cigarettes  Smokeless Tobacco Never Used     Tobacco Use History  Age when started using tobacco on a daily basis: 5th grade  Type: cigarettes and tried vaping the past  Number of cigarettes per day 1 1/2 pack  Smokes first cigarette 1 minutes after waking.  Does wake at night to smoke  Triggers include stress.  Quit Attempt History   Most recent quit attempt was 3 weeks ago.  Longest time ever been tobacco free 2 years and quit cold [place].  Methods tried in the past include Varenicline (Chantix) and patch, inhaler, gum, Chantix, Wellbutrin.   Barriers include under a lot of stress now    Immunization History  Administered Date(s) Administered  . Influenza Inj Mdck Quad Pf 07/10/2019  . Influenza Split 08/22/2018  . Influenza,inj,Quad PF,6+ Mos 07/17/2017  . Pneumococcal Conjugate-13 12/23/2019  . Pneumococcal Polysaccharide-23 10/30/2017     Assessment and Plan  1. Smoking Cessation   Patient is under a lot of stress right now.  Stress is one of her main triggers for smoking.  She does not have confidence in her ability to quit smoking but is still important to her.    Information about previous quit attempts patient mentioned she had a difficult time  with the patch.  She she continued to have a cigarette or 2 while on the patch then would be concerned about nicotine toxicity.  She would then remove the patch and discontinue her efforts.  Discussed misinformation about smoking while using the nicotine patch.  Advised that she is not at risk for nicotine toxicity if she slips of and smokes a few cigarettes while using the patch.  The nicotine patch can help decrease the number of cigarettes she smokes daily.  Patient was excited to hear this information and is interested in resuming the patch.  Patient is also interested in starting Wellbutrin.  She has several upcoming appointments and is under a lot of stress.  Patient would like to revisit setting a quit date and developing a plan to quit smoking in the next week.  Patient given educational handouts to review and will discuss more in depth at follow up visit.   Follow-up visit scheduled for 01/14/2020.  All questions encouraged and answered. Instructed patient to call with any further questions or concerns.  Thank you for allowing pharmacy to participate in this patient's care.  This appointment required 30 minutes of patient care (this includes precharting, chart review, review of results, face-to-face care, etc.).   Mariella Saa, PharmD, [place], [facility] [phone]  01/07/2020 4:35 PM

## 2020-01-07 ENCOUNTER — Encounter: Payer: Self-pay | Admitting: *Deleted

## 2020-01-07 ENCOUNTER — Other Ambulatory Visit: Payer: Self-pay

## 2020-01-07 ENCOUNTER — Telehealth (HOSPITAL_COMMUNITY): Payer: Self-pay | Admitting: Emergency Medicine

## 2020-01-07 ENCOUNTER — Telehealth: Payer: Self-pay | Admitting: Cardiovascular Disease

## 2020-01-07 ENCOUNTER — Ambulatory Visit (INDEPENDENT_AMBULATORY_CARE_PROVIDER_SITE_OTHER): Payer: 59 | Admitting: Pharmacist

## 2020-01-07 ENCOUNTER — Encounter (HOSPITAL_COMMUNITY): Payer: Self-pay

## 2020-01-07 DIAGNOSIS — F17210 Nicotine dependence, cigarettes, uncomplicated: Secondary | ICD-10-CM | POA: Diagnosis not present

## 2020-01-07 DIAGNOSIS — Z720 Tobacco use: Secondary | ICD-10-CM

## 2020-01-07 DIAGNOSIS — Z7189 Other specified counseling: Secondary | ICD-10-CM

## 2020-01-07 NOTE — Telephone Encounter (Signed)
Pt will come by and pick up letter today before 5pm

## 2020-01-07 NOTE — Progress Notes (Signed)
Please let patient know CXR showed no acute cardiopulmonary abnormality. Mild bronchial thickening.

## 2020-01-07 NOTE — Telephone Encounter (Signed)
That would be fine 

## 2020-01-07 NOTE — Telephone Encounter (Signed)
Attempted to call patient regarding upcoming cardiac CT appointment. °Left message on voicemail with name and callback number °Clyde Zarrella RN Navigator Cardiac Imaging °[facility] °[phone] Office °[phone] Cell ° °

## 2020-01-07 NOTE — Telephone Encounter (Signed)
Has a CT Cardiac tomorrow and she was wondering if DR Bronson Ing could excuse her from work Saturday or if she needs to do something else.  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

## 2020-01-08 ENCOUNTER — Ambulatory Visit (HOSPITAL_COMMUNITY)
Admission: RE | Admit: 2020-01-08 | Discharge: 2020-01-08 | Disposition: A | Discharge status: Home / Self Care | Payer: 59 | Source: Ambulatory Visit | Attending: Cardiovascular Disease | Admitting: Cardiovascular Disease

## 2020-01-08 DIAGNOSIS — I208 Other forms of angina pectoris: Secondary | ICD-10-CM

## 2020-01-08 MED ORDER — METOPROLOL TARTRATE 5 MG/5ML IV SOLN
INTRAVENOUS | Status: AC
  Filled 2020-01-08: qty 15

## 2020-01-08 MED ORDER — NITROGLYCERIN 0.4 MG SL SUBL
0.8000 mg | SUBLINGUAL_TABLET | Freq: Once | SUBLINGUAL | Status: AC
  Administered 2020-01-08: 0.8 mg via SUBLINGUAL

## 2020-01-08 MED ORDER — IOHEXOL 350 MG/ML SOLN
80.0000 mL | Freq: Once | INTRAVENOUS | Status: AC | PRN
  Administered 2020-01-08: 80 mL via INTRAVENOUS

## 2020-01-08 MED ORDER — NITROGLYCERIN 0.4 MG SL SUBL
SUBLINGUAL_TABLET | SUBLINGUAL | Status: AC
  Filled 2020-01-08: qty 2

## 2020-01-08 MED ORDER — METOPROLOL TARTRATE 5 MG/5ML IV SOLN: 5.0000 mg | INTRAVENOUS | Status: DC | PRN

## 2020-01-08 NOTE — Telephone Encounter (Signed)
Patient was referred to Pharmacy for smoking cessation Per Derl Barrow.

## 2020-01-08 NOTE — Progress Notes (Deleted)
Subjective Patient presents to [facility] and seen by the pharmacist for smoking cessation counseling.   Patient was referred and last seen by  *** on ***.    She is interested in Wellbutrin and nicotine patch.  Social History   Tobacco Use  Smoking Status Current Every Day Smoker  . Packs/day: 1.00  . Years: 55.00  . Pack years: 55.00  . Types: Cigarettes  Smokeless Tobacco Never Used     Immunization History  Administered Date(s) Administered  . Influenza Inj Mdck Quad Pf 07/10/2019  . Influenza Split 08/22/2018  . Influenza,inj,Quad PF,6+ Mos 07/17/2017  . Pneumococcal Conjugate-13 12/23/2019  . Pneumococcal Polysaccharide-23 10/30/2017     Assessment and Plan  1. Smoking Cessation  a. Provided information on 1 800-QUIT NOW support program.  b. Non-Pharmacologic c. Pharmacologic i. Initiated nicotine replacement tx with ***. Patient counseled on purpose, proper use, and potential adverse effects.  1. Nicotine Patch a. Patient counseled on purpose, proper use, and potential adverse effects, including mild itching or redness at the point of application, headache, trouble sleeping, and/or vivid dreams    b. Patch Schedule for >10 cigarettes daily i. Weeks 1-6: one 21 mg patch daily ii. Weeks 7-8: one 14 mg patch daily iii. Weeks 9-10: one 7 mg patch daily c. Patch Schedule for <10 cigarettes daily i. Weeks 1 to 6: one nicotine patch (14 mg) daily. I will call and re-assess how you are doing at the end of 6 weeks to see how you are doing on 14 mg patch and if you are ready to decrease dose of patch. ii. Weeks 7-8: one nicotine patch (7 mg) daily 2. Nicotine Lozenge a. Patient counseled on purpose, proper use, and potential adverse effects including nausea, hiccups, cough, and heartburn.  Instructed patient to use  2 mg unless the smoke within 30 minutes of waking up in which they should use 4 mg. b. Lozenge dosing schedule i. Weeks 1 to 6: 1 lozenge every 1 to 2  hours (maximum: 5 lozenges every 6 hours; 20 lozenges/day); to increase chances of quitting, use at least 9 lozenges/day during the first 6 weeks ii. Weeks 7 to 9: 1 lozenge every 2 to 4 hours (maximum: 5 lozenges every 6 hours; 20 lozenges/day) iii. Weeks 10 to 12: 1 lozenge every 4 to 8 hours (maximum: 5 lozenges every 6 hours; 20 lozenges/day) 3. Nicotine Gum a. Patient counseled on purpose, proper use, and potential adverse effects including jaw soreness and upset stomach if swallowing saliva.  Instructed patient to use 4 mg if they smoke a pack a day or more and 2 mg if they smoke less than a pack a day. b. Gum dosing schedule i. Weeks 1 to 6: Chew 1 piece of gum every 1 to 2 hours (maximum: 24 pieces/day); to increase chances of quitting, chew at least 9 pieces/day during the first 6 weeks ii. Weeks 7 to 9: Chew 1 piece of gum every 2 to 4 hours (maximum: 24 pieces/day) iii. Weeks 10 to 12: Chew 1 piece of gum every 4 to 8 hours (maximum: 24 pieces/day) 4. Bupropion a. Initiated bupropion ***. Patient with no PMH of seizures. Patient counseled on purpose, proper use, and potential adverse effects, including insomnia, and potential change in mood.  5. Varenicline a. Initiated varenicline titration of 0.5 mg by mouth once daily with food x3 days, then 0.5 mg by mouth twice daily with food x4 days, then 1 mg by mouth twice daily with food  thereafter.  CrCL greater than 30 mL/min. Patient counseled on purpose, proper use, and potential adverse effects, including GI upset, and potential change in mood.  2. Medication Reconciliation a. A drug regimen assessment was performed, including review of allergies, interactions, disease-state management, dosing and immunization history. Medications were reviewed with the patient, including name, instructions, indication, goals of therapy, potential side effects, importance of adherence, and safe use. 3. Immunizations a. Patient is indicated for influenzae,  pneumonia, and shingles vaccinations.   This appointment required *** minutes of patient care (this includes precharting, chart review, review of results, face-to-face care, etc.).

## 2020-01-11 ENCOUNTER — Telehealth: Payer: Self-pay | Admitting: *Deleted

## 2020-01-11 ENCOUNTER — Other Ambulatory Visit: Payer: Self-pay | Admitting: Critical Care Medicine

## 2020-01-11 MED ORDER — ALBUTEROL SULFATE HFA 108 (90 BASE) MCG/ACT IN AERS: 2.0000 | INHALATION_SPRAY | RESPIRATORY_TRACT | 3 refills | Status: DC | PRN

## 2020-01-11 MED ORDER — TIOTROPIUM BROMIDE MONOHYDRATE 18 MCG IN CAPS: 18.0000 ug | ORAL_CAPSULE | Freq: Every day | RESPIRATORY_TRACT | 3 refills | Status: DC

## 2020-01-11 NOTE — Progress Notes (Signed)
Spiriva and albuterol refilled.  Julian Hy, DO 01/11/20 7:39 PM [facility]

## 2020-01-11 NOTE — Telephone Encounter (Signed)
Patient informed. Copy sent to PCP °

## 2020-01-11 NOTE — Telephone Encounter (Signed)
Dr. Carlis Abbott, Please advise on patients mychart message:  Hi Dr. Carlis Abbott. When I went for the CT Scan at [facility]. From the parking garage to the Imaging Dept, I had to stop twice due to not being able to breath. My heart was beating so fast and hard. I was glad I was at hospital,thought I might pass out. This really wore me out. When I got home I ate supper and was in bed by 7:30.  Slept all night and woke up around 1 to eat and take meds and was still so tired that I was back in bed at 3:30 and slept till Sunday morning at 8:00 am. Stayed sluggish feeling all day not breathing well and then couldn't  sleep Sunday night. Today, terrible headache and chest is tight. Not Heavy, just tight. ?. Can you please send in prescriptions for Spiriva and Albuterol. 3 month supplies (insurance). Thank you

## 2020-01-11 NOTE — Telephone Encounter (Signed)
-----   Message from Herminio Commons, MD sent at 01/11/2020  8:45 AM EDT ----- Very mild blockage in one artery.  I suspect shortness of breath is likely related to COPD from longstanding tobacco use.  I will speak to her further at next appointment.

## 2020-01-12 ENCOUNTER — Other Ambulatory Visit: Payer: Self-pay

## 2020-01-12 MED ORDER — ALBUTEROL SULFATE HFA 108 (90 BASE) MCG/ACT IN AERS: 2.0000 | INHALATION_SPRAY | RESPIRATORY_TRACT | 3 refills | Status: DC | PRN

## 2020-01-12 NOTE — Telephone Encounter (Signed)
Called and informed patient that scripts were sent into pharmacy yesterday per [place]. Called pharmacy to verify that they received them. Spoke with Sherren Mocha who confirmed they received them and are ready for patient pick up. Patient notifed. Nothing further needed at this time.

## 2020-01-12 NOTE — Telephone Encounter (Signed)
Dr. Carlis Abbott, I called and spoke with patient about her symptoms. She states that everything from the other day is mostly resolved except for her breathing. She states she is just having a lot of issues. Patients PFT and office visit have been moved up to next week so that we can get results sooner and hopefully get her feeling better. PFT is 4/5 and OV on 4/6. Patient was very pleased with these options. She is also requesting that we still send in the prescriptions/refills for her if your still ok with doing this?  Let me know if we need anything else!!

## 2020-01-12 NOTE — Telephone Encounter (Signed)
Please see previous phone message.  

## 2020-01-14 ENCOUNTER — Ambulatory Visit: Payer: 59

## 2020-01-15 ENCOUNTER — Other Ambulatory Visit (HOSPITAL_COMMUNITY)
Admission: RE | Admit: 2020-01-15 | Discharge: 2020-01-15 | Disposition: A | Discharge status: Home / Self Care | Payer: 59 | Source: Ambulatory Visit | Attending: Acute Care | Admitting: Acute Care

## 2020-01-15 DIAGNOSIS — Z20822 Contact with and (suspected) exposure to covid-19: Secondary | ICD-10-CM | POA: Insufficient documentation

## 2020-01-15 DIAGNOSIS — Z01812 Encounter for preprocedural laboratory examination: Secondary | ICD-10-CM | POA: Diagnosis not present

## 2020-01-16 LAB — SARS CORONAVIRUS 2 (TAT 6-24 HRS): SARS Coronavirus 2: NEGATIVE

## 2020-01-18 ENCOUNTER — Ambulatory Visit (INDEPENDENT_AMBULATORY_CARE_PROVIDER_SITE_OTHER): Payer: 59 | Admitting: Critical Care Medicine

## 2020-01-18 ENCOUNTER — Other Ambulatory Visit: Payer: Self-pay

## 2020-01-18 ENCOUNTER — Ambulatory Visit (INDEPENDENT_AMBULATORY_CARE_PROVIDER_SITE_OTHER): Payer: 59 | Admitting: Pharmacist

## 2020-01-18 DIAGNOSIS — F17210 Nicotine dependence, cigarettes, uncomplicated: Secondary | ICD-10-CM | POA: Diagnosis not present

## 2020-01-18 DIAGNOSIS — R0609 Other forms of dyspnea: Secondary | ICD-10-CM

## 2020-01-18 DIAGNOSIS — R0600 Dyspnea, unspecified: Secondary | ICD-10-CM

## 2020-01-18 DIAGNOSIS — Z720 Tobacco use: Secondary | ICD-10-CM

## 2020-01-18 LAB — PULMONARY FUNCTION TEST
DL/VA % pred: 71 %
DL/VA: 3.07 ml/min/mmHg/L
DLCO cor % pred: 65 %
DLCO cor: 12.78 ml/min/mmHg
DLCO unc % pred: 69 %
DLCO unc: 13.53 ml/min/mmHg
FEF 25-75 Post: 1.67 L/sec
FEF 25-75 Pre: 0.53 L/sec
FEF2575-%Change-Post: 215 %
FEF2575-%Pred-Post: 68 %
FEF2575-%Pred-Pre: 21 %
FEV1-%Change-Post: 35 %
FEV1-%Pred-Post: 56 %
FEV1-%Pred-Pre: 41 %
FEV1-Post: 1.43 L
FEV1-Pre: 1.06 L
FEV1FVC-%Change-Post: 5 %
FEV1FVC-%Pred-Pre: 84 %
FEV6-%Change-Post: 28 %
FEV6-%Pred-Post: 64 %
FEV6-%Pred-Pre: 50 %
FEV6-Post: 2.03 L
FEV6-Pre: 1.58 L
FEV6FVC-%Pred-Post: 103 %
FEV6FVC-%Pred-Pre: 103 %
FVC-%Change-Post: 28 %
FVC-%Pred-Post: 63 %
FVC-%Pred-Pre: 49 %
FVC-Post: 2.04 L
FVC-Pre: 1.58 L
Post FEV1/FVC ratio: 70 %
Post FEV6/FVC ratio: 100 %
Pre FEV1/FVC ratio: 67 %
Pre FEV6/FVC Ratio: 100 %
RV % pred: 144 %
RV: 2.66 L
TLC % pred: 89 %
TLC: 4.34 L

## 2020-01-18 MED ORDER — ZOSTER VAC RECOMB ADJUVANTED 50 MCG/0.5ML IM SUSR: 0.5000 mL | Freq: Once | INTRAMUSCULAR | 1 refills | Status: AC

## 2020-01-18 MED ORDER — BUPROPION HCL ER (XL) 150 MG PO TB24: 150.0000 mg | ORAL_TABLET | Freq: Every day | ORAL | 0 refills | Status: DC

## 2020-01-18 MED ORDER — BUPROPION HCL ER (XL) 300 MG PO TB24: 300.0000 mg | ORAL_TABLET | Freq: Every day | ORAL | 3 refills | Status: DC

## 2020-01-18 NOTE — Progress Notes (Signed)
Subjective Patient presents for follow up appt to [facility] and seen by the pharmacist for smoking cessation counseling.   Pertinent past medical history includes tobacco abuse, COPD, pulmonary nodules, and OSA.  Patient states that both her parents were smokers.  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  She smokes in her house.  Patient states she pretty much smokes any chance that she can get.  She used to enjoy bowling which helped alleviate her stress but has not been able to due to Covid 19 restrictions.  Patient is interested in using bupropion, nicotine patch, and nicotine lozenge to help quit smoking. She is waiting to determine quit date based on if she will be able to go back to work or not. Upon medication reconciliation, patient admits to taking 5-6 puffs with Spiriva Handihaler because she feels she isn't "getting full dose of medication". She is unsure if she is breathing appropriately with Spiriva Handihaler. She confirms she is taking 2 puffs twice daily of Symbicort and rinses mouth out afterwards.    Social History   Tobacco Use  Smoking Status Current Every Day Smoker  . Packs/day: 1.00  . Years: 55.00  . Pack years: 55.00  . Types: Cigarettes  Smokeless Tobacco Never Used     Tobacco Use History  Age when started using tobacco on a daily basis: 5th grade  Type: cigarettes and tried vaping the past  Number of cigarettes per day 1 1/2 pack  Smokes first cigarette 1 minutes after waking.  Does wake at night to smoke  Triggers include stress.  Quit Attempt History   Most recent quit attempt was 3 weeks ago.  Longest time ever been tobacco free 2 years and quit cold [place].  Methods tried in the past include Varenicline (Chantix) and patch, inhaler, gum, Chantix, Wellbutrin.   Barriers include under a lot of stress now   Immunization History  Administered Date(s)  Administered  . Influenza Inj Mdck Quad Pf 07/10/2019  . Influenza Split 08/22/2018  . Influenza,inj,Quad PF,6+ Mos 07/17/2017  . Pneumococcal Conjugate-13 12/23/2019  . Pneumococcal Polysaccharide-23 10/30/2017     Assessment and Plan  1. Smoking Cessation  a. Provided information on 1 800-QUIT NOW support program.  b. Non-Pharmacologic c. Pharmacologic i. Initiated nicotine replacement tx with bupropion XL daily, nicotine patch 21 mg daily, and nicotine lozenge 4 mg. Patient counseled on purpose, proper use, and potential adverse effects. Will follow up with patient in 1 month to re-assess quit attempt.  1. Nicotine Patch a. Patient counseled on purpose, proper use, and potential adverse effects, including mild itching or redness at the point of application, headache, trouble sleeping, and/or vivid dreams    b. Patch Schedule for >10 cigarettes daily i. Weeks 1-6: one 21 mg patch daily ii. Weeks 7-8: one 14 mg patch daily iii. Weeks 9-10: one 7 mg patch daily c. Patch Schedule for <10 cigarettes daily i. Weeks 1 to 6: one nicotine patch (14 mg) daily. I will call and re-assess how you are doing at the end of 6 weeks to see how you are doing on 14 mg patch and if you are ready to decrease dose of patch. ii. Weeks 7-8: one nicotine patch (7 mg) daily 2. Nicotine Lozenge a. Patient counseled on purpose, proper use, and potential adverse effects including nausea, hiccups, cough, and heartburn.  Instructed patient to use  2 mg unless the  smoke within 30 minutes of waking up in which they should use 4 mg. b. Lozenge dosing schedule i. Weeks 1 to 6: 1 lozenge every 1 to 2 hours (maximum: 5 lozenges every 6 hours; 20 lozenges/day); to increase chances of quitting, use at least 9 lozenges/day during the first 6 weeks ii. Weeks 7 to 9: 1 lozenge every 2 to 4 hours (maximum: 5 lozenges every 6 hours; 20 lozenges/day) iii. Weeks 10 to 12: 1 lozenge every 4 to 8 hours (maximum: 5 lozenges every 6  hours; 20 lozenges/day) 3. Bupropion a. Initiated bupropion XL 150 mg daily x 1 week then 300 mg daily afterwards. Patient with no PMH of seizures. Patient counseled on purpose, proper use, and potential adverse effects, including insomnia, and potential change in mood.   2. Medication Reconciliation a. A drug regimen assessment was performed, including review of allergies, interactions, disease-state management, dosing and immunization history. Medications were reviewed with the patient, including name, instructions, indication, goals of therapy, potential side effects, importance of adherence, and safe use. i. Spiriva Handihaler - Thoroughly counseled patient on inhaler use and stressed dose is 2 puffs once daily. Inspiratory flow measured using the In-check DIAL G16 and was in range of 30-90 L/min for use of high resistance DPI device (Handihaler). Optimal range is greater than 30 L/min.  Patient scored 80 upon retraining. Patient expressed appreciation for training.  3. Immunizations a. Patient is indicated for the shingles vaccination. Sent in rx to patient's preferred pharmacy.  This appointment required 40 minutes of patient care (this includes precharting, chart review, review of results, face-to-face care, etc.).  Thank you for involving pharmacy to assist in providing this patient's care.   Drexel Iha, PharmD PGY2 Ambulatory Care Pharmacy Resident

## 2020-01-18 NOTE — Progress Notes (Signed)
PFT done today. 

## 2020-01-18 NOTE — Patient Instructions (Addendum)
It was a pleasure seeing you in clinic today [name]!  Today the plan is... 1. Start using bupropion, nicotine patch, and nicotine cough drop. Remember for bupropion - take 150 mg tablet once daily for 1 week then increase to 300 mg tablet once daily. Bupropion prescription will be available for pick up at the pharmacy. Call [place] for nicotine patch and nicotine lozenge. Try to use 9 nicotine cough drops per day. 2. You can buy skin tac (helps stick) and/or tac away (helps take off adhesive) when wearing the patch. 3. Shingles vaccine prescription is sent to [facility] pharmacy.  Please call the PharmD clinic at [phone] if you have any questions that you would like to speak with a pharmacist about Jillian [name], Museum/gallery conservator).

## 2020-01-19 ENCOUNTER — Ambulatory Visit (INDEPENDENT_AMBULATORY_CARE_PROVIDER_SITE_OTHER): Payer: 59 | Admitting: Critical Care Medicine

## 2020-01-19 ENCOUNTER — Other Ambulatory Visit: Payer: Self-pay

## 2020-01-19 ENCOUNTER — Telehealth (HOSPITAL_COMMUNITY): Payer: Self-pay | Admitting: *Deleted

## 2020-01-19 ENCOUNTER — Encounter: Payer: Self-pay | Admitting: Critical Care Medicine

## 2020-01-19 VITALS — BP 110/54 | HR 94 | Temp 97.3°F | Ht 62.0 in | Wt 183.0 lb

## 2020-01-19 DIAGNOSIS — R0609 Other forms of dyspnea: Secondary | ICD-10-CM

## 2020-01-19 DIAGNOSIS — R918 Other nonspecific abnormal finding of lung field: Secondary | ICD-10-CM

## 2020-01-19 DIAGNOSIS — R0600 Dyspnea, unspecified: Secondary | ICD-10-CM

## 2020-01-19 DIAGNOSIS — J42 Unspecified chronic bronchitis: Secondary | ICD-10-CM

## 2020-01-19 DIAGNOSIS — J441 Chronic obstructive pulmonary disease with (acute) exacerbation: Secondary | ICD-10-CM

## 2020-01-19 DIAGNOSIS — Z720 Tobacco use: Secondary | ICD-10-CM | POA: Diagnosis not present

## 2020-01-19 DIAGNOSIS — J309 Allergic rhinitis, unspecified: Secondary | ICD-10-CM | POA: Diagnosis not present

## 2020-01-19 MED ORDER — SPIRIVA RESPIMAT 2.5 MCG/ACT IN AERS: 2.0000 | INHALATION_SPRAY | Freq: Every day | RESPIRATORY_TRACT | 3 refills | Status: DC

## 2020-01-19 MED ORDER — AZELASTINE HCL 0.1 % NA SOLN: 2.0000 | Freq: Two times a day (BID) | NASAL | 11 refills | Status: DC

## 2020-01-19 NOTE — Progress Notes (Signed)
Synopsis: Referred in February 2021 for COPD, lung cancer screening by Neale Burly, MD.  Subjective:   PATIENT ID: Jillian [name] GENDER: female DOB: 05/24/1966, MRN: [id number]  Chief Complaint  Patient presents with  . Follow-up    Patient is still having trouble breathing. Had PFT on 4/5, CXR 3/24 and CT 3/26. Patient states that anytime Jillian [name] gets up and moves and exerts herself Jillian [name] has trouble breathing. Patient has met with pharmacy team to try and stop smoking.    Jillian [name] is a 54 year old woman with a history of COPD who presents for follow-up.  Jillian [name] was seen yesterday by her pharmacy team for help with smoking cessation.  Jillian [name] is using nicotine patches and lozenges as needed.  Jillian [name] will be starting those this week.  Jillian [name] was previously tried using patches, but struggled when Jillian [name] had a cigarette because Jillian [name] would pull the patch off and felt that Jillian [name] had given up for the day.  Jillian [name] is working on cutting back on smoking.  Jillian [name] is can be starting low-dose bupropion this week as well.  Jillian [name] continues on Spiriva daily and Advair 2 puffs twice daily.  Jillian [name] is unsure if Jillian [name] is using the Spiriva correctly and getting all the medication out of the HandiHaler.  Jillian [name] continues to use her albuterol 2-4 times per day.  Jillian [name] has been having a lot of sinus drainage, for which Jillian [name] takes Claritin and Nasonex, but Jillian [name] has only been using Nasonex as needed.  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  No significant wheezing or cough.  Jillian [name] has chronic GERD which is improved with Protonix and dietary changes.    CT coronary 01/08/2020- persistent right middle lobe subpleural nodule, decreased in size compared to previous.  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  OV 12/23/19: Jillian [name] is a 54 y/o woman being seen in follow up  for COPD and tobacco abuse. Jillian [name] was seen in February for an acute exacerbation treated with prednisone and antibiotics. Jillian [name] feels much better and no longer has chest pressure; previously Jillian [name] had chest tightness going around her chest. Jillian [name] continues on Spiriva daily and Advair BID. Jillian [name] does not use albuterol because it does not helps her symptoms and gives her a chest heaviness. Jillian [name] has an ongoing cough but mild sputum production. Jillian [name] has wheezing at night mostly. Jillian [name] denies DOE but can only walk to her mailbox and back before stopping due to dyspnea and Jillian [name] walks slower than her peers.  Jillian [name] is a current every day smoker with a 58-pack-year history. Jillian [name] smokes 1+ppd currently, which is her heaviest. Jillian [name] has tried chantix in the past but stopped due to chest pressure like an "elephant sitting on my chest". Nicotine inhaler and gum didn't help, and Jillian [name] continued to smoke on her patches. Jillian [name] did not tolerate Wellbutrin well. Jillian [name] has patches and is interested in trying them again. Anxiety is contributing to her difficulty quitting.   Past Medical History:  Diagnosis Date  . Anxiety   . Arthritis   . Asthma   . Depression   . High blood pressure   . History of bladder problems   . Migraines   . Sleep apnea      Family History  Problem Relation  Age of Onset  . Diabetes Mother   . COPD Mother   . Heart attack Father   . Diabetes Sister   . Colon cancer Neg Hx   . Celiac disease Neg Hx   . Inflammatory bowel disease Neg Hx      Past Surgical History:  Procedure Laterality Date  . APPENDECTOMY    . BLADDER SURGERY     x3  . COLONOSCOPY  06/2018   Dr. Therisa Doyne: Normal terminal ileum with normal biopsies.  Evidence of prior end to side ileocolonic anastomosis in the cecum.  This was patent and was characterized by healthy-appearing mucosa.  Multiple small and large mouth diverticula noted in the sigmoid colon and descending colon.  Random colon biopsies were benign.  Recommended colonoscopy in 10  years.  . ileum resection  1982  . LAPAROSCOPIC CHOLECYSTECTOMY    . PARTIAL HYSTERECTOMY      Social History   Socioeconomic History  . Marital status: Divorced    Spouse name: Not on file  . Number of children: Not on file  . Years of education: Not on file  . Highest education level: Not on file  Occupational History  . Not on file  Tobacco Use  . Smoking status: Current Every Day Smoker    Packs/day: 1.00    Years: 55.00    Pack years: 55.00    Types: Cigarettes  . Smokeless tobacco: Never Used  Substance and Sexual Activity  . Alcohol use: Yes    Comment: hardly ever  . Drug use: No  . Sexual activity: Not on file  Other Topics Concern  . Not on file  Social History Narrative  . Not on file   Social Determinants of Health   Financial Resource Strain:   . Difficulty of Paying Living Expenses:   Food Insecurity:   . Worried About Charity fundraiser in the Last Year:   . Arboriculturist in the Last Year:   Transportation Needs:   . Film/video editor (Medical):   Marland Kitchen Lack of Transportation (Non-Medical):   Physical Activity:   . Days of Exercise per Week:   . Minutes of Exercise per Session:   Stress:   . Feeling of Stress :   Social Connections:   . Frequency of Communication with Friends and Family:   . Frequency of Social Gatherings with Friends and Family:   . Attends Religious Services:   . Active Member of Clubs or Organizations:   . Attends Archivist Meetings:   Marland Kitchen Marital Status:   Intimate Partner Violence:   . Fear of Current or Ex-Partner:   . Emotionally Abused:   Marland Kitchen Physically Abused:   . Sexually Abused:      Allergies  Allergen Reactions  . Doxycycline Photosensitivity    Broke out with blisters after being in the sun  . Banana Hives     Immunization History  Administered Date(s) Administered  . Influenza Inj Mdck Quad Pf 07/10/2019  . Influenza Split 08/22/2018  . Influenza,inj,Quad PF,6+ Mos 07/17/2017  .  Pneumococcal Conjugate-13 12/23/2019  . Pneumococcal Polysaccharide-23 10/30/2017    Outpatient Medications Prior to Visit  Medication Sig Dispense Refill  . albuterol (VENTOLIN HFA) 108 (90 Base) MCG/ACT inhaler Inhale 2 puffs into the lungs every 4 (four) hours as needed for wheezing or shortness of breath. 48 g 3  . Aspirin-Acetaminophen-Caffeine (GOODY HEADACHE PO) Take by mouth as needed.    Marland Kitchen buPROPion (WELLBUTRIN XL) 150  MG 24 hr tablet Take 1 tablet (150 mg total) by mouth daily. 7 tablet 0  . buPROPion (WELLBUTRIN XL) 300 MG 24 hr tablet Take 1 tablet (300 mg total) by mouth daily. 90 tablet 3  . DULoxetine (CYMBALTA) 60 MG capsule Take 60 mg by mouth 2 (two) times daily.     Marland Kitchen ELDERBERRY PO Take 2 capsules by mouth daily.    . fluticasone-salmeterol (ADVAIR HFA) 115-21 MCG/ACT inhaler Inhale 2 puffs into the lungs 2 (two) times daily.    Marland Kitchen loratadine (CLARITIN) 10 MG tablet Take 10 mg by mouth daily.    . Melatonin 1 MG CHEW Chew 1 tablet by mouth daily.    . mometasone (NASONEX) 50 MCG/ACT nasal spray Place 1 spray into the nose as needed.    . nicotine (NICODERM CQ - DOSED IN MG/24 HOURS) 21 mg/24hr patch Place 21 mg onto the skin daily.    . nicotine polacrilex (NICOTINE MINI) 4 MG lozenge Take 4 mg by mouth as needed for smoking cessation.    . Omega-3 Fatty Acids ([employer] FISH OIL) 1000 MG CAPS Take 1 capsule by mouth in the morning and at bedtime.     . pantoprazole (PROTONIX) 40 MG tablet Take 1 tablet by mouth 2 (two) times daily before a meal.     . RESTASIS 0.05 % ophthalmic emulsion 1 drop 2 (two) times daily. In both eyes    . Sodium Bicarbonate-Citric Acid (ALKA-SELTZER HEARTBURN PO) Take by mouth daily.    . valsartan-hydrochlorothiazide (DIOVAN-HCT) 160-12.5 MG tablet Take 1 tablet by mouth daily.    . vitamin B-12 (CYANOCOBALAMIN) 1000 MCG tablet Take 1,000 mcg by mouth daily.    . Vitamin D, Ergocalciferol, (DRISDOL) 1.25 MG (50000 UT) CAPS capsule Take 1 capsule by  mouth once a week.    . tiotropium (SPIRIVA) 18 MCG inhalation capsule Place 1 capsule (18 mcg total) into inhaler and inhale daily. 90 capsule 3   No facility-administered medications prior to visit.    Review of Systems  Constitutional: Negative for chills and fever.  Respiratory: Positive for cough, shortness of breath and wheezing.   Cardiovascular: Negative for chest pain.  Gastrointestinal: Negative for heartburn, nausea and vomiting.  Endo/Heme/Allergies: Positive for environmental allergies.     Objective:   Vitals:   01/19/20 0931  BP: (!) 110/54  Pulse: 94  Temp: (!) 97.3 F (36.3 C)  TempSrc: Temporal  SpO2: 98%  Weight: 183 lb (83 kg)  Height: '5\' 2"'$  (1.575 m)   98% on   RA BMI Readings from Last 3 Encounters:  01/19/20 33.47 kg/m  12/23/19 32.15 kg/m  12/17/19 32.19 kg/m   Wt Readings from Last 3 Encounters:  01/19/20 183 lb (83 kg)  12/23/19 175 lb 12.8 oz (79.7 kg)  12/17/19 176 lb (79.8 kg)    Physical Exam Vitals reviewed.  Constitutional:      Appearance: Jillian [name] is obese. Jillian [name] is not ill-appearing.  HENT:     Head: Normocephalic and atraumatic.  Eyes:     General: No scleral icterus. Cardiovascular:     Rate and Rhythm: Normal rate and regular rhythm.     Heart sounds: No murmur.  Pulmonary:     Comments: Breathing comfortably on room air, no conversational dyspnea.  Prolonged exhalation, faint basilar expiratory wheezing. Abdominal:     General: There is no distension.     Palpations: Abdomen is soft.  Musculoskeletal:        General: No swelling or deformity.  Cervical back: Neck supple.  Lymphadenopathy:     Cervical: No cervical adenopathy.  Skin:    General: Skin is warm and dry.     Findings: No rash.  Neurological:     General: No focal deficit present.     Mental Status: Jillian [name] is alert.     Coordination: Coordination normal.  Psychiatric:        Mood and Affect: Mood normal.        Behavior: Behavior normal.       CBC    Component Value Date/Time   WBC 13.0 (H) 11/25/2019 1540   RBC 5.17 (H) 11/25/2019 1540   HGB 15.5 (H) 11/25/2019 1540   HGB 14.9 08/22/2017 0849   HCT 47.4 (H) 11/25/2019 1540   HCT 44.5 08/22/2017 0849   PLT 313.0 11/25/2019 1540   MCV 91.8 11/25/2019 1540   MCV 96 08/22/2017 0849   MCH 31.7 07/10/2019 1125   MCHC 32.6 11/25/2019 1540   RDW 15.5 11/25/2019 1540   RDW 14.7 08/22/2017 0849   LYMPHSABS 2.7 11/25/2019 1540   LYMPHSABS 2.3 08/22/2017 0849   MONOABS 1.1 (H) 11/25/2019 1540   EOSABS 1.2 (H) 11/25/2019 1540   EOSABS 1.0 (H) 08/22/2017 0849   BASOSABS 0.2 (H) 11/25/2019 1540   BASOSABS 0.1 08/22/2017 0849    CHEMISTRY No results for input(s): NA, K, CL, CO2, GLUCOSE, BUN, CREATININE, CALCIUM, MG, PHOS in the last 168 hours. CrCl cannot be calculated (Patient's most recent lab result is older than the maximum 21 days allowed.).   Chest Imaging- films reviewed: CT chest 09/30/2019-emphysema, upper lobe predominant.  Subpleural RML nodule with linear scar. No new nodules compared to July 2020.  CT coronary 01/08/2020- persistent right middle lobe subpleural nodule, decreased in size compared to previous.  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  Pulmonary Functions Testing Results: PFT Results Latest Ref Rng & Units 01/18/2020  FVC-Pre L 1.58  FVC-Predicted Pre % 49  FVC-Post L 2.04  FVC-Predicted Post % 63  Pre FEV1/FVC % % 67  Post FEV1/FCV % % 70  FEV1-Pre L 1.06  FEV1-Predicted Pre % 41  FEV1-Post L 1.43  DLCO UNC% % 69  DLCO COR %Predicted % 71  TLC L 4.34  TLC % Predicted % 89  RV % Predicted % 144    2021-severe obstruction with significant bronchodilator reversibility. Air-trapping is present.  Mildly impaired diffusion capacity.  Flow volume loop suggests mixed obstruction and restriction.  Echocardiogram: pending      Assessment & Plan:     ICD-10-CM   1.  Pulmonary nodules  R91.8   2. Chronic bronchitis, unspecified chronic bronchitis type ([place])  J42 AMB referral to pulmonary rehabilitation  3. Tobacco abuse  Z72.0 AMB referral to pulmonary rehabilitation  4. Allergic rhinitis, unspecified seasonality, unspecified trigger  J30.9   5. DOE (dyspnea on exertion)  R06.00 AMB referral to pulmonary rehabilitation    COPD and asthma overlap. 1 recent exacerbation. -PFTs reviewed in the office today. -Continue Advair twice daily.  Switching Spiriva to Respimat rather than HandiHaler.  Reviewed inhaler technique. -Continue albuterol as needed -Discussed the importance of complete smoking cessation. -Continue albuterol as needed -Up-to-date on pneumonia vaccines. -Recommend Covid vaccine.  Allergic rhinitis -Continue Nasonex and Claritin every day.  Need to escalate Claritin to Zyrtec.  Tobacco abuse -Encouraged her efforts at ongoing tobacco cessation.  Discussed the importance of reducing her smoking as this can  be significantly beneficial to her health as well.  Jillian [name] should continue her efforts even if Jillian [name] has days where Jillian [name] is not as successful. -Appreciate pharmacy's assistance with her ongoing counseling in efforts.  Pulmonary nodule, high risk tobacco history -Follow-up chest CT in 12 to 18 months (December 2021 to June 2022)--previously ordered  GERD -Continue Protonix and dietary modifications  High risk for CAD- Coronary CT low risk -Recommend smoking cessation    Current Outpatient Medications:  .  albuterol (VENTOLIN HFA) 108 (90 Base) MCG/ACT inhaler, Inhale 2 puffs into the lungs every 4 (four) hours as needed for wheezing or shortness of breath., Disp: 48 g, Rfl: 3 .  Aspirin-Acetaminophen-Caffeine (GOODY HEADACHE PO), Take by mouth as needed., Disp: , Rfl:  .  azelastine (ASTELIN) 0.1 % nasal spray, Place 2 sprays into both nostrils 2 (two) times daily. Use in each nostril as directed, Disp: 30 mL, Rfl: 11 .  buPROPion  (WELLBUTRIN XL) 150 MG 24 hr tablet, Take 1 tablet (150 mg total) by mouth daily., Disp: 7 tablet, Rfl: 0 .  buPROPion (WELLBUTRIN XL) 300 MG 24 hr tablet, Take 1 tablet (300 mg total) by mouth daily., Disp: 90 tablet, Rfl: 3 .  DULoxetine (CYMBALTA) 60 MG capsule, Take 60 mg by mouth 2 (two) times daily. , Disp: , Rfl:  .  ELDERBERRY PO, Take 2 capsules by mouth daily., Disp: , Rfl:  .  fluticasone-salmeterol (ADVAIR HFA) 115-21 MCG/ACT inhaler, Inhale 2 puffs into the lungs 2 (two) times daily., Disp: , Rfl:  .  loratadine (CLARITIN) 10 MG tablet, Take 10 mg by mouth daily., Disp: , Rfl:  .  Melatonin 1 MG CHEW, Chew 1 tablet by mouth daily., Disp: , Rfl:  .  mometasone (NASONEX) 50 MCG/ACT nasal spray, Place 1 spray into the nose as needed., Disp: , Rfl:  .  nicotine (NICODERM CQ - DOSED IN MG/24 HOURS) 21 mg/24hr patch, Place 21 mg onto the skin daily., Disp: , Rfl:  .  nicotine polacrilex (NICOTINE MINI) 4 MG lozenge, Take 4 mg by mouth as needed for smoking cessation., Disp: , Rfl:  .  Omega-3 Fatty Acids ([employer] FISH OIL) 1000 MG CAPS, Take 1 capsule by mouth in the morning and at bedtime. , Disp: , Rfl:  .  pantoprazole (PROTONIX) 40 MG tablet, Take 1 tablet by mouth 2 (two) times daily before a meal. , Disp: , Rfl:  .  RESTASIS 0.05 % ophthalmic emulsion, 1 drop 2 (two) times daily. In both eyes, Disp: , Rfl:  .  Sodium Bicarbonate-Citric Acid (ALKA-SELTZER HEARTBURN PO), Take by mouth daily., Disp: , Rfl:  .  Tiotropium Bromide Monohydrate (SPIRIVA RESPIMAT) 2.5 MCG/ACT AERS, Inhale 2 puffs into the lungs daily., Disp: 12 g, Rfl: 3 .  valsartan-hydrochlorothiazide (DIOVAN-HCT) 160-12.5 MG tablet, Take 1 tablet by mouth daily., Disp: , Rfl:  .  vitamin B-12 (CYANOCOBALAMIN) 1000 MCG tablet, Take 1,000 mcg by mouth daily., Disp: , Rfl:  .  Vitamin D, Ergocalciferol, (DRISDOL) 1.25 MG (50000 UT) CAPS capsule, Take 1 capsule by mouth once a week., Disp: , Rfl:     Julian Hy, DO [facility] 01/19/2020 10:18 AM

## 2020-01-19 NOTE — Patient Instructions (Addendum)
Thank you for visiting Dr. Carlis Abbott at [facility]. We recommend the following:  Pulmonary rehab referral placed.  Can try switching Claritin to Zyrtec once daily. Start Nasonex once daily every day.  Keep using smoking cessation medications as prescribed.   Meds ordered this encounter  Medications  . Tiotropium Bromide Monohydrate (SPIRIVA RESPIMAT) 2.5 MCG/ACT AERS    Sig: Inhale 2 puffs into the lungs daily.    Dispense:  12 g    Refill:  3  . azelastine (ASTELIN) 0.1 % nasal spray    Sig: Place 2 sprays into both nostrils 2 (two) times daily. Use in each nostril as directed    Dispense:  30 mL    Refill:  11    Return in about 3 months (around 04/19/2020).    Please do your part to reduce the spread of COVID-19.  It is very important that you stop smoking or vaping. This is the single most important thing that you can do to improve your lung health.   S = Set a quit date. T = Tell family, friends, and the people around you that you plan to quit. A = Anticipate or plan ahead for the tough times you'll face while quitting. R = Remove cigarettes and other tobacco products from your home, car, and work. T = Talk to [name] about getting help to quit.  If you need help, please reach out to our office or the smoking cessation resources available: [place] Smoking Cessation Class: IE:5250201 1-800-QUIT-NOW www.BeTobaccoFree.gov

## 2020-01-19 NOTE — Progress Notes (Signed)
Thanks for seeing her! I agree with your recommendations.  Julian Hy, DO 01/19/20 7:57 PM [facility]

## 2020-01-19 NOTE — Telephone Encounter (Signed)
Received referral notification from Dr. Carlis Abbott at [facility] for this pt to participate in pulmonary rehab.  Noted that pt lives in [place]. Called and left message for pt to please contact.  Would like to determine if pt would like to come to [place] location or [place] for her pulmonary rehab needs.  contact information provided. Cherre Huger, BSN Cardiac and Training and development officer

## 2020-01-21 ENCOUNTER — Telehealth: Payer: Self-pay | Admitting: Critical Care Medicine

## 2020-01-21 NOTE — Telephone Encounter (Signed)
Spoke with Peter Congo, she states she let Forestine Na know , she also forward the referral to [facility].  Peter Congo states someone from [facility] will call patient to get set up.  Nothing further needed at this time.

## 2020-01-21 NOTE — Telephone Encounter (Signed)
Rec'd completed paperwork back - fwd to [employer] via interoffice mail -pr  °

## 2020-01-21 NOTE — Telephone Encounter (Signed)
LMTC x 1  

## 2020-01-25 NOTE — Telephone Encounter (Signed)
Dr. Carlis Abbott please advise on patient's mychart message:  Hi Dr. Carlis Abbott. I went to work today and only made it 1 hour and 30 minutes . I got so winded and couldn't catch my breath. I had to sit down a couple of time because my legs started trembling. I've been using my inhalers and the emergency  inhaler and it still feels like a hand squeezing  me right between my breast.I don't know what else to do. Its like if I do anything I can't breath.

## 2020-01-25 NOTE — Progress Notes (Signed)
Would you want me to send in nystatin or diflucan for this?? You saw her recently and she had a cough with some sputum production I believe

## 2020-01-26 ENCOUNTER — Other Ambulatory Visit (HOSPITAL_COMMUNITY): Payer: 59

## 2020-01-26 ENCOUNTER — Ambulatory Visit (INDEPENDENT_AMBULATORY_CARE_PROVIDER_SITE_OTHER): Payer: 59

## 2020-01-26 ENCOUNTER — Other Ambulatory Visit: Payer: Self-pay

## 2020-01-26 DIAGNOSIS — I739 Peripheral vascular disease, unspecified: Secondary | ICD-10-CM | POA: Diagnosis not present

## 2020-01-26 NOTE — Telephone Encounter (Signed)
Dr.  Clark - please advise. Thanks. 

## 2020-01-26 NOTE — Progress Notes (Signed)
No, probably contaminant. Thanks for checking.

## 2020-01-27 NOTE — Telephone Encounter (Signed)
Called and spoke with pt in regards to her mychart message and to see if she felt like she needed to be seen sooner for an appt. Pt wanted to hear from Dr. Carlis Abbott what she recommended if she thought that pt needed to be seen sooner based off of her mychart message she sent.  Pt stated that she is currently in the process trying to get disability but she stated that she wanted to know if a letter could be done for her job in regards to her breathing as she said no matter what she does, she has issues with her breathing.  Dr. Carlis Abbott, please advise if you feel like pt should have a sooner visit to further discuss or what you recommend.

## 2020-01-28 ENCOUNTER — Encounter: Payer: Self-pay | Admitting: Critical Care Medicine

## 2020-01-29 ENCOUNTER — Telehealth: Payer: Self-pay | Admitting: *Deleted

## 2020-01-29 NOTE — Telephone Encounter (Signed)
Jillian [name], [place]  [id number] [id number] AM EDT    Left message to return call.

## 2020-01-29 NOTE — Telephone Encounter (Signed)
-----   Message from Herminio Commons, MD sent at 01/28/2020  1:20 PM EDT ----- No significant blockages

## 2020-01-29 NOTE — Telephone Encounter (Signed)
Laurine Blazer, [place]  [id number] [id number] AM EDT    Patient notified. Copy to pmd. Follow up scheduled for May.

## 2020-02-12 ENCOUNTER — Telehealth: Payer: Self-pay | Admitting: Pharmacist

## 2020-02-12 NOTE — Telephone Encounter (Signed)
Called patient on 02/12/2020 at 3:14 PM and left HIPAA-compliant VM with instructions to call [facility] back   Plan to discuss smoking cessation. 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 Will await returned phone call at this time.  Thank you for involving pharmacy to assist in providing this patient's care.   Drexel Iha, PharmD PGY2 Ambulatory Care Pharmacy Resident

## 2020-02-15 LAB — RESPIRATORY CULTURE OR RESPIRATORY AND SPUTUM CULTURE
MICRO NUMBER:: 10332191
RESULT:: NORMAL
SPECIMEN QUALITY:: ADEQUATE

## 2020-02-15 LAB — FUNGUS CULTURE W SMEAR
MICRO NUMBER:: 10332190
SMEAR:: NONE SEEN
SPECIMEN QUALITY:: ADEQUATE

## 2020-02-16 NOTE — Progress Notes (Signed)
Please let Jillian [name] know that although this grew in her sputum it is not likely causing her symptoms. This is often a colonizer and does not usually cause infections, and this grew in only a small amount.

## 2020-02-29 ENCOUNTER — Encounter: Payer: Self-pay | Admitting: Cardiovascular Disease

## 2020-02-29 ENCOUNTER — Other Ambulatory Visit: Payer: Self-pay

## 2020-02-29 ENCOUNTER — Ambulatory Visit (INDEPENDENT_AMBULATORY_CARE_PROVIDER_SITE_OTHER): Payer: 59 | Admitting: Cardiovascular Disease

## 2020-02-29 VITALS — BP 122/64 | HR 88 | Ht 62.0 in | Wt 180.0 lb

## 2020-02-29 DIAGNOSIS — R0600 Dyspnea, unspecified: Secondary | ICD-10-CM

## 2020-02-29 DIAGNOSIS — M79604 Pain in right leg: Secondary | ICD-10-CM

## 2020-02-29 DIAGNOSIS — Z720 Tobacco use: Secondary | ICD-10-CM | POA: Diagnosis not present

## 2020-02-29 DIAGNOSIS — R079 Chest pain, unspecified: Secondary | ICD-10-CM

## 2020-02-29 DIAGNOSIS — I10 Essential (primary) hypertension: Secondary | ICD-10-CM

## 2020-02-29 DIAGNOSIS — M79605 Pain in left leg: Secondary | ICD-10-CM

## 2020-02-29 DIAGNOSIS — J439 Emphysema, unspecified: Secondary | ICD-10-CM

## 2020-02-29 DIAGNOSIS — R0609 Other forms of dyspnea: Secondary | ICD-10-CM

## 2020-02-29 NOTE — Progress Notes (Signed)
SUBJECTIVE: The patient returns for follow-up after undergoing cardiovascular testing performed for the evaluation of chest pain and exertional dyspnea.  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  Echocardiogram on 12/31/2019 demonstrated vigorous LV systolic function, LVEF greater than 75%.  ABIs showed no evidence of significant bilateral lower extremity arterial disease.  Since her last visit with me, she saw her pulmonologist who has taken her out of work.  She is applying for disability.  She is more anxious and has a difficult time trying to quit smoking.  She denies any recurrence of chest pain.  She continues to experience exertional dyspnea.  It helps when she leans forward.  She also has chronic lower back pain particular when washing the dishes which also improves when leaning forward.  She denies leg swelling, orthopnea, and paroxysmal nocturnal dyspnea.    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  She smokes a pack and a half of cigarettes daily.  Review of Systems: As per "subjective", otherwise negative.  Allergies  Allergen Reactions  . Doxycycline Photosensitivity    Broke out with blisters after being in the sun  . Banana Hives    Current Outpatient Medications  Medication Sig Dispense Refill  . albuterol (VENTOLIN HFA) 108 (90 Base) MCG/ACT inhaler Inhale 2 puffs into the lungs every 4 (four) hours as needed for wheezing or shortness of breath. 48 g 3  . Aspirin-Acetaminophen-Caffeine (GOODY HEADACHE PO) Take by mouth as needed.    Marland Kitchen azelastine (ASTELIN) 0.1 % nasal spray Place 2 sprays into both nostrils 2 (two) times daily. Use in each nostril as directed 30 mL 11  . buPROPion (WELLBUTRIN XL) 300 MG 24 hr tablet Take 1 tablet (300 mg total) by mouth daily. 90 tablet 3  . DULoxetine (CYMBALTA) 60 MG capsule Take 60 mg by mouth 2 (two) times daily.     Marland Kitchen ELDERBERRY  PO Take 2 capsules by mouth daily.    . fluticasone-salmeterol (ADVAIR HFA) 115-21 MCG/ACT inhaler Inhale 2 puffs into the lungs 2 (two) times daily.    Marland Kitchen loratadine (CLARITIN) 10 MG tablet Take 10 mg by mouth daily.    . mometasone (NASONEX) 50 MCG/ACT nasal spray Place 1 spray into the nose as needed.    . pantoprazole (PROTONIX) 40 MG tablet Take 1 tablet by mouth 2 (two) times daily before a meal.     . Sodium Bicarbonate-Citric Acid (ALKA-SELTZER HEARTBURN PO) Take by mouth daily.    . Tiotropium Bromide Monohydrate (SPIRIVA RESPIMAT) 2.5 MCG/ACT AERS Inhale 2 puffs into the lungs daily. 12 g 3  . valsartan-hydrochlorothiazide (DIOVAN-HCT) 160-12.5 MG tablet Take 1 tablet by mouth daily.    . vitamin B-12 (CYANOCOBALAMIN) 1000 MCG tablet Take 1,000 mcg by mouth daily.    . Vitamin D, Ergocalciferol, (DRISDOL) 1.25 MG (50000 UT) CAPS capsule Take 1 capsule by mouth once a week.     No current facility-administered medications for this visit.    Past Medical History:  Diagnosis Date  . Anxiety   . Arthritis   . Asthma   . Depression   . High blood pressure   . History of bladder problems   . Migraines   . Sleep apnea     Past Surgical History:  Procedure Laterality Date  . APPENDECTOMY    . BLADDER SURGERY     x3  . COLONOSCOPY  06/2018  Dr. Therisa Doyne: Normal terminal ileum with normal biopsies.  Evidence of prior end to side ileocolonic anastomosis in the cecum.  This was patent and was characterized by healthy-appearing mucosa.  Multiple small and large mouth diverticula noted in the sigmoid colon and descending colon.  Random colon biopsies were benign.  Recommended colonoscopy in 10 years.  . ileum resection  1982  . LAPAROSCOPIC CHOLECYSTECTOMY    . PARTIAL HYSTERECTOMY      Social History   Socioeconomic History  . Marital status: Divorced    Spouse name: Not on file  . Number of children: Not on file  . Years of education: Not on file  . Highest education level:  Not on file  Occupational History  . Not on file  Tobacco Use  . Smoking status: Current Every Day Smoker    Packs/day: 1.00    Years: 55.00    Pack years: 55.00    Types: Cigarettes  . Smokeless tobacco: Never Used  Substance and Sexual Activity  . Alcohol use: Yes    Comment: hardly ever  . Drug use: No  . Sexual activity: Not on file  Other Topics Concern  . Not on file  Social History Narrative  . Not on file   Social Determinants of Health   Financial Resource Strain:   . Difficulty of Paying Living Expenses:   Food Insecurity:   . Worried About Charity fundraiser in the Last Year:   . Arboriculturist in the Last Year:   Transportation Needs:   . Film/video editor (Medical):   Marland Kitchen Lack of Transportation (Non-Medical):   Physical Activity:   . Days of Exercise per Week:   . Minutes of Exercise per Session:   Stress:   . Feeling of Stress :   Social Connections:   . Frequency of Communication with Friends and Family:   . Frequency of Social Gatherings with Friends and Family:   . Attends Religious Services:   . Active Member of Clubs or Organizations:   . Attends Archivist Meetings:   Marland Kitchen Marital Status:   Intimate Partner Violence:   . Fear of Current or Ex-Partner:   . Emotionally Abused:   Marland Kitchen Physically Abused:   . Sexually Abused:     Orson Slick, LPN was present throughout the entirety of the encounter.  Vitals:   02/29/20 1430  BP: 122/64  Pulse: 88  SpO2: 98%  Weight: 180 lb (81.6 kg)  Height: 5\' 2"  (1.575 m)    Wt Readings from Last 3 Encounters:  02/29/20 180 lb (81.6 kg)  01/19/20 183 lb (83 kg)  12/23/19 175 lb 12.8 oz (79.7 kg)     PHYSICAL EXAM General: NAD HEENT: Normal. Neck: No JVD, no thyromegaly. Lungs: Clear to auscultation bilaterally with normal respiratory effort. CV: Regular rate and rhythm, normal S1/S2, no S3/S4, no murmur. No pretibial or periankle edema.  No carotid bruit.   Abdomen: Soft, nontender, no  distention.  Neurologic: Alert and oriented.  Psych: Normal affect. Skin: Normal. Musculoskeletal: No gross deformities.      Labs: Lab Results  Component Value Date/Time   K 4.2 12/14/2019 02:10 PM   BUN 13 12/14/2019 02:10 PM   BUN 16 08/22/2017 08:49 AM   CREATININE 0.82 12/14/2019 02:10 PM   ALT 10 12/14/2019 02:10 PM   TSH 1.025 07/10/2019 11:25 AM   TSH 1.440 08/22/2017 08:49 AM   HGB 15.5 (H) 11/25/2019 03:40 PM   HGB  14.9 08/22/2017 08:49 AM     Lipids: No results found for: LDLCALC, LDLDIRECT, CHOL, TRIG, HDL     ASSESSMENT AND PLAN:  1.  Chest pain and exertional dyspnea: No significant obstructive CAD by coronary angiography as noted above.  I suspect symptoms are due to COPD given her long history of tobacco use.  No chest pain recurrence.  She follows with pulmonology.  2.  Hypertension: BP is normal.  No changes.  3.  COPD: Managed by pulmonary.  4.  Bilateral leg pain: No significant bilateral lower extremity arterial disease by ABIs.  5.  Tobacco abuse: She currently smokes 1.5 packs of cigarettes daily.  She wants to quit but is having a difficult time doing so as she quit work and she is more anxious.    Disposition: Follow up as needed   Kate Sable, M.D., F.A.C.C.

## 2020-02-29 NOTE — Patient Instructions (Addendum)
Medication Instructions:  Continue all current medications.  Labwork: none  Testing/Procedures: none  Follow-Up: As needed.    Any Other Special Instructions Will Be Listed Below (If Applicable).  If you need a refill on your cardiac medications before your next appointment, please call your pharmacy.  

## 2020-03-10 ENCOUNTER — Encounter: Payer: Self-pay | Admitting: Adult Health

## 2020-03-10 ENCOUNTER — Other Ambulatory Visit: Payer: Self-pay

## 2020-03-10 ENCOUNTER — Ambulatory Visit (INDEPENDENT_AMBULATORY_CARE_PROVIDER_SITE_OTHER): Payer: 59 | Admitting: Adult Health

## 2020-03-10 DIAGNOSIS — J309 Allergic rhinitis, unspecified: Secondary | ICD-10-CM

## 2020-03-10 DIAGNOSIS — J441 Chronic obstructive pulmonary disease with (acute) exacerbation: Secondary | ICD-10-CM | POA: Diagnosis not present

## 2020-03-10 DIAGNOSIS — Z720 Tobacco use: Secondary | ICD-10-CM | POA: Diagnosis not present

## 2020-03-10 MED ORDER — PREDNISONE 10 MG PO TABS: ORAL_TABLET | ORAL | 0 refills | Status: DC

## 2020-03-10 NOTE — Telephone Encounter (Signed)
Called pt based on  Her mychart message sent.  Pt stated she began having problems with her cough and worsening wheezing 2 days ago. Pt stated when she woke up Tuesday 5/25, she noticed that the air was blowing straight on her. Pt developed a headache and also problems with sinses after that. Pt stated the wheezing became worse and she states her cough at times is a dry cough but then there are some times she is coughing up phlem but is not able to get the phlegm up as she is getting choked on it.  Pt stated that she is not running any fever.  Stated to pt based off of her worsening symptoms that we should schedule her for a visit to further evaluate and she verbalized understanding. Pt has been scheduled for a televisit today at 11am with TP. Nothing further needed.

## 2020-03-10 NOTE — Progress Notes (Signed)
Virtual Visit via Telephone Note  I connected with Jillian [name] on 03/10/20 at 11:00 AM EDT by telephone and verified that I am speaking with the correct person using two identifiers.  Location: Patient: Home  Provider: Home Office    I discussed the limitations, risks, security and privacy concerns of performing an evaluation and management service by telephone and the availability of in person appointments. I also discussed with the patient that there may be a patient responsible charge related to this service. The patient expressed understanding and agreed to proceed.   History of Present Illness: 54 year old female active smoker followed for COPD  Today's televisit is an acute office visit.  Patient complains of 2 days of increased chest congestion, nasal congestion , sinus pressure , drainage, chest tightness, wheezing . Cough is minimally productive, gets up thick mucus on occasion.  No fever, chest pain, orthopnea, hemoptysis , edema.  Taking mucinex with some help.  Remains on Advair and Spiriva . No increased albuterol use.   Covid Vaccine utd.  Still smoking 1 PPD.Trying to cut back .    Observations/Objective: CT chest 09/30/2019-emphysema, upper lobe predominant.  Subpleural RML nodule with linear scar. No new nodules compared to July 2020.  CT coronary 01/08/2020- persistent right middle lobe subpleural nodule, decreased in size compared to previous.  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  Pulmonary Functions Testing Results: PFT Results Latest Ref Rng & Units 01/18/2020  FVC-Pre L 1.58  FVC-Predicted Pre % 49  FVC-Post L 2.04  FVC-Predicted Post % 63  Pre FEV1/FVC % % 67  Post FEV1/FCV % % 70  FEV1-Pre L 1.06  FEV1-Predicted Pre % 41  FEV1-Post L 1.43  DLCO UNC% % 69  DLCO COR %Predicted % 71  TLC L 4.34  TLC % Predicted % 89  RV % Predicted % 144    2021-severe obstruction with  significant bronchodilator reversibility. Air-trapping is present.  Mildly impaired diffusion capacity.  Flow volume loop suggests mixed obstruction and restriction.  Assessment and Plan: Acute COPD exacerbation and active smoker Patient is encouraged on smoking cessation.  We will treat with a short course of steroids.  She is to continue on her maintenance regimen.  Allergic rhinitis-mild flare continue on maintenance regimen. Short steroid taper  Tobacco abuse smoking cessation encouraged  Plan  Patient Instructions  Prednisone taper over the next week Mucinex DM twice daily as needed for cough and congestion Continue on Advair 1 puff twice daily, rinse after use Continue on Spiriva daily Activity as tolerated Continue to work on cutting back on her smoking and quitting. Follow-up with Dr. Carlis Abbott in 6 weeks and as needed Please contact office for sooner follow up if symptoms do not improve or worsen or seek emergency care       Follow Up Instructions: Follow-up in 6 weeks and as needed Please contact office for sooner follow up if symptoms do not improve or worsen or seek emergency care     I discussed the assessment and treatment plan with the patient. The patient was provided an opportunity to ask questions and all were answered. The patient agreed with the plan and demonstrated an understanding of the instructions.   The patient was advised to call back or seek an in-person evaluation if the symptoms worsen or if the condition fails to improve as anticipated.  I provided 22  minutes of non-face-to-face time during this encounter.  Rexene Edison, NP

## 2020-03-10 NOTE — Patient Instructions (Signed)
Prednisone taper over the next week Mucinex DM twice daily as needed for cough and congestion Continue on Advair 1 puff twice daily, rinse after use Continue on Spiriva daily Activity as tolerated Continue to work on cutting back on her smoking and quitting. Follow-up with Dr. Carlis Abbott in 6 weeks and as needed Please contact office for sooner follow up if symptoms do not improve or worsen or seek emergency care

## 2020-03-23 MED ORDER — FLUTICASONE-SALMETEROL 115-21 MCG/ACT IN AERO: 2.0000 | INHALATION_SPRAY | Freq: Two times a day (BID) | RESPIRATORY_TRACT | 1 refills | Status: DC

## 2020-03-31 NOTE — Progress Notes (Signed)
Agree  LPC

## 2020-04-06 NOTE — Telephone Encounter (Signed)
Dr. Clark, please advise. 

## 2020-04-19 ENCOUNTER — Ambulatory Visit (INDEPENDENT_AMBULATORY_CARE_PROVIDER_SITE_OTHER): Payer: 59 | Admitting: Critical Care Medicine

## 2020-04-19 ENCOUNTER — Encounter: Payer: Self-pay | Admitting: Critical Care Medicine

## 2020-04-19 ENCOUNTER — Other Ambulatory Visit: Payer: Self-pay

## 2020-04-19 VITALS — BP 130/70 | HR 106 | Temp 98.2°F | Ht 62.0 in | Wt 185.0 lb

## 2020-04-19 DIAGNOSIS — J309 Allergic rhinitis, unspecified: Secondary | ICD-10-CM

## 2020-04-19 DIAGNOSIS — R0600 Dyspnea, unspecified: Secondary | ICD-10-CM

## 2020-04-19 DIAGNOSIS — J441 Chronic obstructive pulmonary disease with (acute) exacerbation: Secondary | ICD-10-CM

## 2020-04-19 DIAGNOSIS — R0609 Other forms of dyspnea: Secondary | ICD-10-CM

## 2020-04-19 DIAGNOSIS — Z720 Tobacco use: Secondary | ICD-10-CM

## 2020-04-19 MED ORDER — IPRATROPIUM-ALBUTEROL 0.5-2.5 (3) MG/3ML IN SOLN: 3.0000 mL | RESPIRATORY_TRACT | 3 refills | Status: DC | PRN

## 2020-04-19 NOTE — Patient Instructions (Addendum)
Thank you for visiting Dr. Carlis Abbott at [facility]. We recommend the following: Orders Placed This Encounter  Procedures   Ambulatory Referral for DME   Orders Placed This Encounter  Procedures   Ambulatory Referral for DME    Referral Priority:   Routine    Referral Type:   Durable Medical Equipment Purchase    Number of Visits Requested:   1    Try having only 1/2 pack of cigarettes available per day to see if it helps you cut back.  Meds ordered this encounter  Medications   ipratropium-albuterol (DUONEB) 0.5-2.5 (3) MG/3ML SOLN    Sig: Take 3 mLs by nebulization every 4 (four) hours as needed.    Dispense:  360 mL    Refill:  3    Return in about 3 months (around 07/20/2020).    Please do your part to reduce the spread of COVID-19.   It is very important that you stop smoking or vaping. This is the single most important thing that you can do to improve your lung health.   S = Set a quit date. T = Tell family, friends, and the people around you that you plan to quit. A = Anticipate or plan ahead for the tough times you'll face while quitting. R = Remove cigarettes and other tobacco products from your home, car, and work. T = Talk to [name] about getting help to quit.  If you need help, please reach out to our office or the smoking cessation resources available: [place] Smoking Cessation Class: [phone] 1-800-QUIT-NOW www.BeTobaccoFree.gov

## 2020-04-19 NOTE — Progress Notes (Signed)
Synopsis: Referred in February 2021 for COPD, lung cancer screening by Neale Burly, MD.  Subjective:   PATIENT ID: Jillian [name] GENDER: female DOB: 22-Nov-1965, MRN: [id number]  Chief Complaint  Patient presents with  . Follow-up    Patient feels like her breathing is worse than last visit. She is using her Advair and Spiriva. Patient states her PCP prescribed her a nebulizer but she has not got it. Cough dry/productive with yellow/brown sputum. Using rescue inhaler 4 times a day.      Jillian [name] is a 54 y/o woman with a history of severe COPD who presents for follow-up.  She has chronic dyspnea on exertion with shortness of breath with any level of activity.  She has trouble going grocery shopping, washing her hair in the shower, and walking around her house.  She has chronic cough, wheezing, sputum production.  She remains on Spiriva daily and Advair twice daily.  She continues to use Nasonex and azelastine nasal sprays for her allergies.  She is using her albuterol about 4 times per day due to uncontrolled symptoms, and feels that it benefits her when she uses it.  She needs it more often when she is more active.  She had a follow-up this NP [facility] on 03/10/2020 for an acute COPD exacerbation requiring prednisone.  She continues to smoke 1 pack/day cigarettes.  She is working on quitting by keeping herself occupied with coloring and eating licorice.  She lives with her son and daughter-in-law, who both smoke.  She continues to take bupropion daily but does not feel that it is helping.  She is no longer using nicotine patches and lozenges.  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  She is on the waiting list to start pulmonary rehab at [facility].  She was prescribed nebulizers by her PCP, but she would like for [name] to prescribe them.    OV 01/19/20: Jillian [name] is a 54 year old woman with a history of COPD who presents for follow-up.  She was  seen yesterday by her pharmacy team for help with smoking cessation.  She is using nicotine patches and lozenges as needed.  She will be starting those this week.  She was previously tried using patches, but struggled when she had a cigarette because she would pull the patch off and felt that she had given up for the day.  She is working on cutting back on smoking.  She is can be starting low-dose bupropion this week as well.  She continues on Spiriva daily and Advair 2 puffs twice daily.  She is unsure if she is using the Spiriva correctly and getting all the medication out of the [facility].  She continues to use her albuterol 2-4 times per day.  She has been having a lot of sinus drainage, for which she takes Claritin and Nasonex, but she has only been using Nasonex as needed.  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  No significant wheezing or cough.  She has chronic GERD which is improved with Protonix and dietary changes.   OV 12/23/19: Jillian [name] is a 54 y/o woman being seen in follow up for COPD and tobacco abuse. She was seen in February for an acute exacerbation treated with prednisone and antibiotics.  She feels much better and no longer has chest pressure; previously she had chest tightness going around her chest. She continues on Spiriva daily and Advair BID. She does not use albuterol because it does not helps her symptoms and gives her a chest heaviness. She has an ongoing cough but mild sputum production. She has wheezing at night mostly. She denies DOE but can only walk to her mailbox and back before stopping due to dyspnea and she walks slower than her peers.  She is a current every day smoker with a 58-pack-year history. She smokes 1+ppd currently, which is her heaviest. She has tried chantix in the past but stopped due to chest pressure like an "elephant sitting on my  chest". Nicotine inhaler and gum didn't help, and she continued to smoke on her patches. She did not tolerate Wellbutrin well. She has patches and is interested in trying them again. Anxiety is contributing to her difficulty quitting.   Past Medical History:  Diagnosis Date  . Anxiety   . Arthritis   . Asthma   . Depression   . High blood pressure   . History of bladder problems   . Migraines   . Sleep apnea      Family History  Problem Relation Age of Onset  . Diabetes Mother   . COPD Mother   . Heart attack Father   . Diabetes Sister   . Colon cancer Neg Hx   . Celiac disease Neg Hx   . Inflammatory bowel disease Neg Hx      Past Surgical History:  Procedure Laterality Date  . APPENDECTOMY    . BLADDER SURGERY     x3  . COLONOSCOPY  06/2018   Dr. Therisa Doyne: Normal terminal ileum with normal biopsies.  Evidence of prior end to side ileocolonic anastomosis in the cecum.  This was patent and was characterized by healthy-appearing mucosa.  Multiple small and large mouth diverticula noted in the sigmoid colon and descending colon.  Random colon biopsies were benign.  Recommended colonoscopy in 10 years.  . ileum resection  1982  . LAPAROSCOPIC CHOLECYSTECTOMY    . PARTIAL HYSTERECTOMY      Social History   Socioeconomic History  . Marital status: Divorced    Spouse name: Not on file  . Number of children: Not on file  . Years of education: Not on file  . Highest education level: Not on file  Occupational History  . Not on file  Tobacco Use  . Smoking status: Current Every Day Smoker    Packs/day: 1.00    Years: 55.00    Pack years: 55.00    Types: Cigarettes  . Smokeless tobacco: Never Used  Vaping Use  . Vaping Use: Never used  Substance and Sexual Activity  . Alcohol use: Yes    Comment: hardly ever  . Drug use: No  . Sexual activity: Not on file  Other Topics Concern  . Not on file  Social History Narrative  . Not on file   Social Determinants of Health     Financial Resource Strain:   . Difficulty of Paying Living Expenses:   Food Insecurity:   . Worried About Charity fundraiser in the Last Year:   . Arboriculturist in the Last Year:   Transportation Needs:   . Film/video editor (Medical):   Marland Kitchen Lack of Transportation (Non-Medical):   Physical Activity:   . Days of Exercise per Week:   . Minutes  of Exercise per Session:   Stress:   . Feeling of Stress :   Social Connections:   . Frequency of Communication with Friends and Family:   . Frequency of Social Gatherings with Friends and Family:   . Attends Religious Services:   . Active Member of Clubs or Organizations:   . Attends Archivist Meetings:   Marland Kitchen Marital Status:   Intimate Partner Violence:   . Fear of Current or Ex-Partner:   . Emotionally Abused:   Marland Kitchen Physically Abused:   . Sexually Abused:      Allergies  Allergen Reactions  . Doxycycline Photosensitivity    Broke out with blisters after being in the sun  . Banana Hives     Immunization History  Administered Date(s) Administered  . Influenza Inj Mdck Quad Pf 07/10/2019  . Influenza Split 08/22/2018  . Influenza,inj,Quad PF,6+ Mos 07/17/2017  . Pneumococcal Conjugate-13 12/23/2019  . Pneumococcal Polysaccharide-23 10/30/2017    Outpatient Medications Prior to Visit  Medication Sig Dispense Refill  . albuterol (VENTOLIN HFA) 108 (90 Base) MCG/ACT inhaler Inhale 2 puffs into the lungs every 4 (four) hours as needed for wheezing or shortness of breath. 48 g 3  . Aspirin-Acetaminophen-Caffeine (GOODY HEADACHE PO) Take by mouth as needed.    Marland Kitchen azelastine (ASTELIN) 0.1 % nasal spray Place 2 sprays into both nostrils 2 (two) times daily. Use in each nostril as directed 30 mL 11  . buPROPion (WELLBUTRIN XL) 300 MG 24 hr tablet Take 1 tablet (300 mg total) by mouth daily. 90 tablet 3  . DULoxetine (CYMBALTA) 60 MG capsule Take 60 mg by mouth 2 (two) times daily.     Marland Kitchen ELDERBERRY PO Take 2 capsules by  mouth daily.    . fluticasone-salmeterol (ADVAIR HFA) 115-21 MCG/ACT inhaler Inhale 2 puffs into the lungs 2 (two) times daily. 3 Inhaler 1  . loratadine (CLARITIN) 10 MG tablet Take 10 mg by mouth daily.    . mometasone (NASONEX) 50 MCG/ACT nasal spray Place 1 spray into the nose as needed.    . pantoprazole (PROTONIX) 40 MG tablet Take 1 tablet by mouth 2 (two) times daily before a meal.     . Sodium Bicarbonate-Citric Acid (ALKA-SELTZER HEARTBURN PO) Take by mouth daily.    . Tiotropium Bromide Monohydrate (SPIRIVA RESPIMAT) 2.5 MCG/ACT AERS Inhale 2 puffs into the lungs daily. 12 g 3  . valsartan-hydrochlorothiazide (DIOVAN-HCT) 160-12.5 MG tablet Take 1 tablet by mouth daily.    . vitamin B-12 (CYANOCOBALAMIN) 1000 MCG tablet Take 1,000 mcg by mouth daily.    . Vitamin D, Ergocalciferol, (DRISDOL) 1.25 MG (50000 UT) CAPS capsule Take 1 capsule by mouth once a week.    . predniSONE (DELTASONE) 10 MG tablet 4 tabs for 2 days, then 3 tabs for 2 days, 2 tabs for 2 days, then 1 tab for 2 days, then stop 20 tablet 0   No facility-administered medications prior to visit.    Review of Systems  Constitutional: Negative for chills and fever.  Respiratory: Positive for cough, shortness of breath and wheezing.   Cardiovascular: Negative for chest pain.  Gastrointestinal: Negative for heartburn, nausea and vomiting.  Endo/Heme/Allergies: Positive for environmental allergies.     Objective:   Vitals:   04/19/20 1336  BP: 130/70  Pulse: (!) 106  Temp: 98.2 F (36.8 C)  SpO2: 93%  Weight: 185 lb (83.9 kg)  Height: 5\' 2"  (1.575 m)   93% on   RA BMI Readings from  Last 3 Encounters:  04/19/20 33.84 kg/m  02/29/20 32.92 kg/m  01/19/20 33.47 kg/m   Wt Readings from Last 3 Encounters:  04/19/20 185 lb (83.9 kg)  02/29/20 180 lb (81.6 kg)  01/19/20 183 lb (83 kg)    Physical Exam Vitals reviewed.  Constitutional:      General: She is not in acute distress.    Appearance: Normal  appearance. She is obese. She is not ill-appearing.  HENT:     Head: Normocephalic and atraumatic.  Eyes:     General: No scleral icterus. Cardiovascular:     Rate and Rhythm: Normal rate and regular rhythm.     Heart sounds: No murmur heard.   Pulmonary:     Comments: Speaking in full sentences, no conversational dyspnea.  Clear to auscultation bilaterally, prolonged exhalation. Abdominal:     General: There is no distension.     Palpations: Abdomen is soft.     Tenderness: There is no abdominal tenderness.  Musculoskeletal:        General: No swelling or deformity.     Cervical back: Neck supple.  Lymphadenopathy:     Cervical: No cervical adenopathy.  Skin:    General: Skin is warm and dry.     Findings: No rash.  Neurological:     General: No focal deficit present.     Mental Status: She is alert.     Coordination: Coordination normal.  Psychiatric:        Mood and Affect: Mood normal.        Behavior: Behavior normal.      CBC    Component Value Date/Time   WBC 13.0 (H) 11/25/2019 1540   RBC 5.17 (H) 11/25/2019 1540   HGB 15.5 (H) 11/25/2019 1540   HGB 14.9 08/22/2017 0849   HCT 47.4 (H) 11/25/2019 1540   HCT 44.5 08/22/2017 0849   PLT 313.0 11/25/2019 1540   MCV 91.8 11/25/2019 1540   MCV 96 08/22/2017 0849   MCH 31.7 07/10/2019 1125   MCHC 32.6 11/25/2019 1540   RDW 15.5 11/25/2019 1540   RDW 14.7 08/22/2017 0849   LYMPHSABS 2.7 11/25/2019 1540   LYMPHSABS 2.3 08/22/2017 0849   MONOABS 1.1 (H) 11/25/2019 1540   EOSABS 1.2 (H) 11/25/2019 1540   EOSABS 1.0 (H) 08/22/2017 0849   BASOSABS 0.2 (H) 11/25/2019 1540   BASOSABS 0.1 08/22/2017 0849    CHEMISTRY No results for input(s): NA, K, CL, CO2, GLUCOSE, BUN, CREATININE, CALCIUM, MG, PHOS in the last 168 hours. CrCl cannot be calculated (Patient's most recent lab result is older than the maximum 21 days allowed.).   Chest Imaging- films reviewed: CT chest 09/30/2019-emphysema, upper lobe  predominant.  Subpleural RML nodule with linear scar. No new nodules compared to July 2020.  CT coronary 01/08/2020- persistent right middle lobe subpleural nodule, decreased in size compared to previous.  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   Pulmonary Functions Testing Results: PFT Results Latest Ref Rng & Units 01/18/2020  FVC-Pre L 1.58  FVC-Predicted Pre % 49  FVC-Post L 2.04  FVC-Predicted Post % 63  Pre FEV1/FVC % % 67  Post FEV1/FCV % % 70  FEV1-Pre L 1.06  FEV1-Predicted Pre % 41  FEV1-Post L 1.43  DLCO UNC% % 69  DLCO COR %Predicted % 71  TLC L 4.34  TLC % Predicted % 89  RV % Predicted % 144    2021-severe obstruction with  significant bronchodilator reversibility. Air-trapping is present.  Mildly impaired diffusion capacity.  Flow volume loop suggests mixed obstruction and restriction.  Echocardiogram: pending      Assessment & Plan:     ICD-10-CM   1. COPD with acute exacerbation ([place])  J44.1 Ambulatory Referral for DME  2. Allergic rhinitis, unspecified seasonality, unspecified trigger  J30.9 Ambulatory Referral for DME  3. Tobacco abuse  Z72.0   4. DOE (dyspnea on exertion)  R06.00 Ambulatory Referral for DME    COPD, possibly with asthma overlap. 2 recent exacerbations. -Continue Advair twice daily.  Continue Spiriva once daily. -Continue albuterol 4 times daily as needed -Duo-nebs twice daily as needed.  These should be used instead of rather than with her rescue albuterol. -Spent the majority of the visit discussing the importance of cutting back significantly on her smoking or ideally quitting altogether.  She understands that her COPD will continue to progress if she continues to smoke. -Continue albuterol as needed -Up-to-date on pneumonia and flu vaccines. -Recommend Covid vaccine.  Allergic rhinitis -Continue Nasonex, Zyrtec  Tobacco abuse -Continue Wellbutrin  daily -Recommend continually trying nicotine replacement therapy -Discussed the importance of cutting back or quitting smoking altogether.  I recommended trying to limit herself to 10 cigarettes/day.  She does best when she has something to occupy her hands and her time to keep her from smoking.  I encouraged her to continue to find activities that will help her do this.    Pulmonary nodule, high risk tobacco history -Follow-up chest CT in 12 to 18 months (December 2021 to June 2022)--previously ordered  > 30 minutes spent on this visit, including time spent face-to-face with the patient, counseling on smoking cessation, and documentation.   Current Outpatient Medications:  .  albuterol (VENTOLIN HFA) 108 (90 Base) MCG/ACT inhaler, Inhale 2 puffs into the lungs every 4 (four) hours as needed for wheezing or shortness of breath., Disp: 48 g, Rfl: 3 .  Aspirin-Acetaminophen-Caffeine (GOODY HEADACHE PO), Take by mouth as needed., Disp: , Rfl:  .  azelastine (ASTELIN) 0.1 % nasal spray, Place 2 sprays into both nostrils 2 (two) times daily. Use in each nostril as directed, Disp: 30 mL, Rfl: 11 .  buPROPion (WELLBUTRIN XL) 300 MG 24 hr tablet, Take 1 tablet (300 mg total) by mouth daily., Disp: 90 tablet, Rfl: 3 .  DULoxetine (CYMBALTA) 60 MG capsule, Take 60 mg by mouth 2 (two) times daily. , Disp: , Rfl:  .  ELDERBERRY PO, Take 2 capsules by mouth daily., Disp: , Rfl:  .  fluticasone-salmeterol (ADVAIR HFA) 115-21 MCG/ACT inhaler, Inhale 2 puffs into the lungs 2 (two) times daily., Disp: 3 Inhaler, Rfl: 1 .  loratadine (CLARITIN) 10 MG tablet, Take 10 mg by mouth daily., Disp: , Rfl:  .  mometasone (NASONEX) 50 MCG/ACT nasal spray, Place 1 spray into the nose as needed., Disp: , Rfl:  .  pantoprazole (PROTONIX) 40 MG tablet, Take 1 tablet by mouth 2 (two) times daily before a meal. , Disp: , Rfl:  .  Sodium Bicarbonate-Citric Acid (ALKA-SELTZER HEARTBURN PO), Take by mouth daily., Disp: , Rfl:  .   Tiotropium Bromide Monohydrate (SPIRIVA RESPIMAT) 2.5 MCG/ACT AERS, Inhale 2 puffs into the lungs daily., Disp: 12 g, Rfl: 3 .  valsartan-hydrochlorothiazide (DIOVAN-HCT) 160-12.5 MG tablet, Take 1 tablet by mouth daily., Disp: , Rfl:  .  vitamin B-12 (CYANOCOBALAMIN) 1000 MCG tablet, Take 1,000 mcg by mouth daily., Disp: , Rfl:  .  Vitamin D, Ergocalciferol, (DRISDOL)  1.25 MG (50000 UT) CAPS capsule, Take 1 capsule by mouth once a week., Disp: , Rfl:  .  ipratropium-albuterol (DUONEB) 0.5-2.5 (3) MG/3ML SOLN, Take 3 mLs by nebulization every 4 (four) hours as needed., Disp: 360 mL, Rfl: 3    Julian Hy, DO [facility] 04/19/2020 2:00 PM

## 2020-05-04 ENCOUNTER — Ambulatory Visit: Payer: 59 | Admitting: Family Medicine

## 2020-05-04 NOTE — Telephone Encounter (Signed)
appt scheduled

## 2020-05-04 NOTE — Telephone Encounter (Signed)
Patient is c/o chest congestion and coughing up phlegm yellow and brown in color. Some head congestion also wheezing as well. Denies chills/or aches. No n/v/d.   Has tried her Duo nebs also her albuterol inhaler. Mucinex and Robitussin.  She feels nothing is help at this time.  Beth please advise.

## 2020-05-04 NOTE — Telephone Encounter (Signed)
Called pt to get more information on her symptoms- LMTCB x 1

## 2020-05-04 NOTE — Telephone Encounter (Signed)
Please scheduled televisit tomorrow with Tammy

## 2020-05-05 ENCOUNTER — Encounter: Payer: Self-pay | Admitting: Adult Health

## 2020-05-05 ENCOUNTER — Ambulatory Visit (INDEPENDENT_AMBULATORY_CARE_PROVIDER_SITE_OTHER): Payer: 59 | Admitting: Adult Health

## 2020-05-05 ENCOUNTER — Other Ambulatory Visit: Payer: Self-pay

## 2020-05-05 DIAGNOSIS — Z720 Tobacco use: Secondary | ICD-10-CM | POA: Diagnosis not present

## 2020-05-05 DIAGNOSIS — J42 Unspecified chronic bronchitis: Secondary | ICD-10-CM | POA: Diagnosis not present

## 2020-05-05 NOTE — Progress Notes (Signed)
Virtual Visit via Telephone Note  I connected with Jillian [name] on 05/05/20 at  4:30 PM EDT by telephone and verified that I am speaking with the correct person using two identifiers.  Location: Patient: Home  Provider: Office    I discussed the limitations, risks, security and privacy concerns of performing an evaluation and management service by telephone and the availability of in person appointments. I also discussed with the patient that there may be a patient responsible charge related to this service. The patient expressed understanding and agreed to proceed.   History of Present Illness: 54 year old female active smoker followed for COPD  Today's televisit is an acute office visit.  Patient complains that over the last 2 days she had noticed increased cough and congestion.  She says she did start Mucinex yesterday and feels that it has helped substantially.  Her cough has resolved today and her congestion seems to be much better.  She denies any fever, discolored mucus hemoptysis chest pain orthopnea.  She remains on Advair and Spiriva.  She is had no increased albuterol use.  She continues to smoke 1 pack of cigarettes a day.  She is trying to cut back slowly.  Her Covid vaccine is up-to-date.  Past Medical History:  Diagnosis Date  . Anxiety   . Arthritis   . Asthma   . Depression   . High blood pressure   . History of bladder problems   . Migraines   . Sleep apnea     Current Outpatient Medications on File Prior to Visit  Medication Sig Dispense Refill  . albuterol (VENTOLIN HFA) 108 (90 Base) MCG/ACT inhaler Inhale 2 puffs into the lungs every 4 (four) hours as needed for wheezing or shortness of breath. 48 g 3  . Aspirin-Acetaminophen-Caffeine (GOODY HEADACHE PO) Take by mouth as needed.    Marland Kitchen azelastine (ASTELIN) 0.1 % nasal spray Place 2 sprays into both nostrils 2 (two) times daily. Use in each nostril as directed 30 mL 11  . buPROPion (WELLBUTRIN XL) 300 MG 24 hr  tablet Take 1 tablet (300 mg total) by mouth daily. 90 tablet 3  . DULoxetine (CYMBALTA) 60 MG capsule Take 60 mg by mouth 2 (two) times daily.     Marland Kitchen ELDERBERRY PO Take 2 capsules by mouth daily.    . fluticasone-salmeterol (ADVAIR HFA) 115-21 MCG/ACT inhaler Inhale 2 puffs into the lungs 2 (two) times daily. 3 Inhaler 1  . ipratropium-albuterol (DUONEB) 0.5-2.5 (3) MG/3ML SOLN Take 3 mLs by nebulization every 4 (four) hours as needed. 360 mL 3  . loratadine (CLARITIN) 10 MG tablet Take 10 mg by mouth daily.    . mometasone (NASONEX) 50 MCG/ACT nasal spray Place 1 spray into the nose as needed.    . pantoprazole (PROTONIX) 40 MG tablet Take 1 tablet by mouth 2 (two) times daily before a meal.     . Sodium Bicarbonate-Citric Acid (ALKA-SELTZER HEARTBURN PO) Take by mouth daily.    . Tiotropium Bromide Monohydrate (SPIRIVA RESPIMAT) 2.5 MCG/ACT AERS Inhale 2 puffs into the lungs daily. 12 g 3  . valsartan-hydrochlorothiazide (DIOVAN-HCT) 160-12.5 MG tablet Take 1 tablet by mouth daily.    . vitamin B-12 (CYANOCOBALAMIN) 1000 MCG tablet Take 1,000 mcg by mouth daily.    . Vitamin D, Ergocalciferol, (DRISDOL) 1.25 MG (50000 UT) CAPS capsule Take 1 capsule by mouth once a week.     No current facility-administered medications on file prior to visit.      Observations/Objective:  Speaks in full sentences with no audible distress or wheezing CT chest 09/30/2019-emphysema, upper lobe predominant. Subpleural RML nodule with linear scar. Nonew nodules compared to July 2020.  CT coronary 01/08/2020-persistent right middle lobe subpleural nodule, decreased in size compared to previous. 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  Pulmonary Functions Testing Results: PFT Results Latest Ref Rng & Units 01/18/2020  FVC-Pre L 1.58  FVC-Predicted Pre % 49  FVC-Post L 2.04  FVC-Predicted Post % 63  Pre FEV1/FVC % % 67  Post FEV1/FCV %  % 70  FEV1-Pre L 1.06  FEV1-Predicted Pre % 41  FEV1-Post L 1.43  DLCO UNC% % 69  DLCO COR %Predicted % 71  TLC L 4.34  TLC % Predicted % 89  RV % Predicted % 144    2021-severe obstruction with significant bronchodilator reversibility. Air-trapping is present. Mildly impaired diffusion capacity. Flow volume loop suggests mixed obstruction and restriction.   Assessment and Plan: COPD prone to frequent exacerbations with ongoing smoking.  Smoking cessation discussed.  Patient appears to be improved after beginning pulmonary hygiene regimen with Mucinex.  Plan  Patient Instructions  Mucinex DM twice daily as needed for cough and congestion Continue on Advair 1 puff twice daily, rinse after use Continue on Spiriva daily Activity as tolerated Continue to work on cutting back on her smoking and quitting. Follow-up with Dr. Carlis Abbott in 3 months and As needed    Please contact office for sooner follow up if symptoms do not improve or worsen or seek emergency care        Follow Up Instructions:    I discussed the assessment and treatment plan with the patient. The patient was provided an opportunity to ask questions and all were answered. The patient agreed with the plan and demonstrated an understanding of the instructions.   The patient was advised to call back or seek an in-person evaluation if the symptoms worsen or if the condition fails to improve as anticipated.  I provided 22 minutes of non-face-to-face time during this encounter.   Rexene Edison, NP

## 2020-05-05 NOTE — Patient Instructions (Addendum)
Mucinex DM twice daily as needed for cough and congestion Continue on Advair 1 puff twice daily, rinse after use Continue on Spiriva daily Activity as tolerated Continue to work on cutting back on her smoking and quitting. Follow-up with Dr. Carlis Abbott in 3 months and As needed    Please contact office for sooner follow up if symptoms do not improve or worsen or seek emergency care

## 2020-06-14 ENCOUNTER — Other Ambulatory Visit: Payer: Self-pay | Admitting: Gastroenterology

## 2020-06-21 ENCOUNTER — Telehealth: Payer: Self-pay | Admitting: Critical Care Medicine

## 2020-06-21 MED ORDER — PREDNISONE 10 MG PO TABS: ORAL_TABLET | ORAL | 0 refills | Status: DC

## 2020-06-21 MED ORDER — AZITHROMYCIN 250 MG PO TABS: ORAL_TABLET | ORAL | 0 refills | Status: DC

## 2020-06-21 NOTE — Telephone Encounter (Signed)
Tried calling the pt to ask more about her symptoms and there was no answer- LMTCB

## 2020-06-21 NOTE — Telephone Encounter (Signed)
Called and spoke with pt letting her know the info stated by TP that she does need to be tested for covid and she verbalized understanding. Also stated to pt that we were going to send in zpak abx and pred taper to pharmacy for her. Verified preferred pharmacy and sent both meds in for pt. Nothing further needed.

## 2020-06-21 NOTE — Telephone Encounter (Signed)
Needs Covid testing , call back if positive .   Seen last 05/05/20 -prone to flares -on Advair /Spiriva   Zpack take as directed  Prednisone taper over next week (Prednisone 10mg  4 tabs for 2 days, then 3 tabs for 2 days, 2 tabs for 2 days, then 1 tab for 2 days, then stop) no refills  Mucinex DM Twice daily  As needed  cough /congestion  Fluids and rest Please contact office for sooner follow up if symptoms do not improve or worsen or seek emergency care

## 2020-06-21 NOTE — Telephone Encounter (Signed)
Primary Pulmonologist: [place] Last office visit and with whom: 05/05/20 Jillian [name] What do we see them for (pulmonary problems): Chronic Bronchitis  Last OV assessment/plan: Mucinex DM twice daily as needed for cough and congestion Continue on Advair 1 puff twice daily, rinse after use Continue on Spiriva daily Activity as tolerated Continue to work on cutting back on her smoking and quitting. Follow-up with Dr. Carlis Abbott in 3 months and As needed     Was appointment offered to patient (explain)?    Reason for call: Spoke with patient regarding prior message. Patient stated symptoms started Friday.  chest is real tight and hard to breathe. Wheezing  and coughing up greenish brown stuff.  Having sinus problems now also. Watery, itchy eyes, sneezing and running nose no fever.Pateint stated she is fully vaccinated.Jillian [name] can you please advise. Thank you.   Allergies  Allergen Reactions  . Doxycycline Photosensitivity    Broke out with blisters after being in the sun  . Banana Hives    Immunization History  Administered Date(s) Administered  . Influenza Inj Mdck Quad Pf 07/10/2019  . Influenza Split 08/22/2018  . Influenza,inj,Quad PF,6+ Mos 07/17/2017  . Pneumococcal Conjugate-13 12/23/2019  . Pneumococcal Polysaccharide-23 10/30/2017

## 2020-06-21 NOTE — Telephone Encounter (Signed)
Forwarding to TP as she saw her most recently in July

## 2020-07-20 NOTE — Telephone Encounter (Signed)
Patient wanted to make Dr. Carlis Abbott aware that she is currently admitted at [facility]. On 4L.   Dr. Carlis Abbott, please advise. Thanks

## 2020-07-25 ENCOUNTER — Telehealth (HOSPITAL_COMMUNITY): Payer: Self-pay | Admitting: *Deleted

## 2020-07-25 ENCOUNTER — Encounter: Payer: Self-pay | Admitting: Critical Care Medicine

## 2020-07-25 ENCOUNTER — Other Ambulatory Visit: Payer: Self-pay

## 2020-07-25 ENCOUNTER — Ambulatory Visit (INDEPENDENT_AMBULATORY_CARE_PROVIDER_SITE_OTHER): Payer: 59 | Admitting: Critical Care Medicine

## 2020-07-25 VITALS — BP 132/68 | HR 103 | Temp 97.9°F | Ht 62.0 in | Wt 180.0 lb

## 2020-07-25 DIAGNOSIS — R0600 Dyspnea, unspecified: Secondary | ICD-10-CM

## 2020-07-25 DIAGNOSIS — Z720 Tobacco use: Secondary | ICD-10-CM | POA: Diagnosis not present

## 2020-07-25 DIAGNOSIS — R0609 Other forms of dyspnea: Secondary | ICD-10-CM

## 2020-07-25 DIAGNOSIS — J449 Chronic obstructive pulmonary disease, unspecified: Secondary | ICD-10-CM

## 2020-07-25 NOTE — Telephone Encounter (Signed)
Received second referral for this pt to participate in pulmonary rehab.  Pt referred back in April.  Pt lives in [address] and at the time of the first referral she wanted to go to [facility]. Looks like pt did not return call from [facility] staff for scheduling pulmonary rehab therefore the referral was closed in May.   Called and was unable to leave message - mailbox was full.  Would like to determine pt preference in location [facility] or [facility]. Will try again later. Cherre Huger, BSN Cardiac and Training and development officer

## 2020-07-25 NOTE — Progress Notes (Signed)
Synopsis: Referred in February 2021 for COPD, lung cancer screening by No ref. provider found.  Subjective:   PATIENT ID: Jillian [name] GENDER: female DOB: 11/08/1965, MRN: [id number]  Chief Complaint  Patient presents with  . Follow-up    Jillian [name] is a 54 year old woman who presents for follow-up of COPD.  She was recently admitted at [facility] for an acute COPD exacerbation from 10/5-10/8.  She was discharged on azithromycin and prednisone.  She has 2 more days of steroids.  She is feeling significantly improved.  She has been using her nebulizer twice a day since coming home.  Unfortunately she resumed smoking when she got home, but is down to 5 cigarettes/day, down from 10/day before she went to the hospital.  Although she was discharged on 3 L nasal cannula with activity she is not using oxygen today in the hospital.  She had trouble getting her oxygen tank to work correctly.  She is up-to-date on her moderna covid vaccine and has not yet had her flu shot.  She had an exacerbation of COPD in September which was treated with azithromycin Z-Pak as well.  She is starting pulmonary rehab next week.  She was turned down for disability and is requesting a letter from our office to appeal this decision, per her lawyer's recommendation.  Reviewed [facility]'s discharge summary in care everywhere.   OV 04/19/20: Jillian [name] is a 54 y/o woman with a history of severe COPD who presents for follow-up.  She has chronic dyspnea on exertion with shortness of breath with any level of activity.  She has trouble going grocery shopping, washing her hair in the shower, and walking around her house.  She has chronic cough, wheezing, sputum production.  She remains on Spiriva daily and Advair twice daily.  She continues to use Nasonex and azelastine nasal sprays for her allergies.  She is using her albuterol about 4 times per day due to uncontrolled symptoms, and feels that it benefits her when she uses it.   She needs it more often when she is more active.  She had a follow-up this NP [facility] on 03/10/2020 for an acute COPD exacerbation requiring prednisone.  She continues to smoke 1 pack/day cigarettes.  She is working on quitting by keeping herself occupied with coloring and eating licorice.  She lives with her son and daughter-in-law, who both smoke.  She continues to take bupropion daily but does not feel that it is helping.  She is no longer using nicotine patches and lozenges.  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  She is on the waiting list to start pulmonary rehab at [facility].  She was prescribed nebulizers by her PCP, but she would like for [name] to prescribe them.   OV 01/19/20: Jillian [name] is a 54 year old woman with a history of COPD who presents for follow-up.  She was seen yesterday by her pharmacy team for help with smoking cessation.  She is using nicotine patches and lozenges as needed.  She will be starting those this week.  She was previously tried using patches, but struggled when she had a cigarette because she would pull the patch off and felt that she had given up for the day.  She is working on cutting back on smoking.  She is can be starting low-dose bupropion this week as well.  She continues on Spiriva daily and Advair 2 puffs twice daily.  She is unsure if she is using the Spiriva correctly and getting all the medication out of the [facility].  She continues to use her albuterol 2-4 times per day.  She has been having a lot of sinus drainage, for which she takes Claritin and Nasonex, but she has only been using Nasonex as needed.  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  No significant wheezing or cough.  She has chronic GERD which is improved with Protonix and dietary changes.   OV 12/23/19: Jillian [name] is a 54 y/o  woman being seen in follow up for COPD and tobacco abuse. She was seen in February for an acute exacerbation treated with prednisone and antibiotics. She feels much better and no longer has chest pressure; previously she had chest tightness going around her chest. She continues on Spiriva daily and Advair BID. She does not use albuterol because it does not helps her symptoms and gives her a chest heaviness. She has an ongoing cough but mild sputum production. She has wheezing at night mostly. She denies DOE but can only walk to her mailbox and back before stopping due to dyspnea and she walks slower than her peers.  She is a current every day smoker with a 58-pack-year history. She smokes 1+ppd currently, which is her heaviest. She has tried chantix in the past but stopped due to chest pressure like an "elephant sitting on my chest". Nicotine inhaler and gum didn't help, and she continued to smoke on her patches. She did not tolerate Wellbutrin well. She has patches and is interested in trying them again. Anxiety is contributing to her difficulty quitting.   Past Medical History:  Diagnosis Date  . Anxiety   . Arthritis   . Asthma   . Depression   . High blood pressure   . History of bladder problems   . Migraines   . Sleep apnea      Family History  Problem Relation Age of Onset  . Diabetes Mother   . COPD Mother   . Heart attack Father   . Diabetes Sister   . Colon cancer Neg Hx   . Celiac disease Neg Hx   . Inflammatory bowel disease Neg Hx      Past Surgical History:  Procedure Laterality Date  . APPENDECTOMY    . BLADDER SURGERY     x3  . COLONOSCOPY  06/2018   Dr. Therisa Doyne: Normal terminal ileum with normal biopsies.  Evidence of prior end to side ileocolonic anastomosis in the cecum.  This was patent and was characterized by healthy-appearing mucosa.  Multiple small and large mouth diverticula noted in the sigmoid colon and descending colon.  Random colon biopsies were benign.   Recommended colonoscopy in 10 years.  . ileum resection  1982  . LAPAROSCOPIC CHOLECYSTECTOMY    . PARTIAL HYSTERECTOMY      Social History   Socioeconomic History  . Marital status: Divorced    Spouse name: Not on file  . Number of children: Not on file  . Years of education: Not on file  . Highest education level: Not on file  Occupational History  . Not on file  Tobacco Use  . Smoking status: Current Every Day Smoker    Packs/day: 0.25    Years: 55.00    Pack years: 13.75  Types: Cigarettes  . Smokeless tobacco: Never Used  . Tobacco comment: 5-6 a day  Vaping Use  . Vaping Use: Never used  Substance and Sexual Activity  . Alcohol use: Yes    Comment: hardly ever  . Drug use: No  . Sexual activity: Not on file  Other Topics Concern  . Not on file  Social History Narrative  . Not on file   Social Determinants of Health   Financial Resource Strain:   . Difficulty of Paying Living Expenses: Not on file  Food Insecurity:   . Worried About Charity fundraiser in the Last Year: Not on file  . Ran Out of Food in the Last Year: Not on file  Transportation Needs:   . Lack of Transportation (Medical): Not on file  . Lack of Transportation (Non-Medical): Not on file  Physical Activity:   . Days of Exercise per Week: Not on file  . Minutes of Exercise per Session: Not on file  Stress:   . Feeling of Stress : Not on file  Social Connections:   . Frequency of Communication with Friends and Family: Not on file  . Frequency of Social Gatherings with Friends and Family: Not on file  . Attends Religious Services: Not on file  . Active Member of Clubs or Organizations: Not on file  . Attends Archivist Meetings: Not on file  . Marital Status: Not on file  Intimate Partner Violence:   . Fear of Current or Ex-Partner: Not on file  . Emotionally Abused: Not on file  . Physically Abused: Not on file  . Sexually Abused: Not on file     Allergies  Allergen  Reactions  . Doxycycline Photosensitivity    Broke out with blisters after being in the sun  . Banana Hives     Immunization History  Administered Date(s) Administered  . Influenza Inj Mdck Quad Pf 07/10/2019  . Influenza Split 08/22/2018  . Influenza,inj,Quad PF,6+ Mos 07/17/2017  . Pneumococcal Conjugate-13 12/23/2019  . Pneumococcal Polysaccharide-23 10/30/2017    Outpatient Medications Prior to Visit  Medication Sig Dispense Refill  . albuterol (VENTOLIN HFA) 108 (90 Base) MCG/ACT inhaler Inhale 2 puffs into the lungs every 4 (four) hours as needed for wheezing or shortness of breath. 48 g 3  . Aspirin-Acetaminophen-Caffeine (GOODY HEADACHE PO) Take by mouth as needed.    Marland Kitchen azelastine (ASTELIN) 0.1 % nasal spray Place 2 sprays into both nostrils 2 (two) times daily. Use in each nostril as directed 30 mL 11  . azithromycin (ZITHROMAX) 250 MG tablet Take two today and then one daily until finished. 6 tablet 0  . DULoxetine (CYMBALTA) 60 MG capsule Take 60 mg by mouth 2 (two) times daily.     Marland Kitchen ELDERBERRY PO Take 2 capsules by mouth daily.    . fluticasone-salmeterol (ADVAIR HFA) 115-21 MCG/ACT inhaler Inhale 2 puffs into the lungs 2 (two) times daily. 3 Inhaler 1  . ipratropium-albuterol (DUONEB) 0.5-2.5 (3) MG/3ML SOLN Take 3 mLs by nebulization every 4 (four) hours as needed. 360 mL 3  . loratadine (CLARITIN) 10 MG tablet Take 10 mg by mouth daily.    Marland Kitchen lubiprostone (AMITIZA) 24 MCG capsule TAKE ONE CAPSULE ONCE TO TWICE DAILY WITH FOOD FOR CONSTIPATION 180 capsule 1  . mometasone (NASONEX) 50 MCG/ACT nasal spray Place 1 spray into the nose as needed.    . pantoprazole (PROTONIX) 40 MG tablet Take 1 tablet by mouth 2 (two) times daily before a  meal.     . Sodium Bicarbonate-Citric Acid (ALKA-SELTZER HEARTBURN PO) Take by mouth daily.    . Tiotropium Bromide Monohydrate (SPIRIVA RESPIMAT) 2.5 MCG/ACT AERS Inhale 2 puffs into the lungs daily. 12 g 3  . valsartan-hydrochlorothiazide  (DIOVAN-HCT) 160-12.5 MG tablet Take 1 tablet by mouth daily.    . vitamin B-12 (CYANOCOBALAMIN) 1000 MCG tablet Take 1,000 mcg by mouth daily.    . Vitamin D, Ergocalciferol, (DRISDOL) 1.25 MG (50000 UT) CAPS capsule Take 1 capsule by mouth once a week.    Marland Kitchen buPROPion (WELLBUTRIN XL) 300 MG 24 hr tablet Take 1 tablet (300 mg total) by mouth daily. 90 tablet 3  . predniSONE (DELTASONE) 10 MG tablet Take 4x2days, 3x2days, 2x2days, 1x2days, then stop 20 tablet 0   No facility-administered medications prior to visit.    Review of Systems  Constitutional: Negative for chills and fever.  Respiratory: Positive for cough, shortness of breath and wheezing.   Cardiovascular: Negative for chest pain.  Gastrointestinal: Negative for heartburn, nausea and vomiting.  Endo/Heme/Allergies: Positive for environmental allergies.     Objective:   Vitals:   07/25/20 1125  BP: 132/68  Pulse: (!) 103  Temp: 97.9 F (36.6 C)  TempSrc: Temporal  SpO2: 94%  Weight: 180 lb (81.6 kg)  Height: 5\' 2"  (1.575 m)   94% on   RA BMI Readings from Last 3 Encounters:  07/25/20 32.92 kg/m  04/19/20 33.84 kg/m  02/29/20 32.92 kg/m   Wt Readings from Last 3 Encounters:  07/25/20 180 lb (81.6 kg)  04/19/20 185 lb (83.9 kg)  02/29/20 180 lb (81.6 kg)    Physical Exam Vitals reviewed.  Constitutional:      General: She is not in acute distress.    Appearance: She is obese. She is not ill-appearing.  HENT:     Head: Normocephalic and atraumatic.  Eyes:     General: No scleral icterus. Cardiovascular:     Rate and Rhythm: Normal rate and regular rhythm.  Pulmonary:     Comments: Breathing comfortably on room air, no conversational dyspnea.  Saturating okay at rest on room air. Abdominal:     General: There is no distension.     Palpations: Abdomen is soft.  Musculoskeletal:        General: No swelling or deformity.     Cervical back: Neck supple.  Skin:    General: Skin is warm and dry.      Findings: No rash.  Neurological:     General: No focal deficit present.     Mental Status: She is alert.     Coordination: Coordination normal.  Psychiatric:        Mood and Affect: Mood normal.        Behavior: Behavior normal.      CBC    Component Value Date/Time   WBC 13.0 (H) 11/25/2019 1540   RBC 5.17 (H) 11/25/2019 1540   HGB 15.5 (H) 11/25/2019 1540   HGB 14.9 08/22/2017 0849   HCT 47.4 (H) 11/25/2019 1540   HCT 44.5 08/22/2017 0849   PLT 313.0 11/25/2019 1540   MCV 91.8 11/25/2019 1540   MCV 96 08/22/2017 0849   MCH 31.7 07/10/2019 1125   MCHC 32.6 11/25/2019 1540   RDW 15.5 11/25/2019 1540   RDW 14.7 08/22/2017 0849   LYMPHSABS 2.7 11/25/2019 1540   LYMPHSABS 2.3 08/22/2017 0849   MONOABS 1.1 (H) 11/25/2019 1540   EOSABS 1.2 (H) 11/25/2019 1540   EOSABS 1.0 (H)  08/22/2017 0849   BASOSABS 0.2 (H) 11/25/2019 1540   BASOSABS 0.1 08/22/2017 0849    CHEMISTRY No results for input(s): NA, K, CL, CO2, GLUCOSE, BUN, CREATININE, CALCIUM, MG, PHOS in the last 168 hours. CrCl cannot be calculated (Patient's most recent lab result is older than the maximum 21 days allowed.).   Chest Imaging- films reviewed: CT chest 09/30/2019-emphysema, upper lobe predominant.  Subpleural RML nodule with linear scar. No new nodules compared to July 2020.  CT coronary 01/08/2020- persistent right middle lobe subpleural nodule, decreased in size compared to previous.  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   Pulmonary Functions Testing Results: PFT Results Latest Ref Rng & Units 01/18/2020  FVC-Pre L 1.58  FVC-Predicted Pre % 49  FVC-Post L 2.04  FVC-Predicted Post % 63  Pre FEV1/FVC % % 67  Post FEV1/FCV % % 70  FEV1-Pre L 1.06  FEV1-Predicted Pre % 41  FEV1-Post L 1.43  DLCO uncorrected ml/min/mmHg 13.53  DLCO [facility]% % 69  DLCO corrected ml/min/mmHg 12.78  DLCO COR %Predicted % 65  DLVA Predicted % 71   TLC L 4.34  TLC % Predicted % 89  RV % Predicted % 144    2021-severe obstruction with significant bronchodilator reversibility. Air-trapping is present.  Mildly impaired diffusion capacity.  Flow volume loop suggests mixed obstruction and restriction.  Echocardiogram 12/31/2019: The EF greater than 75% with hyperdynamic function.  Normal LA, RV, RA.  Normal PA pressures.  Trivial TR, otherwise normal valves.      Assessment & Plan:     ICD-10-CM   1. Tobacco abuse  Z72.0 AMB referral to pulmonary rehabilitation  2. Chronic obstructive pulmonary disease, unspecified COPD type ([place])  J44.9 AMB referral to pulmonary rehabilitation  3. DOE (dyspnea on exertion)  R06.00 AMB referral to pulmonary rehabilitation    COPD, possibly with asthma overlap. 2 recent exacerbations in the past 1 month, 1 severe. -Complete steroid course prescribed in the hospital.  Previously completed azithromycin course. -Continue Advair twice daily.  Continue Spiriva once daily. -Continue albuterol or DuoNebs up to 4 times daily as needed. -Again strongly encourage complete smoking cessation.  She related that finding something else to do while she is driving has been a struggle.  We discussed multiple behavioral modifications she can make to replace smoking while driving. -Up-to-date on pneumonia and covid vaccines. -Recommend seasonal flu vaccine next week when she is off prednisone .  Allergic rhinitis -Continue intranasal steroid and Zyrtec  Tobacco abuse-ongoing -Congratulated her on her reduction in cigarettes and strongly encouraged complete cessation  Pulmonary nodule, high risk tobacco history -Follow-up chest CT in 12 to 18 months (December 2021 to June 2022)--previously ordered  RTC in 3 months with Dr. Erin Fulling.   Current Outpatient Medications:  .  albuterol (VENTOLIN HFA) 108 (90 Base) MCG/ACT inhaler, Inhale 2 puffs into the lungs every 4 (four) hours as needed for wheezing or shortness of  breath., Disp: 48 g, Rfl: 3 .  Aspirin-Acetaminophen-Caffeine (GOODY HEADACHE PO), Take by mouth as needed., Disp: , Rfl:  .  azelastine (ASTELIN) 0.1 % nasal spray, Place 2 sprays into both nostrils 2 (two) times daily. Use in each nostril as directed, Disp: 30 mL, Rfl: 11 .  azithromycin (ZITHROMAX) 250 MG tablet, Take two today and then one daily until finished., Disp: 6 tablet, Rfl: 0 .  DULoxetine (CYMBALTA) 60 MG capsule, Take 60 mg by mouth 2 (two) times daily. ,  Disp: , Rfl:  .  ELDERBERRY PO, Take 2 capsules by mouth daily., Disp: , Rfl:  .  fluticasone-salmeterol (ADVAIR HFA) 115-21 MCG/ACT inhaler, Inhale 2 puffs into the lungs 2 (two) times daily., Disp: 3 Inhaler, Rfl: 1 .  ipratropium-albuterol (DUONEB) 0.5-2.5 (3) MG/3ML SOLN, Take 3 mLs by nebulization every 4 (four) hours as needed., Disp: 360 mL, Rfl: 3 .  loratadine (CLARITIN) 10 MG tablet, Take 10 mg by mouth daily., Disp: , Rfl:  .  lubiprostone (AMITIZA) 24 MCG capsule, TAKE ONE CAPSULE ONCE TO TWICE DAILY WITH FOOD FOR CONSTIPATION, Disp: 180 capsule, Rfl: 1 .  mometasone (NASONEX) 50 MCG/ACT nasal spray, Place 1 spray into the nose as needed., Disp: , Rfl:  .  pantoprazole (PROTONIX) 40 MG tablet, Take 1 tablet by mouth 2 (two) times daily before a meal. , Disp: , Rfl:  .  Sodium Bicarbonate-Citric Acid (ALKA-SELTZER HEARTBURN PO), Take by mouth daily., Disp: , Rfl:  .  Tiotropium Bromide Monohydrate (SPIRIVA RESPIMAT) 2.5 MCG/ACT AERS, Inhale 2 puffs into the lungs daily., Disp: 12 g, Rfl: 3 .  valsartan-hydrochlorothiazide (DIOVAN-HCT) 160-12.5 MG tablet, Take 1 tablet by mouth daily., Disp: , Rfl:  .  vitamin B-12 (CYANOCOBALAMIN) 1000 MCG tablet, Take 1,000 mcg by mouth daily., Disp: , Rfl:  .  Vitamin D, Ergocalciferol, (DRISDOL) 1.25 MG (50000 UT) CAPS capsule, Take 1 capsule by mouth once a week., Disp: , Rfl:     Julian Hy, DO [facility] 07/25/2020 11:47 AM

## 2020-07-25 NOTE — Patient Instructions (Addendum)
Thank you for visiting Dr. Carlis Abbott at [facility]. We recommend the following: Orders Placed This Encounter  Procedures  . AMB referral to pulmonary rehabilitation   Orders Placed This Encounter  Procedures  . AMB referral to pulmonary rehabilitation    Referral Priority:   Routine    Referral Type:   Consultation    Number of Visits Requested:   1    Stay on advair 2 times daily and spiriva once daily. We will retest you for oxygen at your next visit.    Return in about 3 months (around 10/25/2020). with Dr. Erin Fulling (30 minutes visit).    Please do your part to reduce the spread of COVID-19.

## 2020-07-26 ENCOUNTER — Encounter (HOSPITAL_COMMUNITY): Payer: Self-pay | Admitting: *Deleted

## 2020-07-26 NOTE — Progress Notes (Signed)
Received referral from Dr. Carlis Abbott for this pt to participate in pulmonary rehab with the the diagnosis of COPD. Pt with FEV1 post pred 56 from 01/2020. Called pt again and did confirm that although she was closer to the [facility] location, she desired to start as quickly as possible.   [facility] we are scheduling for November verses December for [facility].  Note pt was referred back in April but did not respond to messages left and the referral was closed. Pt is okay to drive to [place] from [place] twice a week. Clinical review of pt follow up appt on 10/11 Pulmonary office note.  Pt with Covid Risk Score - 4. Pt appropriate for scheduling for Pulmonary rehab.  Will forward to support staff for scheduling and verification of insurance eligibility/benefits with pt consent. Cherre Huger, BSN Cardiac and Training and development officer

## 2020-07-27 ENCOUNTER — Telehealth (HOSPITAL_COMMUNITY): Payer: Self-pay

## 2020-07-27 NOTE — Telephone Encounter (Signed)
Pt insurance is active and benefits verified through [employer] Co-pay 0, DED $300/$300 met, out of pocket $2,500/$1,059.37 met, co-insurance 15%. no pre-authorization required. Passport, [place]/[employer] 07/27/2020_0 :52pm, REF# [id number]

## 2020-08-02 ENCOUNTER — Other Ambulatory Visit: Payer: Self-pay | Admitting: Critical Care Medicine

## 2020-08-08 ENCOUNTER — Telehealth (HOSPITAL_COMMUNITY): Payer: Self-pay | Admitting: Internal Medicine

## 2020-08-15 ENCOUNTER — Encounter (HOSPITAL_COMMUNITY)
Admission: RE | Admit: 2020-08-15 | Discharge: 2020-08-15 | Disposition: A | Discharge status: Home / Self Care | Payer: 59 | Source: Ambulatory Visit | Attending: Critical Care Medicine | Admitting: Critical Care Medicine

## 2020-08-15 ENCOUNTER — Encounter (HOSPITAL_COMMUNITY): Payer: Self-pay

## 2020-08-15 ENCOUNTER — Other Ambulatory Visit: Payer: Self-pay

## 2020-08-15 VITALS — BP 132/76 | Ht 62.0 in | Wt 183.9 lb

## 2020-08-15 DIAGNOSIS — J449 Chronic obstructive pulmonary disease, unspecified: Secondary | ICD-10-CM | POA: Insufficient documentation

## 2020-08-15 NOTE — Progress Notes (Signed)
Jillian [name] 54 y.o. female Pulmonary Rehab Orientation Note This patient who was referred to Pulmonary rehab by Dr. Carlis Abbott with the diagnosis of COPD, unspecified arrived today in Cardiac and Pulmonary Rehab. She arrived ambulatory with normal gait. She does carry portable oxygen. Patient was unable to tell me her DME for supplemental oxygen supplies.  Per pt, she uses oxygen continuously. Color good, skin warm and dry. Patient is oriented to time and place. Patient's medical history, psychosocial health, and medications reviewed. Psychosocial assessment reveals patient's son and daughter-in-law live with her in [place]. Pt is currently reapplying for disability.  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 Pt hobbies include crafts and cooking. Pt reports her stress level is low.  Pt does not exhibit signs of depression. PHQ2/9 score 0/0. Pt shows fair  coping skills with positive outlook . Will continue to monitor and evaluate progress toward psychosocial goal(s) of continued mental well-being while participating in pulmonary rehab. Physical assessment reveals heart rate is normal, breath sounds clear to auscultation, no wheezes, rales, or rhonchi, diminished. Grip strength equal, strong. Patient reports she does take medications as prescribed. Patient states she follows a Regular diet. The patient reports no specific efforts to gain or lose weight.. Patient's weight will be monitored closely. Demonstration and practice of PLB using pulse oximeter. Patient able to return demonstration satisfactorily. Safety and hand hygiene in the exercise area reviewed with patient. Patient voices understanding of the information reviewed. Department expectations discussed with patient and achievable goals were set. The patient shows enthusiasm about attending the program and we look forward to working with this nice lady. The patient completed a 6 min walk test today, 08/15/2020 and to begin  exercise on Tuesday, 08/23/2020 in the 1215 exercise slot.  [id number]

## 2020-08-15 NOTE — Progress Notes (Signed)
Pulmonary Individual Treatment Plan  Patient Details  Name: Jillian [name] MRN: [id number] Date of Birth: 03-27-1966 Referring Provider:     Pulmonary Rehab Walk Test from 08/15/2020 in [place]  Referring Provider Dr. Carlis Abbott      Initial Encounter Date:    Pulmonary Rehab Walk Test from 08/15/2020 in [place]  Date 08/15/20      Visit Diagnosis: Chronic obstructive pulmonary disease, unspecified COPD type ([place])  Patient's Home Medications on Admission:   Current Outpatient Medications:  .  ADVAIR HFA 115-21 MCG/ACT inhaler, USE 2 PUFFS BY MOUTH TWICE A DAY, Disp: 36 each, Rfl: 1 .  albuterol (VENTOLIN HFA) 108 (90 Base) MCG/ACT inhaler, Inhale 2 puffs into the lungs every 4 (four) hours as needed for wheezing or shortness of breath., Disp: 48 g, Rfl: 3 .  Aspirin-Acetaminophen-Caffeine (GOODY HEADACHE PO), Take by mouth as needed., Disp: , Rfl:  .  DULoxetine (CYMBALTA) 60 MG capsule, Take 60 mg by mouth 2 (two) times daily. , Disp: , Rfl:  .  ELDERBERRY PO, Take 2 capsules by mouth daily., Disp: , Rfl:  .  ipratropium-albuterol (DUONEB) 0.5-2.5 (3) MG/3ML SOLN, Take 3 mLs by nebulization every 4 (four) hours as needed., Disp: 360 mL, Rfl: 3 .  loratadine (CLARITIN) 10 MG tablet, Take 10 mg by mouth daily., Disp: , Rfl:  .  lubiprostone (AMITIZA) 24 MCG capsule, TAKE ONE CAPSULE ONCE TO TWICE DAILY WITH FOOD FOR CONSTIPATION, Disp: 180 capsule, Rfl: 1 .  mometasone (NASONEX) 50 MCG/ACT nasal spray, Place 1 spray into the nose as needed., Disp: , Rfl:  .  pantoprazole (PROTONIX) 40 MG tablet, Take 1 tablet by mouth 2 (two) times daily before a meal. , Disp: , Rfl:  .  Sodium Bicarbonate-Citric Acid (ALKA-SELTZER HEARTBURN PO), Take by mouth daily., Disp: , Rfl:  .  Tiotropium Bromide Monohydrate (SPIRIVA RESPIMAT) 2.5 MCG/ACT AERS, Inhale 2 puffs into the lungs daily., Disp: 12 g, Rfl: 3 .  valsartan-hydrochlorothiazide  (DIOVAN-HCT) 160-12.5 MG tablet, Take 1 tablet by mouth daily., Disp: , Rfl:  .  vitamin B-12 (CYANOCOBALAMIN) 1000 MCG tablet, Take 1,000 mcg by mouth daily., Disp: , Rfl:  .  Vitamin D, Ergocalciferol, (DRISDOL) 1.25 MG (50000 UT) CAPS capsule, Take 1 capsule by mouth once a week., Disp: , Rfl:  .  azelastine (ASTELIN) 0.1 % nasal spray, Place 2 sprays into both nostrils 2 (two) times daily. Use in each nostril as directed (Patient not taking: Reported on 08/15/2020), Disp: 30 mL, Rfl: 11 .  azithromycin (ZITHROMAX) 250 MG tablet, Take two today and then one daily until finished. (Patient not taking: Reported on 08/15/2020), Disp: 6 tablet, Rfl: 0  Past Medical History: Past Medical History:  Diagnosis Date  . Anxiety   . Arthritis   . Asthma   . Depression   . High blood pressure   . History of bladder problems   . Migraines   . Sleep apnea     Tobacco Use: Social History   Tobacco Use  Smoking Status Current Every Day Smoker  . Packs/day: 0.25  . Years: 55.00  . Pack years: 13.75  . Types: Cigarettes  Smokeless Tobacco Never Used  Tobacco Comment   5-6 a day    Labs: Recent Review Flowsheet Data   There is no flowsheet data to display.     Capillary Blood Glucose: No results found for: GLUCAP   Pulmonary Assessment Scores:  Pulmonary Assessment  Scores    Row Name 08/15/20 1016 08/15/20 1254       ADL UCSD   ADL Phase Entry --    SOB Score total 96 --      CAT Score   CAT Score 39 --      mMRC Score   mMRC Score -- 4          UCSD: Self-administered rating of dyspnea associated with activities of daily living (ADLs) 6-point scale (0 = "not at all" to 5 = "maximal or unable to do because of breathlessness")  Scoring Scores range from 0 to 120.  Minimally important difference is 5 units  CAT: CAT can identify the health impairment of COPD patients and is better correlated with disease progression.  CAT has a scoring range of zero to 40. The CAT score  is classified into four groups of low (less than 10), medium (10 - 20), high (21-30) and very high (31-40) based on the impact level of disease on health status. A CAT score over 10 suggests significant symptoms.  A worsening CAT score could be explained by an exacerbation, poor medication adherence, poor inhaler technique, or progression of COPD or comorbid conditions.  CAT MCID is 2 points  mMRC: mMRC (Modified Medical Research Council) Dyspnea Scale is used to assess the degree of baseline functional disability in patients of respiratory disease due to dyspnea. No minimal important difference is established. A decrease in score of 1 point or greater is considered a positive change.   Pulmonary Function Assessment:  Pulmonary Function Assessment - 08/15/20 1006      Breath   Bilateral Breath Sounds Clear;Decreased    Shortness of Breath Panic with Shortness of Breath;Limiting activity;Fear of Shortness of Breath;Yes           Exercise Target Goals: Exercise Program Goal: Individual exercise prescription set using results from initial 6 min walk test and THRR while considering  patient's activity barriers and safety.   Exercise Prescription Goal: Initial exercise prescription builds to 30-45 minutes a day of aerobic activity, 2-3 days per week.  Home exercise guidelines will be given to patient during program as part of exercise prescription that the participant will acknowledge.  Activity Barriers & Risk Stratification:  Activity Barriers & Cardiac Risk Stratification - 08/15/20 1000      Activity Barriers & Cardiac Risk Stratification   Activity Barriers Fibromyalgia;Deconditioning;Muscular Weakness;Shortness of Breath           6 Minute Walk:  6 Minute Walk    Row Name 08/15/20 1251         6 Minute Walk   Phase Initial     Distance 1351 feet     Walk Time 6 minutes     # of Rest Breaks 0     MPH 2.55     METS 3.72     RPE 13     Perceived Dyspnea  3     VO2 Peak  13.04     Symptoms No     Resting HR 86 bpm     Resting BP 132/76     Resting Oxygen Saturation  99 %     Exercise Oxygen Saturation  during 6 min walk 95 %     Max Ex. HR 112 bpm     Max Ex. BP 162/50     2 Minute Post BP 120/50       Interval HR   1 Minute HR 97  2 Minute HR 106     3 Minute HR 105     4 Minute HR 105     5 Minute HR 107     6 Minute HR 112     2 Minute Post HR 92     Interval Heart Rate? Yes       Interval Oxygen   Interval Oxygen? Yes     Baseline Oxygen Saturation % 99 %     1 Minute Oxygen Saturation % 97 %     1 Minute Liters of Oxygen 3 L     2 Minute Oxygen Saturation % 97 %     2 Minute Liters of Oxygen 3 L     3 Minute Oxygen Saturation % 96 %     3 Minute Liters of Oxygen 3 L     4 Minute Oxygen Saturation % 95 %     4 Minute Liters of Oxygen 3 L     5 Minute Oxygen Saturation % 95 %     5 Minute Liters of Oxygen 3 L     6 Minute Oxygen Saturation % 95 %     6 Minute Liters of Oxygen 3 L     2 Minute Post Oxygen Saturation % 98 %     2 Minute Post Liters of Oxygen 3 L            Oxygen Initial Assessment:  Oxygen Initial Assessment - 08/15/20 1244      Home Oxygen   Home Oxygen Device Portable Concentrator;Home Concentrator    Sleep Oxygen Prescription Pulsed    Liters per minute 3    Home Exercise Oxygen Prescription Pulsed    Liters per minute 3    Home Resting Oxygen Prescription Pulsed    Liters per minute 3    Compliance with Home Oxygen Use Yes      Initial 6 min Walk   Oxygen Used Continuous    Liters per minute 3      Program Oxygen Prescription   Program Oxygen Prescription Continuous    Liters per minute 3      Intervention   Short Term Goals To learn and exhibit compliance with exercise, home and travel O2 prescription;To learn and understand importance of monitoring SPO2 with pulse oximeter and demonstrate accurate use of the pulse oximeter.;To learn and understand importance of maintaining oxygen  saturations>88%;To learn and demonstrate proper pursed lip breathing techniques or other breathing techniques.;To learn and demonstrate proper use of respiratory medications    Long  Term Goals Exhibits compliance with exercise, home and travel O2 prescription;Verbalizes importance of monitoring SPO2 with pulse oximeter and return demonstration;Maintenance of O2 saturations>88%;Exhibits proper breathing techniques, such as pursed lip breathing or other method taught during program session;Compliance with respiratory medication;Demonstrates proper use of MDI's           Oxygen Re-Evaluation:   Oxygen Discharge (Final Oxygen Re-Evaluation):   Initial Exercise Prescription:  Initial Exercise Prescription - 08/15/20 1300      Date of Initial Exercise RX and Referring Provider   Date 08/15/20    Referring Provider Dr. Carlis Abbott    Expected Discharge Date 10/20/20      Oxygen   Oxygen Continuous    Liters 3      NuStep   Level 2    Minutes 15      Track   Minutes 15      Prescription Details   Frequency (times per week) 2  Duration Progress to 30 minutes of continuous aerobic without signs/symptoms of physical distress      Intensity   THRR 40-80% of Max Heartrate 66-132    Ratings of Perceived Exertion 11-13    Perceived Dyspnea 0-4      Progression   Progression Continue to progress workloads to maintain intensity without signs/symptoms of physical distress.      Resistance Training   Training Prescription Yes    Weight orange bands    Reps 10-15           Perform Capillary Blood Glucose checks as needed.  Exercise Prescription Changes:   Exercise Comments:   Exercise Goals and Review:  Exercise Goals    Row Name 08/15/20 1244             Exercise Goals   Increase Physical Activity Yes       Intervention Provide advice, education, support and counseling about physical activity/exercise needs.;Develop an individualized exercise prescription for  aerobic and resistive training based on initial evaluation findings, risk stratification, comorbidities and participant's personal goals.       Expected Outcomes Short Term: Attend rehab on a regular basis to increase amount of physical activity.;Long Term: Add in home exercise to make exercise part of routine and to increase amount of physical activity.;Long Term: Exercising regularly at least 3-5 days a week.       Increase Strength and Stamina Yes       Intervention Provide advice, education, support and counseling about physical activity/exercise needs.;Develop an individualized exercise prescription for aerobic and resistive training based on initial evaluation findings, risk stratification, comorbidities and participant's personal goals.       Expected Outcomes Short Term: Increase workloads from initial exercise prescription for resistance, speed, and METs.;Short Term: Perform resistance training exercises routinely during rehab and add in resistance training at home;Long Term: Improve cardiorespiratory fitness, muscular endurance and strength as measured by increased METs and functional capacity (6MWT)       Able to understand and use rate of perceived exertion (RPE) scale Yes       Intervention Provide education and explanation on how to use RPE scale       Expected Outcomes Short Term: Able to use RPE daily in rehab to express subjective intensity level;Long Term:  Able to use RPE to guide intensity level when exercising independently       Able to understand and use Dyspnea scale Yes       Intervention Provide education and explanation on how to use Dyspnea scale       Expected Outcomes Short Term: Able to use Dyspnea scale daily in rehab to express subjective sense of shortness of breath during exertion;Long Term: Able to use Dyspnea scale to guide intensity level when exercising independently       Knowledge and understanding of Target Heart Rate Range (THRR) Yes       Intervention Provide  education and explanation of THRR including how the numbers were predicted and where they are located for reference       Expected Outcomes Short Term: Able to state/look up THRR;Long Term: Able to use THRR to govern intensity when exercising independently;Short Term: Able to use daily as guideline for intensity in rehab       Understanding of Exercise Prescription Yes       Intervention Provide education, explanation, and written materials on patient's individual exercise prescription       Expected Outcomes Short Term: Able to explain  program exercise prescription;Long Term: Able to explain home exercise prescription to exercise independently              Exercise Goals Re-Evaluation :   Discharge Exercise Prescription (Final Exercise Prescription Changes):   Nutrition:  Target Goals: Understanding of nutrition guidelines, daily intake of sodium 1500mg , cholesterol 200mg , calories 30% from fat and 7% or less from saturated fats, daily to have 5 or more servings of fruits and vegetables.  Biometrics:  Pre Biometrics - 08/15/20 1002      Pre Biometrics   Height 5\' 2"  (1.575 m)    Weight 83.4 kg    BMI (Calculated) 33.62    Grip Strength 27.5 kg            Nutrition Therapy Plan and Nutrition Goals:   Nutrition Assessments:   Nutrition Goals Re-Evaluation:   Nutrition Goals Discharge (Final Nutrition Goals Re-Evaluation):   Psychosocial: Target Goals: Acknowledge presence or absence of significant depression and/or stress, maximize coping skills, provide positive support system. Participant is able to verbalize types and ability to use techniques and skills needed for reducing stress and depression.  Initial Review & Psychosocial Screening:  Initial Psych Review & Screening - 08/15/20 1019      Initial Review   Current issues with History of Depression   No current depression     Family Dynamics   Good Support System? Yes   Her son and daughter-in-law live with  her     Barriers   Psychosocial barriers to participate in program There are no identifiable barriers or psychosocial needs.      Screening Interventions   Interventions Encouraged to exercise    Expected Outcomes --   No psychosocial needs are identified at this time.          Quality of Life Scores:  Scores of 19 and below usually indicate a poorer quality of life in these areas.  A difference of  2-3 points is a clinically meaningful difference.  A difference of 2-3 points in the total score of the Quality of Life Index has been associated with significant improvement in overall quality of life, self-image, physical symptoms, and general health in studies assessing change in quality of life.  PHQ-9: Recent Review Flowsheet Data    Depression screen [facility] 2/9 08/15/2020 08/15/2020   Decreased Interest 0 0   Down, Depressed, Hopeless 0 0   PHQ - 2 Score 0 0   Altered sleeping 0 -   Tired, decreased energy 0 -   Change in appetite 0 -   Feeling bad or failure about yourself  0 -   Trouble concentrating 0 -   Moving slowly or fidgety/restless 0 -   Suicidal thoughts 0 -   PHQ-9 Score 0 -   Difficult doing work/chores Not difficult at all -     Interpretation of Total Score  Total Score Depression Severity:  1-4 = Minimal depression, 5-9 = Mild depression, 10-14 = Moderate depression, 15-19 = Moderately severe depression, 20-27 = Severe depression   Psychosocial Evaluation and Intervention:  Psychosocial Evaluation - 08/15/20 1021      Psychosocial Evaluation & Interventions   Interventions Encouraged to exercise with the program and follow exercise prescription    Continue Psychosocial Services  No Follow up required           Psychosocial Re-Evaluation:  Psychosocial Re-Evaluation    [place] Name 08/15/20 1022  Psychosocial Re-Evaluation   Current issues with None Identified              Psychosocial Discharge (Final Psychosocial Re-Evaluation):   Psychosocial Re-Evaluation - 08/15/20 1022      Psychosocial Re-Evaluation   Current issues with None Identified           Education: Education Goals: Education classes will be provided on a weekly basis, covering required topics. Participant will state understanding/return demonstration of topics presented.  Learning Barriers/Preferences:  Learning Barriers/Preferences - 08/15/20 1022      Learning Barriers/Preferences   Learning Barriers None    Learning Preferences Audio;Computer/Internet;Group Instruction;Individual Instruction;Pictoral;Skilled Demonstration;Verbal Instruction;Video;Written Material           Education Topics: Risk Factor Reduction:  -Group instruction that is supported by a PowerPoint presentation. Instructor discusses the definition of a risk factor, different risk factors for pulmonary disease, and how the heart and lungs work together.     Nutrition for Pulmonary Patient:  -Group instruction provided by PowerPoint slides, verbal discussion, and written materials to support subject matter. The instructor gives an explanation and review of healthy diet recommendations, which includes a discussion on weight management, recommendations for fruit and vegetable consumption, as well as protein, fluid, caffeine, fiber, sodium, sugar, and alcohol. Tips for eating when patients are short of breath are discussed.   Pursed Lip Breathing:  -Group instruction that is supported by demonstration and informational handouts. Instructor discusses the benefits of pursed lip and diaphragmatic breathing and detailed demonstration on how to preform both.     Oxygen Safety:  -Group instruction provided by PowerPoint, verbal discussion, and written material to support subject matter. There is an overview of "What is Oxygen" and "Why do we need it".  Instructor also reviews how to create a safe environment for oxygen use, the importance of using oxygen as prescribed, and the risks  of noncompliance. There is a brief discussion on traveling with oxygen and resources the patient may utilize.   Oxygen Equipment:  -Group instruction provided by [facility] Staff utilizing handouts, written materials, and equipment demonstrations.   Signs and Symptoms:  -Group instruction provided by written material and verbal discussion to support subject matter. Warning signs and symptoms of infection, stroke, and heart attack are reviewed and when to call the physician/911 reinforced. Tips for preventing the spread of infection discussed.   Advanced Directives:  -Group instruction provided by verbal instruction and written material to support subject matter. Instructor reviews Advanced Directive laws and proper instruction for filling out document.   Pulmonary Video:  -Group video education that reviews the importance of medication and oxygen compliance, exercise, good nutrition, pulmonary hygiene, and pursed lip and diaphragmatic breathing for the pulmonary patient.   Exercise for the Pulmonary Patient:  -Group instruction that is supported by a PowerPoint presentation. Instructor discusses benefits of exercise, core components of exercise, frequency, duration, and intensity of an exercise routine, importance of utilizing pulse oximetry during exercise, safety while exercising, and options of places to exercise outside of rehab.     Pulmonary Medications:  -Verbally interactive group education provided by instructor with focus on inhaled medications and proper administration.   Anatomy and Physiology of the Respiratory System and Intimacy:  -Group instruction provided by PowerPoint, verbal discussion, and written material to support subject matter. Instructor reviews respiratory cycle and anatomical components of the respiratory system and their functions. Instructor also reviews differences in obstructive and restrictive respiratory diseases with examples of each. Intimacy, Sex,  and  Sexuality differences are reviewed with a discussion on how relationships can change when diagnosed with pulmonary disease. Common sexual concerns are reviewed.   MD DAY -A group question and answer session with a medical doctor that allows participants to ask questions that relate to their pulmonary disease state.   OTHER EDUCATION -Group or individual verbal, written, or video instructions that support the educational goals of the pulmonary rehab program.   Holiday Eating Survival Tips:  -Group instruction provided by PowerPoint slides, verbal discussion, and written materials to support subject matter. The instructor gives patients tips, tricks, and techniques to help them not only survive but enjoy the holidays despite the onslaught of food that accompanies the holidays.   Knowledge Questionnaire Score:  Knowledge Questionnaire Score - 08/15/20 1022      Knowledge Questionnaire Score   Pre Score 17/18           Core Components/Risk Factors/Patient Goals at Admission:  Personal Goals and Risk Factors at Admission - 08/15/20 1022      Core Components/Risk Factors/Patient Goals on Admission   Tobacco Cessation Yes    Number of packs per day 5 cigarettes per day, is ready to quit    Intervention Assist the participant in steps to quit. Provide individualized education and counseling about committing to Tobacco Cessation, relapse prevention, and pharmacological support that can be provided by physician.;Advice worker, assist with locating and accessing local/national Quit Smoking programs, and support quit date choice.    Expected Outcomes Short Term: Will demonstrate readiness to quit, by selecting a quit date.;Long Term: Complete abstinence from all tobacco products for at least 12 months from quit date.    Improve shortness of breath with ADL's Yes    Intervention Provide education, individualized exercise plan and daily activity instruction to help decrease  symptoms of SOB with activities of daily living.    Expected Outcomes Short Term: Improve cardiorespiratory fitness to achieve a reduction of symptoms when performing ADLs;Long Term: Be able to perform more ADLs without symptoms or delay the onset of symptoms           Core Components/Risk Factors/Patient Goals Review:   Goals and Risk Factor Review    Row Name 08/15/20 1024             Core Components/Risk Factors/Patient Goals Review   Personal Goals Review Tobacco Cessation;Improve shortness of breath with ADL's;Develop more efficient breathing techniques such as purse lipped breathing and diaphragmatic breathing and practicing self-pacing with activity.;Increase knowledge of respiratory medications and ability to use respiratory devices properly.              Core Components/Risk Factors/Patient Goals at Discharge (Final Review):   Goals and Risk Factor Review - 08/15/20 1024      Core Components/Risk Factors/Patient Goals Review   Personal Goals Review Tobacco Cessation;Improve shortness of breath with ADL's;Develop more efficient breathing techniques such as purse lipped breathing and diaphragmatic breathing and practicing self-pacing with activity.;Increase knowledge of respiratory medications and ability to use respiratory devices properly.           ITP Comments:   Comments:

## 2020-08-23 ENCOUNTER — Ambulatory Visit (HOSPITAL_COMMUNITY): Payer: 59

## 2020-08-23 ENCOUNTER — Telehealth (HOSPITAL_COMMUNITY): Payer: Self-pay | Admitting: Internal Medicine

## 2020-08-23 ENCOUNTER — Telehealth (HOSPITAL_COMMUNITY): Payer: Self-pay | Admitting: *Deleted

## 2020-08-24 NOTE — Telephone Encounter (Signed)
Dr. Carlis Abbott please advise on the following MyChart message. Thank you   I was diagnosed with shingles on the 8 of nov. Its on my left arm and hand. I was suppose to start Pulmonary Rehab on the 9 of nov. I will probably get bumped from the class because of the shingles.  I understand shingles can lead to pneumonia, what do I need to do to prevent that from happening? My immune system apparently is so bad that I seem to be picking up anything and everything.  My Primary care  Dr put me on VALACYCLOVIR HCL 1 GRAM TABLETS. 1 TABLET 3 TIMES A DAY. Please advise I don't want it to turn into pneumonia.  Thank  you. I've been trying to set up my new patient appt with Dr. Erin Fulling but phone wait time has been over 30 minutes everytime I've called. Can you do this for me or do I need to keep trying. Honestly THANKS for everything.

## 2020-08-25 ENCOUNTER — Ambulatory Visit (HOSPITAL_COMMUNITY): Payer: 59

## 2020-08-25 NOTE — Telephone Encounter (Signed)
Per pulmonary rehab staff pt will not be able to finish up pulmonary rehab do to not feeling well. She will reschedule at a later time. Canceled her remaining appts for pulmonary rehab.

## 2020-08-29 NOTE — Telephone Encounter (Signed)
Dr. Ainsley Spinner reply was passed on to pt. Nothing further needed at this time.

## 2020-08-30 ENCOUNTER — Ambulatory Visit (HOSPITAL_COMMUNITY): Payer: 59

## 2020-09-01 ENCOUNTER — Ambulatory Visit (HOSPITAL_COMMUNITY): Payer: 59

## 2020-09-01 NOTE — Progress Notes (Signed)
Jillian [name] has confirmed that she has shingles and could be contagious for 3-5 weeks and does not want to start the exercises component of pulmonary rehab.  She would like to be discharged at this time and will ask her doctor for another referral when she has recovered.

## 2020-09-06 ENCOUNTER — Ambulatory Visit (HOSPITAL_COMMUNITY): Payer: 59

## 2020-09-06 NOTE — Progress Notes (Signed)
Discharge Progress Report  Patient Details  Name: Jillian [name] MRN: [id number] Date of Birth: 14-Nov-1965 Referring Provider:     Pulmonary Rehab Walk Test from 08/15/2020 in [facility]  Referring Provider Dr. Carlis Abbott       Number of Visits: 0  Reason for Discharge:  Early Exit:  Patient developed shingles between orientation and her first day of exercise.  She would be contagious 3-5 weeks and wanted to be discharged.  When recovered she will ask her MD for another referral.  Smoking History:  Social History   Tobacco Use  Smoking Status Current Every Day Smoker  . Packs/day: 0.25  . Years: 55.00  . Pack years: 13.75  . Types: Cigarettes  Smokeless Tobacco Never Used  Tobacco Comment   5-6 a day    Diagnosis:  Chronic obstructive pulmonary disease, unspecified COPD type ([place])  ADL [facility]:  Pulmonary Assessment Scores    Row Name 08/15/20 1016 08/15/20 1254       ADL [facility]   ADL Phase Entry --    SOB Score total 96 --      CAT Score   CAT Score 39 --      mMRC Score   mMRC Score -- 4           Initial Exercise Prescription:  Initial Exercise Prescription - 08/15/20 1300      Date of Initial Exercise RX and Referring Provider   Date 08/15/20    Referring Provider Dr. Carlis Abbott    Expected Discharge Date 10/20/20      Oxygen   Oxygen Continuous    Liters 3      NuStep   Level 2    Minutes 15      Track   Minutes 15      Prescription Details   Frequency (times per week) 2    Duration Progress to 30 minutes of continuous aerobic without signs/symptoms of physical distress      Intensity   THRR 40-80% of Max Heartrate 66-132    Ratings of Perceived Exertion 11-13    Perceived Dyspnea 0-4      Progression   Progression Continue to progress workloads to maintain intensity without signs/symptoms of physical distress.      Resistance Training   Training Prescription Yes    Weight orange bands    Reps 10-15            Discharge Exercise Prescription (Final Exercise Prescription Changes):   Functional Capacity:  6 Minute Walk    Row Name 08/15/20 1251         6 Minute Walk   Phase Initial     Distance 1351 feet     Walk Time 6 minutes     # of Rest Breaks 0     MPH 2.55     METS 3.72     RPE 13     Perceived Dyspnea  3     VO2 Peak 13.04     Symptoms No     Resting HR 86 bpm     Resting BP 132/76     Resting Oxygen Saturation  99 %     Exercise Oxygen Saturation  during 6 min walk 95 %     Max Ex. HR 112 bpm     Max Ex. BP 162/50     2 Minute Post BP 120/50       Interval HR   1 Minute HR 97  2 Minute HR 106     3 Minute HR 105     4 Minute HR 105     5 Minute HR 107     6 Minute HR 112     2 Minute Post HR 92     Interval Heart Rate? Yes       Interval Oxygen   Interval Oxygen? Yes     Baseline Oxygen Saturation % 99 %     1 Minute Oxygen Saturation % 97 %     1 Minute Liters of Oxygen 3 L     2 Minute Oxygen Saturation % 97 %     2 Minute Liters of Oxygen 3 L     3 Minute Oxygen Saturation % 96 %     3 Minute Liters of Oxygen 3 L     4 Minute Oxygen Saturation % 95 %     4 Minute Liters of Oxygen 3 L     5 Minute Oxygen Saturation % 95 %     5 Minute Liters of Oxygen 3 L     6 Minute Oxygen Saturation % 95 %     6 Minute Liters of Oxygen 3 L     2 Minute Post Oxygen Saturation % 98 %     2 Minute Post Liters of Oxygen 3 L            Psychological, QOL, Others - Outcomes: PHQ 2/9: Depression screen [employer] 2/9 08/15/2020 08/15/2020  Decreased Interest 0 0  Down, Depressed, Hopeless 0 0  PHQ - 2 Score 0 0  Altered sleeping 0 -  Tired, decreased energy 0 -  Change in appetite 0 -  Feeling bad or failure about yourself  0 -  Trouble concentrating 0 -  Moving slowly or fidgety/restless 0 -  Suicidal thoughts 0 -  PHQ-9 Score 0 -  Difficult doing work/chores Not difficult at all -    Quality of Life:   Personal Goals: Goals established at orientation  with interventions provided to work toward goal.  Personal Goals and Risk Factors at Admission - 08/15/20 1022      Core Components/Risk Factors/Patient Goals on Admission   Tobacco Cessation Yes    Number of packs per day 5 cigarettes per day, is ready to quit    Intervention Assist the participant in steps to quit. Provide individualized education and counseling about committing to Tobacco Cessation, relapse prevention, and pharmacological support that can be provided by physician.;Advice worker, assist with locating and accessing local/national Quit Smoking programs, and support quit date choice.    Expected Outcomes Short Term: Will demonstrate readiness to quit, by selecting a quit date.;Long Term: Complete abstinence from all tobacco products for at least 12 months from quit date.    Improve shortness of breath with ADL's Yes    Intervention Provide education, individualized exercise plan and daily activity instruction to help decrease symptoms of SOB with activities of daily living.    Expected Outcomes Short Term: Improve cardiorespiratory fitness to achieve a reduction of symptoms when performing ADLs;Long Term: Be able to perform more ADLs without symptoms or delay the onset of symptoms            Personal Goals Discharge:  Goals and Risk Factor Review    Row Name 08/15/20 1024             Core Components/Risk Factors/Patient Goals Review   Personal Goals Review Tobacco Cessation;Improve shortness of breath with  ADL's;Develop more efficient breathing techniques such as purse lipped breathing and diaphragmatic breathing and practicing self-pacing with activity.;Increase knowledge of respiratory medications and ability to use respiratory devices properly.              Exercise Goals and Review:  Exercise Goals    Row Name 08/15/20 1244             Exercise Goals   Increase Physical Activity Yes       Intervention Provide advice, education, support and  counseling about physical activity/exercise needs.;Develop an individualized exercise prescription for aerobic and resistive training based on initial evaluation findings, risk stratification, comorbidities and participant's personal goals.       Expected Outcomes Short Term: Attend rehab on a regular basis to increase amount of physical activity.;Long Term: Add in home exercise to make exercise part of routine and to increase amount of physical activity.;Long Term: Exercising regularly at least 3-5 days a week.       Increase Strength and Stamina Yes       Intervention Provide advice, education, support and counseling about physical activity/exercise needs.;Develop an individualized exercise prescription for aerobic and resistive training based on initial evaluation findings, risk stratification, comorbidities and participant's personal goals.       Expected Outcomes Short Term: Increase workloads from initial exercise prescription for resistance, speed, and METs.;Short Term: Perform resistance training exercises routinely during rehab and add in resistance training at home;Long Term: Improve cardiorespiratory fitness, muscular endurance and strength as measured by increased METs and functional capacity (6MWT)       Able to understand and use rate of perceived exertion (RPE) scale Yes       Intervention Provide education and explanation on how to use RPE scale       Expected Outcomes Short Term: Able to use RPE daily in rehab to express subjective intensity level;Long Term:  Able to use RPE to guide intensity level when exercising independently       Able to understand and use Dyspnea scale Yes       Intervention Provide education and explanation on how to use Dyspnea scale       Expected Outcomes Short Term: Able to use Dyspnea scale daily in rehab to express subjective sense of shortness of breath during exertion;Long Term: Able to use Dyspnea scale to guide intensity level when exercising independently        Knowledge and understanding of Target Heart Rate Range (THRR) Yes       Intervention Provide education and explanation of THRR including how the numbers were predicted and where they are located for reference       Expected Outcomes Short Term: Able to state/look up THRR;Long Term: Able to use THRR to govern intensity when exercising independently;Short Term: Able to use daily as guideline for intensity in rehab       Understanding of Exercise Prescription Yes       Intervention Provide education, explanation, and written materials on patient's individual exercise prescription       Expected Outcomes Short Term: Able to explain program exercise prescription;Long Term: Able to explain home exercise prescription to exercise independently              Exercise Goals Re-Evaluation:   Nutrition & Weight - Outcomes:  Pre Biometrics - 08/15/20 1002      Pre Biometrics   Height 5\' 2"  (1.575 m)    Weight 83.4 kg    BMI (Calculated) 33.62  Grip Strength 27.5 kg            Nutrition:   Nutrition Discharge:   Education Questionnaire Score:  Knowledge Questionnaire Score - 08/15/20 1022      Knowledge Questionnaire Score   Pre Score 17/18           Goals reviewed with patient; copy given to patient.

## 2020-09-13 ENCOUNTER — Ambulatory Visit (HOSPITAL_COMMUNITY): Payer: 59

## 2020-09-15 ENCOUNTER — Other Ambulatory Visit: Payer: Self-pay

## 2020-09-15 ENCOUNTER — Ambulatory Visit (INDEPENDENT_AMBULATORY_CARE_PROVIDER_SITE_OTHER): Payer: 59 | Admitting: Pulmonary Disease

## 2020-09-15 ENCOUNTER — Encounter: Payer: Self-pay | Admitting: Pulmonary Disease

## 2020-09-15 ENCOUNTER — Ambulatory Visit (HOSPITAL_COMMUNITY): Payer: 59

## 2020-09-15 DIAGNOSIS — Z720 Tobacco use: Secondary | ICD-10-CM

## 2020-09-15 DIAGNOSIS — R918 Other nonspecific abnormal finding of lung field: Secondary | ICD-10-CM

## 2020-09-15 NOTE — Progress Notes (Signed)
09/15/20   Peer to peer: CT chest without contrast Dr. Eather Colas   CT chest without contrast tonight as it was associated to COPD.  They are requesting a chest x-ray.  Reviewed with insurance clinician that this CT was actually for following up pulmonary nodules based off December/2020 recommendations from radiology where patient had multiple bilateral pulmonary nodules that were stable.  They recommended 12 to 5-month follow-up.  We need to send a printed copy of the December/2020 CT chest without contrast to the insurance agency.  You can also print and send a copy of this note.  Please utilize the fax number listed below:  Fax number: [phone]  Case number [id number] with name and dob  Insurance clinician did request having the case number as well as the patient's name and date of birth written on the faxed information.  We received the approval number listed below:  Approval number: [id number]  We will route message to patient care coordinators as [place].  Wyn Quaker, FNP

## 2020-09-19 ENCOUNTER — Other Ambulatory Visit: Payer: Self-pay

## 2020-09-19 ENCOUNTER — Ambulatory Visit (HOSPITAL_COMMUNITY)
Admission: RE | Admit: 2020-09-19 | Discharge: 2020-09-19 | Disposition: A | Discharge status: Home / Self Care | Payer: 59 | Source: Ambulatory Visit | Attending: Acute Care | Admitting: Acute Care

## 2020-09-19 DIAGNOSIS — R918 Other nonspecific abnormal finding of lung field: Secondary | ICD-10-CM | POA: Diagnosis present

## 2020-09-20 ENCOUNTER — Ambulatory Visit (HOSPITAL_COMMUNITY): Payer: 59

## 2020-09-21 ENCOUNTER — Other Ambulatory Visit: Payer: Self-pay

## 2020-09-21 ENCOUNTER — Ambulatory Visit (INDEPENDENT_AMBULATORY_CARE_PROVIDER_SITE_OTHER): Payer: 59 | Admitting: Pulmonary Disease

## 2020-09-21 ENCOUNTER — Encounter: Payer: Self-pay | Admitting: Pulmonary Disease

## 2020-09-21 VITALS — BP 118/64 | HR 94 | Temp 98.7°F | Ht 62.0 in | Wt 182.2 lb

## 2020-09-21 DIAGNOSIS — R918 Other nonspecific abnormal finding of lung field: Secondary | ICD-10-CM

## 2020-09-21 DIAGNOSIS — J449 Chronic obstructive pulmonary disease, unspecified: Secondary | ICD-10-CM | POA: Diagnosis not present

## 2020-09-21 DIAGNOSIS — J9611 Chronic respiratory failure with hypoxia: Secondary | ICD-10-CM

## 2020-09-21 DIAGNOSIS — G4733 Obstructive sleep apnea (adult) (pediatric): Secondary | ICD-10-CM | POA: Diagnosis not present

## 2020-09-21 MED ORDER — BREZTRI AEROSPHERE 160-9-4.8 MCG/ACT IN AERO: 2.0000 | INHALATION_SPRAY | Freq: Two times a day (BID) | RESPIRATORY_TRACT | 3 refills | Status: DC

## 2020-09-21 NOTE — Progress Notes (Signed)
Synopsis: Returns to clinic for follow up of COPD, former Dr. Ernest Mallick patient  Subjective:   PATIENT ID: Jillian [name], Jillian [name]   HPI  Chief Complaint  Patient presents with  . 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   She has been doing ok since her last visit with Dr. Carlis Abbott in October and has not required prednisone treatment since then. She has been using advair HFA 115-61mcg 2 puffs twice daily along with spiriva respimat 2.50mcg 2 puffs daily along with as needed duonebs which she has been using twice a day.   She continues to experience shortness of breath with her activities of daily living. She has to sit in the shower now as she had been getting too short of breath with standing to bath herself.   She was enrolled in cardiopulmonary rehab but developed shingles during her first week and had to stop therapy until she had recovered from the zoster rash. She is currently on the waiting list.   She has been treated with prednisone for COPD exacerbations at least 3 times this year per record review and possibly more as her PCP is on another EMR.   She continues to smoke 1 pack per day of cigarettes. She had cut back to 5 cigarettes daily but due to social stressors that include her job and disability coverage she has increased her daily smoking. She has tried chantix in the past but stopped due to chest pressure like an "elephant sitting on my chest". Nicotine inhaler and gum didn't help, and she continued to smoke on her patches. She did not tolerate Wellbutrin well. She has patches and is interested in trying them again. Anxiety is contributing to her difficulty quitting.  Past Medical History:  Diagnosis Date  .  Anxiety   . Arthritis   . Asthma   . Depression   . High blood pressure   . History of bladder problems   . Migraines   . Sleep apnea      Family History  Problem Relation Age of Onset  . Diabetes Mother   . COPD Mother   . Heart attack Father   . Diabetes Sister   . Colon cancer Neg Hx   . Celiac disease Neg Hx   . Inflammatory bowel disease Neg Hx      Social History   Socioeconomic History  . Marital status: Divorced    Spouse name: Not on file  . Number of children: Not on file  . Years of education: Not on file  . Highest education level: Not on file  Occupational History  . Not on file  Tobacco Use  . Smoking status: Current Every Day Smoker    Packs/day: 1.00    Years: 55.00    Pack years: 55.00    Types: Cigarettes  . Smokeless tobacco: Never Used  . Tobacco comment: 5-6 a day  Vaping Use  . Vaping Use: Never used  Substance and Sexual Activity  . Alcohol use: Yes    Comment: hardly ever  . Drug use: No  . Sexual activity: Not on file  Other Topics Concern  . Not on file  Social History Narrative  . Not on file  Social Determinants of Health   Financial Resource Strain:   . Difficulty of Paying Living Expenses: Not on file  Food Insecurity:   . Worried About Charity fundraiser in the Last Year: Not on file  . Ran Out of Food in the Last Year: Not on file  Transportation Needs:   . Lack of Transportation (Medical): Not on file  . Lack of Transportation (Non-Medical): Not on file  Physical Activity:   . Days of Exercise per Week: Not on file  . Minutes of Exercise per Session: Not on file  Stress:   . Feeling of Stress : Not on file  Social Connections:   . Frequency of Communication with Friends and Family: Not on file  . Frequency of Social Gatherings with Friends and Family: Not on file  . Attends Religious Services: Not on file  . Active Member of Clubs or Organizations: Not on file  . Attends Archivist Meetings: Not on  file  . Marital Status: Not on file  Intimate Partner Violence:   . Fear of Current or Ex-Partner: Not on file  . Emotionally Abused: Not on file  . Physically Abused: Not on file  . Sexually Abused: Not on file     Allergies  Allergen Reactions  . Doxycycline Photosensitivity    Broke out with blisters after being in the sun  . Banana Hives     Outpatient Medications Prior to Visit  Medication Sig Dispense Refill  . Aspirin-Acetaminophen-Caffeine (GOODY HEADACHE PO) Take by mouth as needed.    Marland Kitchen azelastine (ASTELIN) 0.1 % nasal spray Place 2 sprays into both nostrils 2 (two) times daily. Use in each nostril as directed 30 mL 11  . azithromycin (ZITHROMAX) 250 MG tablet Take two today and then one daily until finished. 6 tablet 0  . DULoxetine (CYMBALTA) 60 MG capsule Take 60 mg by mouth 2 (two) times daily.     Marland Kitchen ELDERBERRY PO Take 2 capsules by mouth daily.    Marland Kitchen gabapentin (NEURONTIN) 300 MG capsule Take 300 mg by mouth 3 (three) times daily.    Marland Kitchen ipratropium-albuterol (DUONEB) 0.5-2.5 (3) MG/3ML SOLN Take 3 mLs by nebulization every 4 (four) hours as needed. 360 mL 3  . loratadine (CLARITIN) 10 MG tablet Take 10 mg by mouth daily.    Marland Kitchen lubiprostone (AMITIZA) 24 MCG capsule TAKE ONE CAPSULE ONCE TO TWICE DAILY WITH FOOD FOR CONSTIPATION 180 capsule 1  . methocarbamol (ROBAXIN) 500 MG tablet Take 500 mg by mouth 2 (two) times daily.    . mometasone (NASONEX) 50 MCG/ACT nasal spray Place 1 spray into the nose as needed.    . pantoprazole (PROTONIX) 40 MG tablet Take 1 tablet by mouth 2 (two) times daily before a meal.     . Sodium Bicarbonate-Citric Acid (ALKA-SELTZER HEARTBURN PO) Take by mouth daily.    . valsartan-hydrochlorothiazide (DIOVAN-HCT) 160-12.5 MG tablet Take 1 tablet by mouth daily.    . vitamin B-12 (CYANOCOBALAMIN) 1000 MCG tablet Take 1,000 mcg by mouth daily.    . Vitamin D, Ergocalciferol, (DRISDOL) 1.25 MG (50000 UT) CAPS capsule Take 1 capsule by mouth once a  week.    Marland Kitchen ADVAIR HFA 115-21 MCG/ACT inhaler USE 2 PUFFS BY MOUTH TWICE A DAY 36 each 1  . Tiotropium Bromide Monohydrate (SPIRIVA RESPIMAT) 2.5 MCG/ACT AERS Inhale 2 puffs into the lungs daily. 12 g 3  . albuterol (VENTOLIN HFA) 108 (90 Base) MCG/ACT inhaler Inhale 2 puffs into the lungs  every 4 (four) hours as needed for wheezing or shortness of breath. (Patient not taking: Reported on 09/21/2020) 48 g 3   No facility-administered medications prior to visit.    Review of Systems  Constitutional: Positive for malaise/fatigue. Negative for chills, fever and weight loss.  HENT: Negative for congestion and sore throat.   Eyes: Negative.   Respiratory: Positive for cough, sputum production, shortness of breath and wheezing. Negative for hemoptysis.   Cardiovascular: Negative for chest pain, orthopnea, leg swelling and PND.  Gastrointestinal: Negative for abdominal pain, heartburn, nausea and vomiting.  Genitourinary: Negative.   Musculoskeletal: Negative for joint pain and myalgias.  Neurological: Positive for weakness. Negative for dizziness and headaches.  Endo/Heme/Allergies: Negative.   Psychiatric/Behavioral: The patient is nervous/anxious.     Objective:   Vitals:   09/21/20 1020  BP: 118/64  Pulse: 94  Temp: 98.7 F (37.1 C)  TempSrc: Oral  SpO2: 98%  Weight: 182 lb 3.2 oz (82.6 kg)  Height: 5\' 2"  (1.575 m)     Physical Exam Constitutional:      Appearance: She is not ill-appearing.  Eyes:     Conjunctiva/sclera: Conjunctivae normal.     Pupils: Pupils are equal, round, and reactive to light.  Cardiovascular:     Rate and Rhythm: Normal rate and regular rhythm.     Pulses: Normal pulses.     Heart sounds: Normal heart sounds. No murmur heard.   Pulmonary:     Effort: Pulmonary effort is normal.     Breath sounds: Normal breath sounds. No wheezing, rhonchi or rales.  Abdominal:     General: Bowel sounds are normal.     Palpations: Abdomen is soft.   Musculoskeletal:     Right lower leg: No edema.     Left lower leg: No edema.  Skin:    General: Skin is warm and dry.     Capillary Refill: Capillary refill takes less than 2 seconds.  Neurological:     Mental Status: She is alert.  Psychiatric:        Mood and Affect: Mood normal.        Behavior: Behavior normal.        Thought Content: Thought content normal.        Judgment: Judgment normal.     CBC    Component Value Date/Time   WBC 13.0 (H) 11/25/2019 1540   RBC 5.17 (H) 11/25/2019 1540   HGB 15.5 (H) 11/25/2019 1540   HGB 14.9 08/22/2017 0849   HCT 47.4 (H) 11/25/2019 1540   HCT 44.5 08/22/2017 0849   PLT 313.0 11/25/2019 1540   MCV 91.8 11/25/2019 1540   MCV 96 08/22/2017 0849   MCH 31.7 07/10/2019 1125   MCHC 32.6 11/25/2019 1540   RDW 15.5 11/25/2019 1540   RDW 14.7 08/22/2017 0849   LYMPHSABS 2.7 11/25/2019 1540   LYMPHSABS 2.3 08/22/2017 0849   MONOABS 1.1 (H) 11/25/2019 1540   EOSABS 1.2 (H) 11/25/2019 1540   EOSABS 1.0 (H) 08/22/2017 0849   BASOSABS 0.2 (H) 11/25/2019 1540   BASOSABS 0.1 08/22/2017 0849   Chest imaging: CT Chest WO Contrast 09/19/20 1. Pulmonary emphysema. 2. Background pattern of vague centrilobular pulmonary nodules with apical predominance, millimetric and unchanged. Findings may be indicative of respiratory bronchiolitis correlate with any smoking history. 3. Multiple small pulmonary nodules, not changed since December of 2020 and most not changed since July of 2020. Consider additional six-month follow-up for the largest in the RIGHT lower lobe  which is not well visualized but present on the study of May 13, 2019, perhaps due to differences in technique and slice thickness. 4. Other pulmonary nodules with benign and stable appearance. 5. aortic atherosclerosis.  PFT: PFT Results Latest Ref Rng & Units 01/18/2020  FVC-Pre L 1.58  FVC-Predicted Pre % 49  FVC-Post L 2.04  FVC-Predicted Post % 63  Pre FEV1/FVC % % 67  Post  FEV1/FCV % % 70  FEV1-Pre L 1.06  FEV1-Predicted Pre % 41  FEV1-Post L 1.43  DLCO uncorrected ml/min/mmHg 13.53  DLCO UNC% % 69  DLCO corrected ml/min/mmHg 12.78  DLCO COR %Predicted % 65  DLVA Predicted % 71  TLC L 4.34  TLC % Predicted % 89  RV % Predicted % 144   Labs: 12/09/19 Alpha-1 Anti-trypsin 184mg /dL (83 - 199 mg/dL), Phenotype MM  Echo: 12/31/19 1. Left ventricular ejection fraction, by estimation, is >75%. The left  ventricle has hyperdynamic function. The left ventricle has no regional  wall motion abnormalities. Left ventricular diastolic parameters were  normal.  2. Right ventricular systolic function is normal. The right ventricular  size is normal. There is normal pulmonary artery systolic pressure. The  estimated right ventricular systolic pressure is 9.8 mmHg.  3. The mitral valve is grossly normal. No evidence of mitral valve  regurgitation.  4. The aortic valve is tricuspid. Noncoronary cusp is mildly thickened.  Aortic valve regurgitation is not visualized.  5. The inferior vena cava is normal in size with greater than 50%  respiratory variability, suggesting right atrial pressure of 3 mmHg.  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   She continues to be symptomatic with dyspnea and wheezing despite being on Advair and Spiriva. She has also had multiple COPD exacerbations this year requiring prednisone. We will start her on Breztri Aerosphere inhaler to be used 2 puffs twice daily. We will also provide her with a spacer to be used with the  [employer]. She has a month supply of Breztri samples and a prescription has been sent to her pharmacy. She is to stop advair and spiriva while on the [place].   She is awaiting re-enrollment in the cardiopulmonary rehab which I believe she will greatly benefit from as she is apprehensive in performing physical activity on her own.   She has fatigue throughout the day and has elevated serum bicarbonate levels on her previous chemistry panels. She does snore at night so there is concern for underlying obstructive sleep apnea. We will schedule her for a split night sleep study.    We discussed smoking cessation and I encouraged her to use the nicotine patches and cut back down to 5 cigarettes daily. Encouraged her to go for a walk or drive when she felt anxious which triggers her to smoke.   Her pulmonary nodules on CT Chest 09/2020 appear to be stable except for an enlarging RLL nodule noted by radiology and has recommended 6 month follow up. If this nodule is stable on follow up then we will likely proceed with annual screening 12 months from that study.   Follow up visit in  6 months. Freda Jackson, MD [facility] Office: [phone]   See Amion for Pager Details    Current Outpatient Medications:  .  Aspirin-Acetaminophen-Caffeine (GOODY HEADACHE PO), Take by mouth as needed., Disp: , Rfl:  .  azelastine (ASTELIN) 0.1 % nasal spray, Place 2 sprays into both nostrils 2 (two) times daily. Use in each nostril as directed, Disp: 30 mL, Rfl: 11 .  azithromycin (ZITHROMAX) 250 MG tablet, Take two today and then one daily until finished., Disp: 6 tablet, Rfl: 0 .  DULoxetine (CYMBALTA) 60 MG capsule, Take 60 mg by mouth 2 (two) times daily. , Disp: , Rfl:  .  ELDERBERRY PO, Take 2 capsules by mouth daily., Disp: , Rfl:  .  gabapentin (NEURONTIN) 300 MG capsule, Take 300 mg by mouth 3 (three) times daily., Disp: , Rfl:  .  ipratropium-albuterol (DUONEB) 0.5-2.5 (3) MG/3ML  SOLN, Take 3 mLs by nebulization every 4 (four) hours as needed., Disp: 360 mL, Rfl: 3 .  loratadine (CLARITIN) 10 MG tablet, Take 10 mg by mouth daily., Disp: , Rfl:  .  lubiprostone (AMITIZA) 24 MCG capsule, TAKE ONE CAPSULE ONCE TO TWICE DAILY WITH FOOD FOR CONSTIPATION, Disp: 180 capsule, Rfl: 1 .  methocarbamol (ROBAXIN) 500 MG tablet, Take 500 mg by mouth 2 (two) times daily., Disp: , Rfl:  .  mometasone (NASONEX) 50 MCG/ACT nasal spray, Place 1 spray into the nose as needed., Disp: , Rfl:  .  pantoprazole (PROTONIX) 40 MG tablet, Take 1 tablet by mouth 2 (two) times daily before a meal. , Disp: , Rfl:  .  Sodium Bicarbonate-Citric Acid (ALKA-SELTZER HEARTBURN PO), Take by mouth daily., Disp: , Rfl:  .  valsartan-hydrochlorothiazide (DIOVAN-HCT) 160-12.5 MG tablet, Take 1 tablet by mouth daily., Disp: , Rfl:  .  vitamin B-12 (CYANOCOBALAMIN) 1000 MCG tablet, Take 1,000 mcg by mouth daily., Disp: , Rfl:  .  Vitamin D, Ergocalciferol, (DRISDOL) 1.25 MG (50000 UT) CAPS capsule, Take 1 capsule by mouth once a week., Disp: , Rfl:  .  albuterol (VENTOLIN HFA) 108 (90 Base) MCG/ACT inhaler, Inhale 2 puffs into the lungs every 4 (four) hours as needed for wheezing or shortness of breath. (Patient not taking: Reported on 09/21/2020), Disp: 48 g, Rfl: 3 .  Budeson-Glycopyrrol-Formoterol (BREZTRI AEROSPHERE) 160-9-4.8 MCG/ACT AERO, Inhale 2 puffs into the lungs in the morning and at bedtime., Disp: 32.1 g, Rfl: 3

## 2020-09-21 NOTE — Patient Instructions (Addendum)
Start Breztri inhaler 2 puffs twice daily (rinse mouth out with water after use) - Can use with spacer chamber   Start to use flutter valve after nebulizer treatments for mucous clearance.  Stop advair and spiriva while on Breztri  Continue as needed nebulizer treatments  We will schedule you for split night sleep study  Follow up CT Chest scan in 6 months for pulmonary nodules  Call if symptoms worsen and we can see you in clinic sooner

## 2020-09-22 ENCOUNTER — Ambulatory Visit (HOSPITAL_COMMUNITY): Payer: 59

## 2020-09-22 NOTE — Progress Notes (Signed)
Please call patient and let her know her CT scan shows stable nodules since the last time we checked.  Let her know that radiology is recommending a 58-month follow-up to evaluate 1 nodule in the right lower lobe that was not well visualized on the scan done July 2020. Please place order for follow-up CT chest without contrast for June 2022. If patient has any questions or concerns please schedule her for an office visit with me. Thanks so much

## 2020-09-23 NOTE — Telephone Encounter (Signed)
Dr. Erin Fulling please advise on patient mychart message for letter.     Hi Dr. Erin Fulling. My work place needs a note stating that I am still unable to work due to my condition. Dr. Carlis Abbott had taken me completely out in April and thats when I filed for Social Security/Disability  and Retirement /Disability  thru work. I need this note for continued continued coverage. Thank you

## 2020-09-23 NOTE — Telephone Encounter (Signed)
Per Dr. Erin Fulling letter has been written for patient. Called and spoke with patient to see if she wanted letter mailed, picked up or sent through mychart. Patient stated that she would come and pick it up Monday. Letter placed up front for pick up. Nothing further needed at this time.

## 2020-09-27 ENCOUNTER — Ambulatory Visit (HOSPITAL_COMMUNITY): Payer: 59

## 2020-09-29 ENCOUNTER — Ambulatory Visit (HOSPITAL_COMMUNITY): Payer: 59

## 2020-09-29 MED ORDER — AZITHROMYCIN 250 MG PO TABS: ORAL_TABLET | ORAL | 0 refills | Status: DC

## 2020-09-29 MED ORDER — PREDNISONE 20 MG PO TABS: 40.0000 mg | ORAL_TABLET | Freq: Every day | ORAL | 0 refills | Status: DC

## 2020-09-29 NOTE — Telephone Encounter (Signed)
Spoke with pt d/t nature of the message. Pt does want to take the prednisone and zpak as suggested by Dr. Erin Fulling.  Sent this to preferred pharmacy.  Nothing further needed at this time- will close encounter.

## 2020-09-29 NOTE — Telephone Encounter (Signed)
Dr. Erin Fulling please advise on patient mychart message  Hi. Wanted to  Let you know that I am wheezing and coughing a whole lot and my chest is tight my head is also stopped up and congested with a headache. I've been using the new inhaler but have switched back to the spariva because I have been feeling bad since late Monday. wasn't sure if it was the new inhaler or if I have picked up a cold. It really feels like a cold or bug.  Was hoping you could call me in something before it gets any worse.

## 2020-10-04 ENCOUNTER — Ambulatory Visit (HOSPITAL_COMMUNITY): Payer: 59

## 2020-10-06 ENCOUNTER — Ambulatory Visit (HOSPITAL_COMMUNITY): Payer: 59

## 2020-10-11 ENCOUNTER — Ambulatory Visit (HOSPITAL_COMMUNITY): Payer: 59

## 2020-10-13 ENCOUNTER — Ambulatory Visit (HOSPITAL_COMMUNITY): Payer: 59

## 2020-10-17 ENCOUNTER — Other Ambulatory Visit: Payer: Self-pay | Admitting: Pulmonary Disease

## 2020-10-17 MED ORDER — MOMETASONE FUROATE 50 MCG/ACT NA SUSP
1.0000 | NASAL | 1 refills | Status: DC | PRN
Start: 2020-10-17 — End: 2020-10-17

## 2020-10-17 MED ORDER — AZELASTINE HCL 0.1 % NA SOLN
2.0000 | Freq: Two times a day (BID) | NASAL | 11 refills | Status: DC
Start: 2020-10-17 — End: 2021-01-09

## 2020-10-17 NOTE — Addendum Note (Signed)
Addended by: Maurene Capes on: 10/17/2020 09:27 AM   Modules accepted: Orders

## 2020-10-18 ENCOUNTER — Ambulatory Visit (HOSPITAL_COMMUNITY): Payer: 59

## 2020-10-20 ENCOUNTER — Ambulatory Visit (HOSPITAL_COMMUNITY): Payer: 59

## 2020-10-20 NOTE — Telephone Encounter (Signed)
Dr. Dewald, please see mychart message sent by pt and advise. 

## 2020-10-21 NOTE — Telephone Encounter (Signed)
Dr. Erin [name] patient states she needs a letter from our office every 30days. I was going to send the one that was sent in December, wanted to check to see if anything has changed :  Letter would say : To Whom it May Concern,    Mrs. Jillian [name] has been under my care at [facility]. She is unable to do her job due to chronic pulmonary disease and cannot return to work until further notice.    If you have any questions or concerns, please don't hesitate to call.    Sincerely,      Freda Jackson, MD   Please advise on any changes.

## 2020-10-25 ENCOUNTER — Encounter: Payer: Self-pay | Admitting: Pulmonary Disease

## 2020-10-25 ENCOUNTER — Encounter: Payer: Self-pay | Admitting: *Deleted

## 2020-10-25 NOTE — Progress Notes (Signed)
error 

## 2020-11-09 NOTE — Telephone Encounter (Signed)
Called patient. She stated that she had a received a message that her split night sleep study was scheduled for 11/21/20 at 8pm. She saw that the test was for 4 hours. She wanted to make sure this was the correct time limit for the test. I did advise her that a info packet was mailed to her.   PCCs, can one of you all please advise? Thanks!

## 2020-11-10 NOTE — Telephone Encounter (Signed)
The test is correct - don't know why time shows up like that on appt desk.  Pt needs to check in at 8 pm & they will keep pt until 5:30-6:00 the next morning.  Will forward message back to triage so they can make pt aware thru [place].

## 2020-11-21 ENCOUNTER — Ambulatory Visit: Payer: 59 | Attending: Pulmonary Disease | Admitting: Pulmonary Disease

## 2020-11-21 ENCOUNTER — Other Ambulatory Visit: Payer: Self-pay

## 2020-11-21 DIAGNOSIS — I493 Ventricular premature depolarization: Secondary | ICD-10-CM | POA: Insufficient documentation

## 2020-11-21 DIAGNOSIS — G4733 Obstructive sleep apnea (adult) (pediatric): Secondary | ICD-10-CM | POA: Insufficient documentation

## 2020-11-23 NOTE — Telephone Encounter (Signed)
Please advise on patient mychart message  Hi. 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 It must have Dr. Lisabeth Pick signature. If you could mail this to me it would be greatly appreciated.  Thank you

## 2020-12-01 ENCOUNTER — Telehealth: Payer: Self-pay | Admitting: Pulmonary Disease

## 2020-12-01 NOTE — Procedures (Signed)
    Patient Name: Jillian [name], Jillian [name] Date: 11/21/2020 Gender: Female D.O.B: 03/12/1966 Age (years): 54 Referring Provider: Freda Jackson Height (inches): 33 Interpreting Physician: Chesley Mires MD, ABSM Weight (lbs): 180 RPSGT: Rosebud Poles BMI: 33 MRN: [id number] Neck Size: 17.50  CLINICAL INFORMATION Sleep Study Type: NPSG  Indication for sleep study: Snoring, sleep disruption, and daytime sleepiness.  Epworth Sleepiness Score: 14  SLEEP STUDY TECHNIQUE As per the AASM Manual for the Scoring of Sleep and Associated Events v2.3 (April 2016) with a hypopnea requiring 4% desaturations.  The channels recorded and monitored were frontal, central and occipital EEG, electrooculogram (EOG), submentalis EMG (chin), nasal and oral airflow, thoracic and abdominal wall motion, anterior tibialis EMG, snore microphone, electrocardiogram, and pulse oximetry.  MEDICATIONS Medications self-administered by patient taken the night of the study : N/A  SLEEP ARCHITECTURE The study was initiated at 10:11:49 PM and ended at 5:02:42 AM.  Sleep onset time was 59.8 minutes and the sleep efficiency was 73.5%%. The total sleep time was 302.1 minutes.  Stage REM latency was 187.5 minutes.  The patient spent 4.8%% of the night in stage N1 sleep, 58.5%% in stage N2 sleep, 12.2%% in stage N3 and 24.5% in REM.  Alpha intrusion was absent.  Supine sleep was 62.75%.  RESPIRATORY PARAMETERS The overall apnea/hypopnea index (AHI) was 51.0 per hour. There were 46 total apneas, including 45 obstructive, 1 central and 0 mixed apneas. There were 211 hypopneas and 0 RERAs.  The AHI during Stage REM sleep was 120.0 per hour.  AHI while supine was 80.1 per hour.  The mean oxygen saturation was 89.0%. The minimum SpO2 during sleep was 74.0%.  moderate snoring was noted during this study.  CARDIAC DATA The 2 lead EKG demonstrated sinus rhythm. The mean heart rate was 83.5 beats per minute. Other  EKG findings include: PVCs.  LEG MOVEMENT DATA The total PLMS were 0 with a resulting PLMS index of 0.0. Associated arousal with leg movement index was 8.5 .  IMPRESSIONS - Severe obstructive sleep apnea occurred during this study (AHI = 51.0/h).  Significant REM and supine effect with REM AHI of 120/h and supine AHI 80.1/h.   - No significant central sleep apnea occurred during this study (CAI = 0.2/h). - Moderate oxygen desaturation was noted during this study (Min O2 = 74.0%). - The patient snored with moderate snoring volume. - EKG findings include PVCs. - Clinically significant periodic limb movements did not occur during sleep. Associated arousals were significant.  DIAGNOSIS - Obstructive Sleep Apnea (G47.33)  RECOMMENDATIONS - Given severity of her sleep apnea, CPAP therapy would likely best option to begin with. - Alternative therapies include oral appliance, or surgical assessment. - Positional therapy avoiding supine position during sleep. - Avoid alcohol, sedatives and other CNS depressants that may worsen sleep apnea and disrupt normal sleep architecture. - Sleep hygiene should be reviewed to assess factors that may improve sleep quality. - Weight management and regular exercise should be initiated or continued if appropriate.  [Electronically signed] 12/01/2020 01:55 PM  Chesley Mires MD, [place], American Board of Sleep Medicine   NPI: [id number]

## 2020-12-01 NOTE — Telephone Encounter (Signed)
Please let patient know she has severe obstructive sleep apnea and would benefit from CPAP therapy. If she is open to starting this therapy, please schedule her with Dr. Halford Chessman who read her sleep study.  Thanks, Wille Glaser

## 2020-12-12 NOTE — Telephone Encounter (Signed)
Please advise on patient mychart message and if you are ok with [name] placing referral    Hi I contacted pulmonary rehab today and they said I would need another referral. I got bumped my last time due to having shingles so I was wondering if Dr Erin Fulling could send that in for me? Thank you

## 2020-12-13 ENCOUNTER — Other Ambulatory Visit: Payer: Self-pay | Admitting: *Deleted

## 2020-12-13 DIAGNOSIS — J449 Chronic obstructive pulmonary disease, unspecified: Secondary | ICD-10-CM

## 2020-12-16 MED ORDER — AZITHROMYCIN 250 MG PO TABS: ORAL_TABLET | ORAL | 0 refills | Status: DC

## 2020-12-16 MED ORDER — PREDNISONE 20 MG PO TABS: 40.0000 mg | ORAL_TABLET | Freq: Every day | ORAL | 0 refills | Status: AC

## 2020-12-16 NOTE — Telephone Encounter (Signed)
Dr. Erin Fulling, Please see patient comments and advise.  Patient is requesting a z-pack.  Thank you.

## 2020-12-20 ENCOUNTER — Encounter: Payer: Self-pay | Admitting: *Deleted

## 2020-12-20 NOTE — Telephone Encounter (Signed)
Patient sent email ask for a letter. Looks like we have written it the past two months, may we write the same letter again for March and possibly April  Hi. 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 Basically saying the same thing as last month. I will probably need one for next month also. After being out  366 days I don't think they will need a note anymore. Thanks. Have a great day.   Will send to Dr. Erin Fulling for further recommendations

## 2020-12-27 NOTE — Telephone Encounter (Signed)
We received this mychart messages. Please advise.  Hi. Im not feeling well at all after taking the azithromizine and prednisone.  My chest is heavy, upper back hurting, cough with and without mucus, no energy and sleeping most of the time. All been going on for the past 3 days. Mucinex is not helping this time. Please help.  No covid test, I haven't left the house all month  mucus is still yellowish brown.  Temp 97.1

## 2020-12-27 NOTE — Telephone Encounter (Signed)
12/27/2020  Schedule an office visit with Dr. Erin Fulling or APP for physical evaluation in office.  If patient is scheduled with APP please also schedule a follow-up with Dr. Erin Fulling in 6 to 8 weeks.  Wyn Quaker, FNP

## 2020-12-27 NOTE — Telephone Encounter (Signed)
Called and spoke with patient and scheduled acute office visit with Dr Vaughan Browner at [place] at the [facility] office for tomorrow 12/28/2020. Patient agreeable to time, date and location. Nothing further needed at this time.

## 2020-12-28 ENCOUNTER — Other Ambulatory Visit: Payer: Self-pay

## 2020-12-28 ENCOUNTER — Encounter: Payer: Self-pay | Admitting: Pulmonary Disease

## 2020-12-28 ENCOUNTER — Ambulatory Visit (INDEPENDENT_AMBULATORY_CARE_PROVIDER_SITE_OTHER): Payer: 59

## 2020-12-28 ENCOUNTER — Ambulatory Visit (INDEPENDENT_AMBULATORY_CARE_PROVIDER_SITE_OTHER): Payer: 59 | Admitting: Pulmonary Disease

## 2020-12-28 VITALS — BP 116/72 | HR 102 | Temp 97.7°F | Ht 62.0 in | Wt 182.6 lb

## 2020-12-28 DIAGNOSIS — J449 Chronic obstructive pulmonary disease, unspecified: Secondary | ICD-10-CM

## 2020-12-28 DIAGNOSIS — J441 Chronic obstructive pulmonary disease with (acute) exacerbation: Secondary | ICD-10-CM

## 2020-12-28 MED ORDER — PREDNISONE 10 MG PO TABS: ORAL_TABLET | ORAL | 0 refills | Status: DC

## 2020-12-28 NOTE — Progress Notes (Signed)
Jillian [name]    [id number]    03-03-1966  Primary Care Physician:Hasanaj, Samul Dada, MD  Referring Physician: Neale Burly, MD [address] [place],  [place] [address]  Chief complaint:   Acute visit for COPD exacerbation  HPI: 55 year old with severe COPD, active smoker.  She is a patient of Dr. Erin Fulling Has multiple COPD exacerbations over the past year Recently prescribed prednisone 40 mg x 5 days and Z-Pak on 3/4 for another exacerbation of COPD.  She continues to have dyspnea on exertion, cough with yellow sputum, wheezing, chest tightness.  Denies any fevers, chills  Outpatient Encounter Medications as of 12/28/2020  Medication Sig  . albuterol (VENTOLIN HFA) 108 (90 Base) MCG/ACT inhaler Inhale 2 puffs into the lungs every 4 (four) hours as needed for wheezing or shortness of breath.  . Aspirin-Acetaminophen-Caffeine (GOODY HEADACHE PO) Take by mouth as needed.  Marland Kitchen azelastine (ASTELIN) 0.1 % nasal spray Place 2 sprays into both nostrils 2 (two) times daily. Use in each nostril as directed  . Budeson-Glycopyrrol-Formoterol (BREZTRI AEROSPHERE) 160-9-4.8 MCG/ACT AERO Inhale 2 puffs into the lungs in the morning and at bedtime.  . DULoxetine (CYMBALTA) 60 MG capsule Take 60 mg by mouth 2 (two) times daily.   Marland Kitchen ELDERBERRY PO Take 2 capsules by mouth daily.  . flunisolide (NASALIDE) 25 MCG/ACT (0.025%) SOLN Place 2 sprays into the nose 2 (two) times daily.  Marland Kitchen gabapentin (NEURONTIN) 300 MG capsule Take 300 mg by mouth 3 (three) times daily.  Marland Kitchen ipratropium-albuterol (DUONEB) 0.5-2.5 (3) MG/3ML SOLN Take 3 mLs by nebulization every 4 (four) hours as needed.  . loratadine (CLARITIN) 10 MG tablet Take 10 mg by mouth daily.  Marland Kitchen lubiprostone (AMITIZA) 24 MCG capsule TAKE ONE CAPSULE ONCE TO TWICE DAILY WITH FOOD FOR CONSTIPATION  . methocarbamol (ROBAXIN) 500 MG tablet Take 500 mg by mouth 2 (two) times daily.  . pantoprazole (PROTONIX) 40 MG tablet Take 1 tablet by mouth 2 (two)  times daily before a meal.   . Sodium Bicarbonate-Citric Acid (ALKA-SELTZER HEARTBURN PO) Take by mouth daily.  . valsartan-hydrochlorothiazide (DIOVAN-HCT) 160-12.5 MG tablet Take 1 tablet by mouth daily.  . vitamin B-12 (CYANOCOBALAMIN) 1000 MCG tablet Take 1,000 mcg by mouth daily.  . Vitamin D, Ergocalciferol, (DRISDOL) 1.25 MG (50000 UT) CAPS capsule Take 1 capsule by mouth once a week.  . [DISCONTINUED] azithromycin (ZITHROMAX) 250 MG tablet Take 2 tablets today and then 1 tablet daily until gone.   No facility-administered encounter medications on file as of 12/28/2020.    Allergies as of 12/28/2020 - Review Complete 12/28/2020  Allergen Reaction Noted  . Doxycycline Photosensitivity 08/23/2016  . Banana Hives 07/10/2019    Past Medical History:  Diagnosis Date  . Anxiety   . Arthritis   . Asthma   . Depression   . High blood pressure   . History of bladder problems   . Migraines   . Sleep apnea     Past Surgical History:  Procedure Laterality Date  . APPENDECTOMY    . BLADDER SURGERY     x3  . COLONOSCOPY  06/2018   Dr. Therisa Doyne: Normal terminal ileum with normal biopsies.  Evidence of prior end to side ileocolonic anastomosis in the cecum.  This was patent and was characterized by healthy-appearing mucosa.  Multiple small and large mouth diverticula noted in the sigmoid colon and descending colon.  Random colon biopsies were benign.  Recommended colonoscopy in 10  years.  . ileum resection  1982  . LAPAROSCOPIC CHOLECYSTECTOMY    . PARTIAL HYSTERECTOMY      Family History  Problem Relation Age of Onset  . Diabetes Mother   . COPD Mother   . Heart attack Father   . Diabetes Sister   . Colon cancer Neg Hx   . Celiac disease Neg Hx   . Inflammatory bowel disease Neg Hx     Social History   Socioeconomic History  . Marital status: Divorced    Spouse name: Not on file  . Number of children: Not on file  . Years of education: Not on file  . Highest education  level: Not on file  Occupational History  . Not on file  Tobacco Use  . Smoking status: Current Every Day Smoker    Packs/day: 1.00    Years: 55.00    Pack years: 55.00    Types: Cigarettes  . Smokeless tobacco: Never Used  . Tobacco comment: 5-10 cigs per day/12/28/20  Vaping Use  . Vaping Use: Never used  Substance and Sexual Activity  . Alcohol use: Yes    Comment: hardly ever  . Drug use: No  . Sexual activity: Not on file  Other Topics Concern  . Not on file  Social History Narrative  . Not on file   Social Determinants of Health   Financial Resource Strain: Not on file  Food Insecurity: Not on file  Transportation Needs: Not on file  Physical Activity: Not on file  Stress: Not on file  Social Connections: Not on file  Intimate Partner Violence: Not on file    Review of systems: Review of Systems  Constitutional: Negative for fever and chills.  HENT: Negative.   Eyes: Negative for blurred vision.  Respiratory: as per HPI  Cardiovascular: Negative for chest pain and palpitations.  Gastrointestinal: Negative for vomiting, diarrhea, blood per rectum. Genitourinary: Negative for dysuria, urgency, frequency and hematuria.  Musculoskeletal: Negative for myalgias, back pain and joint pain.  Skin: Negative for itching and rash.  Neurological: Negative for dizziness, tremors, focal weakness, seizures and loss of consciousness.  Endo/Heme/Allergies: Negative for environmental allergies.  Psychiatric/Behavioral: Negative for depression, suicidal ideas and hallucinations.  All other systems reviewed and are negative.  Physical Exam: Blood pressure 116/72, pulse (!) 102, temperature 97.7 F (36.5 C), temperature source Temporal, height 5\' 2"  (1.575 m), weight 182 lb 9.6 oz (82.8 kg), SpO2 97 %. Gen:      No acute distress HEENT:  EOMI, sclera anicteric Neck:     No masses; no thyromegaly Lungs:    Clear to auscultation bilaterally; normal respiratory effort CV:          Regular rate and rhythm; no murmurs Abd:      + bowel sounds; soft, non-tender; no palpable masses, no distension Ext:    No edema; adequate peripheral perfusion Skin:      Warm and dry; no rash Neuro: alert and oriented x 3 Psych: normal mood and affect  Data Reviewed: Imaging: CT chest 09/19/2020-centrilobular nodules, subcentimeter pulmonary nodules Emphysema.  I have reviewed the images personally.  PFTs: 01/18/2020 FVC 2.04 (63%), FEV1 1.43 [56%], F/F 70, DLCO 13.53 [69%] Severe obstruction, mild diffusion defect  Labs:  Assessment:  Severe COPD with exacerbation Active smoker She has already received a course of antibiotics and prednisone but continues to have symptoms.  I do not believe she will require additional antibiotics unless chest x-ray shows an infiltrate Give longer course  of steroids.  Start prednisone 60 mg a day and reduce by 10 mg every 2 days Continue breztri, nebs Smoking cessation advised  Plan/Recommendations: Chest x-ray Prednisone 60 mg a day.  Reduce by 10 mg every 2 days  Marshell Garfinkel MD [facility] 12/28/2020, 11:14 AM  CC: Neale Burly, MD

## 2020-12-28 NOTE — Patient Instructions (Signed)
I am sorry not feeling well Continue inhaler and nebs We will get a chest x-ray today Start prednisone at 60 mg.  Reduce by 10 mg every 2 days  Follow-up with Dr. Erin Fulling

## 2021-01-09 ENCOUNTER — Encounter: Payer: Self-pay | Admitting: Pulmonary Disease

## 2021-01-09 ENCOUNTER — Ambulatory Visit (INDEPENDENT_AMBULATORY_CARE_PROVIDER_SITE_OTHER): Payer: 59 | Admitting: Pulmonary Disease

## 2021-01-09 ENCOUNTER — Other Ambulatory Visit: Payer: Self-pay

## 2021-01-09 VITALS — BP 130/62 | HR 95 | Temp 97.6°F | Ht 62.0 in | Wt 187.0 lb

## 2021-01-09 DIAGNOSIS — J449 Chronic obstructive pulmonary disease, unspecified: Secondary | ICD-10-CM | POA: Diagnosis not present

## 2021-01-09 DIAGNOSIS — G4733 Obstructive sleep apnea (adult) (pediatric): Secondary | ICD-10-CM | POA: Diagnosis not present

## 2021-01-09 DIAGNOSIS — J9611 Chronic respiratory failure with hypoxia: Secondary | ICD-10-CM

## 2021-01-09 NOTE — Patient Instructions (Signed)
Will arrange for CPAP titration study  Follow up in 6 months

## 2021-01-09 NOTE — Progress Notes (Signed)
[facility]  Chief Complaint  Patient presents with  . Follow-up    Needs to see sleep doctor--has not been using cpap for several years due to mask and hose issues    Constitutional:  BP 130/62 (BP Location: Left Arm, Cuff Size: Normal)   Pulse 95   Temp 97.6 F (36.4 C) (Other (Comment)) Comment (Src): wrist  Ht 5\' 2"  (1.575 m)   Wt 187 lb (84.8 kg)   SpO2 96% Comment: Room air  BMI 34.20 kg/m   Past Medical History:  Anxiety, OA, Asthma, Depression, HTN, Migraine HA  Past Surgical History:  She  has a past surgical history that includes Appendectomy; Laparoscopic cholecystectomy; Partial hysterectomy; Bladder surgery; ileum resection (5993); and Colonoscopy (06/2018).  Brief Summary:  Jillian [name] is a 55 y.o. female smoker with obstructive sleep apnea and COPD with chronic hypoxic respiratory failure.      Subjective:   Followed by Dr. Erin Fulling.  She has history of sleep apnea, but hasn't used CPAP since 2016.  She was having trouble with mask fit.  Continues to have snoring, apnea, sleep disruption and daytime sleepiness.  Sleep study from February showed severe sleep apnea.  Breathing is okay. Not currently having cough, sputum, or chest congestion.   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 She has a 55.00 pack-year smoking history. She has never used smokeless tobacco. She reports current alcohol use. She reports that she does not use drugs.  Family History:  Her family history includes COPD in her mother; Diabetes in her mother and sister; Heart attack in her father.     Assessment/Plan:   Obstructive sleep apnea. - reviewed her sleep test results - discussed how untreated sleep apnea impact her health - treatment options reviewed - it is my medical opinion that she needs to have an in lab titration study prior to starting CPAP, given her history of COPD and respiratory failure  COPD with chronic bronchitis. - remains on spiriva, and prn albuterol - she is applying for disability - she plans to continue follow up with Dr. Erin Fulling  Chronic respiratory failure with hypoxia. - she uses oxygen prn  Lung nodules. - f/u with Dr. Erin Fulling  Obesity. - reviewed options to help with weight loss  Tobacco abuse. - discussed options to help with smoking cessation  Time Spent Involved in Patient Care on Day of Examination:  32 minutes  Follow up:  Patient Instructions  Will arrange for CPAP titration study  Follow up in 6 months   Medication List:   Allergies as of 01/09/2021  Reactions   Doxycycline Photosensitivity   Broke out with blisters after being in the sun   Banana Hives      Medication List       Accurate as of January 09, 2021 11:42 AM. If you have any questions, ask your nurse or doctor.        STOP taking these medications   azelastine 0.1 % nasal spray Commonly known as: ASTELIN Stopped by: Chesley Mires, MD   Breztri Aerosphere 160-9-4.8 MCG/ACT Aero Generic drug: Budeson-Glycopyrrol-Formoterol Stopped by: Chesley Mires, MD   methocarbamol 500 MG tablet Commonly known as: ROBAXIN Stopped by: Chesley Mires, MD     TAKE  these medications   albuterol 108 (90 Base) MCG/ACT inhaler Commonly known as: VENTOLIN HFA Inhale 2 puffs into the lungs every 4 (four) hours as needed for wheezing or shortness of breath.   ALKA-SELTZER HEARTBURN PO Take by mouth daily.   DULoxetine 60 MG capsule Commonly known as: CYMBALTA Take 60 mg by mouth 2 (two) times daily.   ELDERBERRY PO Take 2 capsules by mouth daily.   flunisolide 25 MCG/ACT (0.025%) Soln Commonly known as: NASALIDE Place 2 sprays into the nose 2 (two) times daily.   gabapentin 300 MG capsule Commonly known as: NEURONTIN Take 300 mg by mouth 3 (three) times daily.   GOODY HEADACHE PO Take by mouth as needed.   ipratropium-albuterol 0.5-2.5 (3) MG/3ML Soln Commonly known as: DUONEB Take 3 mLs by nebulization every 4 (four) hours as needed.   loratadine 10 MG tablet Commonly known as: CLARITIN Take 10 mg by mouth daily.   lubiprostone 24 MCG capsule Commonly known as: AMITIZA TAKE ONE CAPSULE ONCE TO TWICE DAILY WITH FOOD FOR CONSTIPATION   mometasone 50 MCG/ACT nasal spray Commonly known as: NASONEX 2 sprays daily.   pantoprazole 40 MG tablet Commonly known as: PROTONIX Take 1 tablet by mouth 2 (two) times daily before a meal.   predniSONE 10 MG tablet Commonly known as: DELTASONE Take 60 mg daily reduce 10mg  every 2 days until gone   Spiriva Respimat 2.5 MCG/ACT Aers Generic drug: Tiotropium Bromide Monohydrate 2 puffs daily.   valsartan-hydrochlorothiazide 160-12.5 MG tablet Commonly known as: DIOVAN-HCT Take 1 tablet by mouth daily.   vitamin B-12 1000 MCG tablet Commonly known as: CYANOCOBALAMIN Take 1,000 mcg by mouth daily.   Vitamin D (Ergocalciferol) 1.25 MG (50000 UNIT) Caps capsule Commonly known as: DRISDOL Take 1 capsule by mouth once a week.       Signature:  Chesley Mires, MD [place] Pager - [phone] 01/09/2021, 11:42 AM

## 2021-01-12 ENCOUNTER — Other Ambulatory Visit: Payer: Self-pay

## 2021-01-12 ENCOUNTER — Encounter (HOSPITAL_COMMUNITY): Payer: Self-pay

## 2021-01-12 ENCOUNTER — Encounter (HOSPITAL_COMMUNITY)
Admission: RE | Admit: 2021-01-12 | Discharge: 2021-01-12 | Disposition: A | Discharge status: Home / Self Care | Payer: 59 | Source: Ambulatory Visit | Attending: Pulmonary Disease | Admitting: Pulmonary Disease

## 2021-01-12 VITALS — BP 106/66 | HR 85 | Ht 62.0 in | Wt 183.9 lb

## 2021-01-12 DIAGNOSIS — J449 Chronic obstructive pulmonary disease, unspecified: Secondary | ICD-10-CM | POA: Insufficient documentation

## 2021-01-12 NOTE — Progress Notes (Signed)
Cardiac/Pulmonary Rehab Medication Review by a Pharmacist  Does the patient  feel that his/her medications are working for him/her?  yes  Has the patient been experiencing any side effects to the medications prescribed?  no  Does the patient measure his/her own blood pressure or blood glucose at home?  BP normal    Does the patient have any problems obtaining medications due to transportation or finances?   no  Understanding of regimen: good Understanding of indications: good Potential of compliance: good  Questions asked to Determine Patient Understanding of Medication Regimen:  1. What is the name of the medication?  2. What is the medication used for?  3. When should it be taken?  4. How much should be taken?  5. How will you take it?  6. What side effects should you report?  Understanding Defined as: Excellent: All questions above are correct Good: Questions 1-4 are correct Fair: Questions 1-2 are correct  Poor: 1 or none of the above questions are correct   Pharmacist comments:     Ramond Craver 01/12/2021 1:43 PM

## 2021-01-12 NOTE — Progress Notes (Signed)
Pulmonary Individual Treatment Plan  Patient Details  Name: Jillian [name] MRN: [id number] Date of Birth: 09-29-1966 Referring Provider:   Flowsheet Row PULMONARY REHAB COPD ORIENTATION from 01/12/2021 in [place]  Referring Provider Dr. Erin Fulling      Initial Encounter Date:  Flowsheet Row PULMONARY REHAB COPD ORIENTATION from 01/12/2021 in [place]  Date 01/12/21      Visit Diagnosis: Chronic obstructive pulmonary disease, unspecified COPD type ([place])  Patient's Home Medications on Admission:   Current Outpatient Medications:  .  fluticasone-salmeterol (ADVAIR HFA) 115-21 MCG/ACT inhaler, Inhale 2 puffs into the lungs 2 (two) times daily., Disp: , Rfl:  .  Aspirin-Acetaminophen-Caffeine (GOODY HEADACHE PO), Take 1 tablet by mouth as needed., Disp: , Rfl:  .  DULoxetine (CYMBALTA) 60 MG capsule, Take 60 mg by mouth 2 (two) times daily. , Disp: , Rfl:  .  gabapentin (NEURONTIN) 300 MG capsule, Take 300 mg by mouth 3 (three) times daily as needed., Disp: , Rfl:  .  ipratropium-albuterol (DUONEB) 0.5-2.5 (3) MG/3ML SOLN, Take 3 mLs by nebulization every 4 (four) hours as needed., Disp: 360 mL, Rfl: 3 .  loratadine (CLARITIN) 10 MG tablet, Take 10 mg by mouth daily., Disp: , Rfl:  .  mometasone (NASONEX) 50 MCG/ACT nasal spray, Place 2 sprays into the nose daily., Disp: , Rfl:  .  pantoprazole (PROTONIX) 40 MG tablet, Take 1 tablet by mouth 2 (two) times daily before a meal. , Disp: , Rfl:  .  Sodium Bicarbonate-Citric Acid (ALKA-SELTZER HEARTBURN PO), Take 1 tablet by mouth as needed (heartburn)., Disp: , Rfl:  .  SPIRIVA RESPIMAT 2.5 MCG/ACT AERS, 2 puffs daily., Disp: , Rfl:  .  valsartan-hydrochlorothiazide (DIOVAN-HCT) 160-12.5 MG tablet, Take 1 tablet by mouth daily., Disp: , Rfl:  .  vitamin B-12 (CYANOCOBALAMIN) 1000 MCG tablet, Take 1,000 mcg by mouth daily., Disp: , Rfl:  .  Vitamin D, Ergocalciferol, (DRISDOL) 1.25 MG (50000 UT) CAPS  capsule, Take 1 capsule by mouth once a week. tuesdays, Disp: , Rfl:   Past Medical History: Past Medical History:  Diagnosis Date  . Anxiety   . Arthritis   . Asthma   . Depression   . High blood pressure   . History of bladder problems   . Migraines   . Sleep apnea     Tobacco Use: Social History   Tobacco Use  Smoking Status Current Every Day Smoker  . Packs/day: 1.00  . Years: 55.00  . Pack years: 55.00  . Types: Cigarettes  Smokeless Tobacco Never Used  Tobacco Comment   smokes 1 pack per day 01/09/2021    Labs: Recent Review Flowsheet Data   There is no flowsheet data to display.     Capillary Blood Glucose: No results found for: GLUCAP   Pulmonary Assessment Scores:  Pulmonary Assessment Scores    Row Name 01/12/21 1301         ADL UCSD   ADL Phase Entry     SOB Score total 99           CAT Score   CAT Score 40           mMRC Score   mMRC Score 4           UCSD: Self-administered rating of dyspnea associated with activities of daily living (ADLs) 6-point scale (0 = "not at all" to 5 = "maximal or unable to do because of breathlessness")  Scoring Scores range from 0  to 120.  Minimally important difference is 5 units  CAT: CAT can identify the health impairment of COPD patients and is better correlated with disease progression.  CAT has a scoring range of zero to 40. The CAT score is classified into four groups of low (less than 10), medium (10 - 20), high (21-30) and very high (31-40) based on the impact level of disease on health status. A CAT score over 10 suggests significant symptoms.  A worsening CAT score could be explained by an exacerbation, poor medication adherence, poor inhaler technique, or progression of COPD or comorbid conditions.  CAT MCID is 2 points  mMRC: mMRC (Modified Medical Research Council) Dyspnea Scale is used to assess the degree of baseline functional disability in patients of respiratory disease due to  dyspnea. No minimal important difference is established. A decrease in score of 1 point or greater is considered a positive change.   Pulmonary Function Assessment:   Exercise Target Goals: Exercise Program Goal: Individual exercise prescription set using results from initial 6 min walk test and THRR while considering  patient's activity barriers and safety.   Exercise Prescription Goal: Initial exercise prescription builds to 30-45 minutes a day of aerobic activity, 2-3 days per week.  Home exercise guidelines will be given to patient during program as part of exercise prescription that the participant will acknowledge.  Activity Barriers & Risk Stratification:  Activity Barriers & Cardiac Risk Stratification - 01/12/21 1302      Activity Barriers & Cardiac Risk Stratification   Activity Barriers Arthritis;Fibromyalgia;Neck/Spine Problems;Deconditioning;Muscular Weakness;Shortness of Breath    Cardiac Risk Stratification Low           6 Minute Walk:  6 Minute Walk    Row Name 01/12/21 1442         6 Minute Walk   Phase Initial     Distance 1000 feet     Walk Time 6 minutes     # of Rest Breaks 1     MPH 1.89     METS 3.11     RPE 12     Perceived Dyspnea  19     VO2 Peak 10.88     Resting HR 85 bpm     Resting BP 106/66     Resting Oxygen Saturation  97 %     Exercise Oxygen Saturation  during 6 min walk 91 %     Max Ex. HR 116 bpm     Max Ex. BP 168/50     2 Minute Post BP 154/60           Interval HR   1 Minute HR 108     2 Minute HR 111     3 Minute HR 109     4 Minute HR 109     5 Minute HR 115     6 Minute HR 116     2 Minute Post HR 100     Interval Heart Rate? Yes           Interval Oxygen   Interval Oxygen? Yes     Baseline Oxygen Saturation % 97 %     1 Minute Oxygen Saturation % 95 %     1 Minute Liters of Oxygen 3 L     2 Minute Oxygen Saturation % 94 %     2 Minute Liters of Oxygen 3 L     3 Minute Oxygen Saturation % 95 %     3 Minute  Liters of Oxygen 3 L     4 Minute Oxygen Saturation % 95 %     4 Minute Liters of Oxygen 3 L     5 Minute Oxygen Saturation % 91 %     5 Minute Liters of Oxygen 3 L     6 Minute Oxygen Saturation % 94 %     6 Minute Liters of Oxygen 3 L     2 Minute Post Oxygen Saturation % 97 %     2 Minute Post Liters of Oxygen 3 L            Oxygen Initial Assessment:  Oxygen Initial Assessment - 01/12/21 1441      Initial 6 min Walk   Oxygen Used Continuous    Liters per minute 3      Program Oxygen Prescription   Program Oxygen Prescription Continuous    Liters per minute 3      Intervention   Short Term Goals To learn and exhibit compliance with exercise, home and travel O2 prescription;To learn and understand importance of monitoring SPO2 with pulse oximeter and demonstrate accurate use of the pulse oximeter.;To learn and understand importance of maintaining oxygen saturations>88%;To learn and demonstrate proper pursed lip breathing techniques or other breathing techniques.;To learn and demonstrate proper use of respiratory medications    Long  Term Goals Exhibits compliance with exercise, home and travel O2 prescription;Verbalizes importance of monitoring SPO2 with pulse oximeter and return demonstration;Maintenance of O2 saturations>88%;Exhibits proper breathing techniques, such as pursed lip breathing or other method taught during program session;Compliance with respiratory medication;Demonstrates proper use of MDI's           Oxygen Re-Evaluation:   Oxygen Discharge (Final Oxygen Re-Evaluation):   Initial Exercise Prescription:  Initial Exercise Prescription - 01/12/21 1400      Date of Initial Exercise RX and Referring Provider   Date 01/12/21    Referring Provider Dr. Erin Fulling    Expected Discharge Date 05/18/21      Oxygen   Oxygen Continuous    Liters 3      NuStep   Level 1    SPM 60    Minutes 39      Prescription Details   Frequency (times per week) 2     Duration Progress to 30 minutes of continuous aerobic without signs/symptoms of physical distress      Intensity   THRR 40-80% of Max Heartrate 65-130    Ratings of Perceived Exertion 11-13    Perceived Dyspnea 0-4      Resistance Training   Training Prescription Yes    Weight 2 lbs    Reps 10-15           Perform Capillary Blood Glucose checks as needed.  Exercise Prescription Changes:   Exercise Comments:   Exercise Goals and Review:  Exercise Goals    Row Name 01/12/21 1446             Exercise Goals   Increase Physical Activity Yes       Intervention Provide advice, education, support and counseling about physical activity/exercise needs.;Develop an individualized exercise prescription for aerobic and resistive training based on initial evaluation findings, risk stratification, comorbidities and participant's personal goals.       Expected Outcomes Short Term: Attend rehab on a regular basis to increase amount of physical activity.;Long Term: Add in home exercise to make exercise part of routine and to increase amount of physical activity.;Long Term: Exercising regularly at  least 3-5 days a week.       Increase Strength and Stamina Yes       Intervention Provide advice, education, support and counseling about physical activity/exercise needs.;Develop an individualized exercise prescription for aerobic and resistive training based on initial evaluation findings, risk stratification, comorbidities and participant's personal goals.       Expected Outcomes Short Term: Increase workloads from initial exercise prescription for resistance, speed, and METs.;Short Term: Perform resistance training exercises routinely during rehab and add in resistance training at home;Long Term: Improve cardiorespiratory fitness, muscular endurance and strength as measured by increased METs and functional capacity (6MWT)       Able to understand and use rate of perceived exertion (RPE) scale Yes        Intervention Provide education and explanation on how to use RPE scale       Expected Outcomes Short Term: Able to use RPE daily in rehab to express subjective intensity level;Long Term:  Able to use RPE to guide intensity level when exercising independently       Able to understand and use Dyspnea scale Yes       Intervention Provide education and explanation on how to use Dyspnea scale       Expected Outcomes Short Term: Able to use Dyspnea scale daily in rehab to express subjective sense of shortness of breath during exertion;Long Term: Able to use Dyspnea scale to guide intensity level when exercising independently       Knowledge and understanding of Target Heart Rate Range (THRR) Yes       Intervention Provide education and explanation of THRR including how the numbers were predicted and where they are located for reference       Expected Outcomes Short Term: Able to state/look up THRR;Long Term: Able to use THRR to govern intensity when exercising independently;Short Term: Able to use daily as guideline for intensity in rehab       Understanding of Exercise Prescription Yes       Intervention Provide education, explanation, and written materials on patient's individual exercise prescription       Expected Outcomes Short Term: Able to explain program exercise prescription;Long Term: Able to explain home exercise prescription to exercise independently              Exercise Goals Re-Evaluation :   Discharge Exercise Prescription (Final Exercise Prescription Changes):   Nutrition:  Target Goals: Understanding of nutrition guidelines, daily intake of sodium 1500mg , cholesterol 200mg , calories 30% from fat and 7% or less from saturated fats, daily to have 5 or more servings of fruits and vegetables.  Biometrics:  Pre Biometrics - 01/12/21 1446      Pre Biometrics   Height 5\' 2"  (1.575 m)    Weight 183 lb 13.8 oz (83.4 kg)    Waist Circumference 47 inches    Hip Circumference 47  inches    Waist to Hip Ratio 1 %    BMI (Calculated) 33.62    Triceps Skinfold 17 mm    % Body Fat 43.5 %    Grip Strength 25.7 kg    Flexibility 0 in    Single Leg Stand 21.48 seconds            Nutrition Therapy Plan and Nutrition Goals:   Nutrition Assessments:  Nutrition Assessments - 01/12/21 1306      MEDFICTS Scores   Pre Score 29          MEDIFICTS Score Key:  ?70  Need to make dietary changes   40-70 Heart Healthy Diet  ? 40 Therapeutic Level Cholesterol Diet   Picture Your Plate Scores:  <81 Unhealthy dietary pattern with much room for improvement.  41-50 Dietary pattern unlikely to meet recommendations for good health and room for improvement.  51-60 More healthful dietary pattern, with some room for improvement.   >60 Healthy dietary pattern, although there may be some specific behaviors that could be improved.    Nutrition Goals Re-Evaluation:   Nutrition Goals Discharge (Final Nutrition Goals Re-Evaluation):   Psychosocial: Target Goals: Acknowledge presence or absence of significant depression and/or stress, maximize coping skills, provide positive support system. Participant is able to verbalize types and ability to use techniques and skills needed for reducing stress and depression.  Initial Review & Psychosocial Screening:  Initial Psych Review & Screening - 01/12/21 1303      Initial Review   Current issues with History of Depression;Current Stress Concerns    Source of Stress Concerns Financial;Chronic Illness;Retirement/disability;Unable to perform yard/household activities      [place]? Yes    Comments Her son and daughter in law support her mentally. She lives with them and they support her daily. She has a brother who lives in [place] who supports her financially now that she is on disability and has a limited income. She also has a daughter who supports her as well.      Barriers   Psychosocial  barriers to participate in program The patient should benefit from training in stress management and relaxation.      Screening Interventions   Interventions Encouraged to exercise    Expected Outcomes Long Term Goal: Stressors or current issues are controlled or eliminated.;Short Term goal: Identification and review with participant of any Quality of Life or Depression concerns found by scoring the questionnaire.;Long Term goal: The participant improves quality of Life and PHQ9 Scores as seen by post scores and/or verbalization of changes           Quality of Life Scores:  Quality of Life - 01/12/21 1449      Quality of Life   Select Quality of Life      Quality of Life Scores   Health/Function Pre 2.5 %    Socioeconomic Pre 15.36 %    Psych/Spiritual Pre 4.64 %    Family Pre 18.8 %    GLOBAL Pre 7.83 %          Scores of 19 and below usually indicate a poorer quality of life in these areas.  A difference of  2-3 points is a clinically meaningful difference.  A difference of 2-3 points in the total score of the Quality of Life Index has been associated with significant improvement in overall quality of life, self-image, physical symptoms, and general health in studies assessing change in quality of life.   PHQ-9: Recent Review Flowsheet Data    Depression screen [facility] 2/9 01/12/2021 08/15/2020 08/15/2020   Decreased Interest 3 0 0   Down, Depressed, Hopeless 3 0 0   PHQ - 2 Score 6 0 0   Altered sleeping 3 0 -   Tired, decreased energy 3 0 -   Change in appetite 2 0 -   Feeling bad or failure about yourself  3 0 -   Trouble concentrating 3 0 -   Moving slowly or fidgety/restless 0 0 -   Suicidal thoughts 2  0 -   PHQ-9 Score 22  0 -   Difficult doing work/chores Very difficult Not difficult at all -     Interpretation of Total Score  Total Score Depression Severity:  1-4 = Minimal depression, 5-9 = Mild depression, 10-14 = Moderate depression, 15-19 = Moderately severe  depression, 20-27 = Severe depression   Psychosocial Evaluation and Intervention:  Psychosocial Evaluation - 01/12/21 1510      Psychosocial Evaluation & Interventions   Interventions Encouraged to exercise with the program and follow exercise prescription;Stress management education    Comments Pt has no identifiable barriers to participating in pulmonary rehab. She currently rates her stress as very high due to chronic illness and finances. Her COPD has greatly limited her ability to do things that she used to do. She is worried about her finances. 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 and has been on short term disability for over a year now. She is currently in the process of applying for long term disability. She lives with her son and daughter-in-law, who also struggle with money. She is no longer able to help them out as much anymore. She does have a history of depression and her QOL (7.83) and PHQ-9 (22) could indicate depression. She declined counceling services when offered. Due to her stress she has recently increased her smoking from about 5 cigarettes per day to 15. She has been working with a smoking cessation coach who provided her with paperwork and nicotine patches. They agreed that her quit day will be 01/14/2021. She is confident that this will work. She reports that her support system is her sister, son, and daughter-in-law. Her brother who lives in [place] supports her financially. She states that he has stepped up and helped her pay her bills since she is no longer able to work. She does have a positive outlook and believes this program will help her. Her goals are to lose some weight, decrease her SOB with ADL's, and to quit smoking.    Expected Outcomes The patient will quit smoking, and her stress levels will decrease with exercise and education on coping with stress.    Continue Psychosocial Services  Follow up required by staff           Psychosocial  Re-Evaluation:   Psychosocial Discharge (Final Psychosocial Re-Evaluation):    Education: Education Goals: Education classes will be provided on a weekly basis, covering required topics. Participant will state understanding/return demonstration of topics presented.  Learning Barriers/Preferences:   Education Topics: How Lungs Work and Diseases: - Discuss the anatomy of the lungs and diseases that can affect the lungs, such as COPD.   Exercise: -Discuss the importance of exercise, FITT principles of exercise, normal and abnormal responses to exercise, and how to exercise safely.   Environmental Irritants: -Discuss types of environmental irritants and how to limit exposure to environmental irritants.   Meds/Inhalers and oxygen: - Discuss respiratory medications, definition of an inhaler and oxygen, and the proper way to use an inhaler and oxygen.   Energy Saving Techniques: - Discuss methods to conserve energy and decrease shortness of breath when performing activities of daily living.    Bronchial Hygiene / Breathing Techniques: - Discuss breathing mechanics, pursed-lip breathing technique,  proper posture, effective ways to clear airways, and other functional breathing techniques   Cleaning Equipment: - Provides group verbal and written instruction about the health risks of elevated stress, cause of high stress, and healthy ways to reduce stress.   Nutrition I: Fats: - Discuss  the types of cholesterol, what cholesterol does to the body, and how cholesterol levels can be controlled.   Nutrition II: Labels: -Discuss the different components of food labels and how to read food labels.   Respiratory Infections: - Discuss the signs and symptoms of respiratory infections, ways to prevent respiratory infections, and the importance of seeking medical treatment when having a respiratory infection.   Stress I: Signs and Symptoms: - Discuss the causes of stress, how stress  may lead to anxiety and depression, and ways to limit stress.   Stress II: Relaxation: -Discuss relaxation techniques to limit stress.   Oxygen for Home/Travel: - Discuss how to prepare for travel when on oxygen and proper ways to transport and store oxygen to ensure safety.   Knowledge Questionnaire Score:  Knowledge Questionnaire Score - 01/12/21 1309      Knowledge Questionnaire Score   Pre Score 17/18           Core Components/Risk Factors/Patient Goals at Admission:  Personal Goals and Risk Factors at Admission - 01/12/21 1309      Core Components/Risk Factors/Patient Goals on Admission    Weight Management Yes;Weight Loss    Expected Outcomes Short Term: Continue to assess and modify interventions until short term weight is achieved;Long Term: Adherence to nutrition and physical activity/exercise program aimed toward attainment of established weight goal;Weight Loss: Understanding of general recommendations for a balanced deficit meal plan, which promotes 1-2 lb weight loss per week and includes a negative energy balance of [phone] kcal/d;Understanding recommendations for meals to include 15-35% energy as protein, 25-35% energy from fat, 35-60% energy from carbohydrates, less than 200mg  of dietary cholesterol, 20-35 gm of total fiber daily;Understanding of distribution of calorie intake throughout the day with the consumption of 4-5 meals/snacks    Tobacco Cessation Yes    Number of packs per day Currently is smoking 15 cigarettes per day. She has a quit date picked out of April 2.    Intervention Assist the participant in steps to quit. Provide individualized education and counseling about committing to Tobacco Cessation, relapse prevention, and pharmacological support that can be provided by physician.;Advice worker, assist with locating and accessing local/national Quit Smoking programs, and support quit date choice.    Expected Outcomes Short Term: Will  demonstrate readiness to quit, by selecting a quit date.;Long Term: Complete abstinence from all tobacco products for at least 12 months from quit date.;Short Term: Will quit all tobacco product use, adhering to prevention of relapse plan.    Improve shortness of breath with ADL's Yes    Intervention Provide education, individualized exercise plan and daily activity instruction to help decrease symptoms of SOB with activities of daily living.    Expected Outcomes Short Term: Improve cardiorespiratory fitness to achieve a reduction of symptoms when performing ADLs;Long Term: Be able to perform more ADLs without symptoms or delay the onset of symptoms    Stress Yes    Intervention Offer individual and/or small group education and counseling on adjustment to heart disease, stress management and health-related lifestyle change. Teach and support self-help strategies.;Refer participants experiencing significant psychosocial distress to appropriate mental health specialists for further evaluation and treatment. When possible, include family members and significant others in education/counseling sessions.    Expected Outcomes Short Term: Participant demonstrates changes in health-related behavior, relaxation and other stress management skills, ability to obtain effective social support, and compliance with psychotropic medications if prescribed.;Long Term: Emotional wellbeing is indicated by absence of clinically significant psychosocial distress  or social isolation.           Core Components/Risk Factors/Patient Goals Review:    Core Components/Risk Factors/Patient Goals at Discharge (Final Review):    ITP Comments:   Comments: Patient arrived for 1st visit/orientation/education at 1230. Patient was referred to Pulmonary Rehab by Dr. Erin Fulling due to COPD, unspecified COPD type (J44.9). During orientation advised patient on arrival and appointment times what to wear, what to do before, during and after  exercise. Reviewed attendance and class policy.  Pt is scheduled to return Pulmonary Rehab on 01/17/2021 at 1045. Pt was advised to come to class 15 minutes before class starts.  Discussed RPE/Dpysnea scales. Patient participated in warm up stretches. Patient was able to complete 6 minute walk test. Patient was measured for the equipment. Discussed equipment safety with patient. Took patient pre-anthropometric measurements. Patient finished visit at 1430.

## 2021-01-15 ENCOUNTER — Other Ambulatory Visit: Payer: Self-pay | Admitting: Critical Care Medicine

## 2021-01-16 MED ORDER — ALBUTEROL SULFATE HFA 108 (90 BASE) MCG/ACT IN AERS: 2.0000 | INHALATION_SPRAY | Freq: Four times a day (QID) | RESPIRATORY_TRACT | 2 refills | Status: DC | PRN

## 2021-01-16 NOTE — Telephone Encounter (Signed)
Patient sent email asking for an albuterol inhaler, she has never had one before. Just need clarification with Dr. Erin Fulling this can be sent in for patient. Pulmonary rehab is asking and she goes tomorrow 01/17/21  Dr. Erin Fulling please clarify medication as it has not been on her medication list

## 2021-01-17 ENCOUNTER — Encounter (HOSPITAL_COMMUNITY): Payer: 59

## 2021-01-18 NOTE — Progress Notes (Signed)
Pulmonary Individual Treatment Plan  Patient Details  Name: Jillian [name] MRN: [id number] Date of Birth: 11-02-1965 Referring Provider:   Flowsheet Row PULMONARY REHAB COPD ORIENTATION from 01/12/2021 in [place]  Referring Provider Dr. Erin Fulling      Initial Encounter Date:  Flowsheet Row PULMONARY REHAB COPD ORIENTATION from 01/12/2021 in [place]  Date 01/12/21      Visit Diagnosis: Chronic obstructive pulmonary disease, unspecified COPD type ([place])  Patient's Home Medications on Admission:   Current Outpatient Medications:  .  fluticasone-salmeterol (ADVAIR HFA) 115-21 MCG/ACT inhaler, Inhale 2 puffs into the lungs 2 (two) times daily., Disp: , Rfl:  .  albuterol (VENTOLIN HFA) 108 (90 Base) MCG/ACT inhaler, Inhale 2 puffs into the lungs every 6 (six) hours as needed for wheezing or shortness of breath., Disp: 24 g, Rfl: 2 .  Aspirin-Acetaminophen-Caffeine (GOODY HEADACHE PO), Take 1 tablet by mouth as needed., Disp: , Rfl:  .  DULoxetine (CYMBALTA) 60 MG capsule, Take 60 mg by mouth 2 (two) times daily. , Disp: , Rfl:  .  gabapentin (NEURONTIN) 300 MG capsule, Take 300 mg by mouth 3 (three) times daily as needed., Disp: , Rfl:  .  ipratropium-albuterol (DUONEB) 0.5-2.5 (3) MG/3ML SOLN, Take 3 mLs by nebulization every 4 (four) hours as needed., Disp: 360 mL, Rfl: 3 .  loratadine (CLARITIN) 10 MG tablet, Take 10 mg by mouth daily., Disp: , Rfl:  .  mometasone (NASONEX) 50 MCG/ACT nasal spray, Place 2 sprays into the nose daily., Disp: , Rfl:  .  pantoprazole (PROTONIX) 40 MG tablet, Take 1 tablet by mouth 2 (two) times daily before a meal. , Disp: , Rfl:  .  Sodium Bicarbonate-Citric Acid (ALKA-SELTZER HEARTBURN PO), Take 1 tablet by mouth as needed (heartburn)., Disp: , Rfl:  .  SPIRIVA RESPIMAT 2.5 MCG/ACT AERS, 2 puffs daily., Disp: , Rfl:  .  valsartan-hydrochlorothiazide (DIOVAN-HCT) 160-12.5 MG tablet, Take 1 tablet by mouth  daily., Disp: , Rfl:  .  vitamin B-12 (CYANOCOBALAMIN) 1000 MCG tablet, Take 1,000 mcg by mouth daily., Disp: , Rfl:  .  Vitamin D, Ergocalciferol, (DRISDOL) 1.25 MG (50000 UT) CAPS capsule, Take 1 capsule by mouth once a week. tuesdays, Disp: , Rfl:   Past Medical History: Past Medical History:  Diagnosis Date  . Anxiety   . Arthritis   . Asthma   . Depression   . High blood pressure   . History of bladder problems   . Migraines   . Sleep apnea     Tobacco Use: Social History   Tobacco Use  Smoking Status Current Every Day Smoker  . Packs/day: 1.00  . Years: 55.00  . Pack years: 55.00  . Types: Cigarettes  Smokeless Tobacco Never Used  Tobacco Comment   smokes 1 pack per day 01/09/2021    Labs: Recent Review Flowsheet Data   There is no flowsheet data to display.     Capillary Blood Glucose: No results found for: GLUCAP   Pulmonary Assessment Scores:  Pulmonary Assessment Scores    Row Name 01/12/21 1301         ADL UCSD   ADL Phase Entry     SOB Score total 99           CAT Score   CAT Score 40           mMRC Score   mMRC Score 4           UCSD: Self-administered rating  of dyspnea associated with activities of daily living (ADLs) 6-point scale (0 = "not at all" to 5 = "maximal or unable to do because of breathlessness")  Scoring Scores range from 0 to 120.  Minimally important difference is 5 units  CAT: CAT can identify the health impairment of COPD patients and is better correlated with disease progression.  CAT has a scoring range of zero to 40. The CAT score is classified into four groups of low (less than 10), medium (10 - 20), high (21-30) and very high (31-40) based on the impact level of disease on health status. A CAT score over 10 suggests significant symptoms.  A worsening CAT score could be explained by an exacerbation, poor medication adherence, poor inhaler technique, or progression of COPD or comorbid conditions.  CAT MCID is 2  points  mMRC: mMRC (Modified Medical Research Council) Dyspnea Scale is used to assess the degree of baseline functional disability in patients of respiratory disease due to dyspnea. No minimal important difference is established. A decrease in score of 1 point or greater is considered a positive change.   Pulmonary Function Assessment:   Exercise Target Goals: Exercise Program Goal: Individual exercise prescription set using results from initial 6 min walk test and THRR while considering  patient's activity barriers and safety.   Exercise Prescription Goal: Initial exercise prescription builds to 30-45 minutes a day of aerobic activity, 2-3 days per week.  Home exercise guidelines will be given to patient during program as part of exercise prescription that the participant will acknowledge.  Activity Barriers & Risk Stratification:  Activity Barriers & Cardiac Risk Stratification - 01/12/21 1302      Activity Barriers & Cardiac Risk Stratification   Activity Barriers Arthritis;Fibromyalgia;Neck/Spine Problems;Deconditioning;Muscular Weakness;Shortness of Breath    Cardiac Risk Stratification Low           6 Minute Walk:  6 Minute Walk    Row Name 01/12/21 1442         6 Minute Walk   Phase Initial     Distance 1000 feet     Walk Time 6 minutes     # of Rest Breaks 1     MPH 1.89     METS 3.11     RPE 12     Perceived Dyspnea  19     VO2 Peak 10.88     Resting HR 85 bpm     Resting BP 106/66     Resting Oxygen Saturation  97 %     Exercise Oxygen Saturation  during 6 min walk 91 %     Max Ex. HR 116 bpm     Max Ex. BP 168/50     2 Minute Post BP 154/60           Interval HR   1 Minute HR 108     2 Minute HR 111     3 Minute HR 109     4 Minute HR 109     5 Minute HR 115     6 Minute HR 116     2 Minute Post HR 100     Interval Heart Rate? Yes           Interval Oxygen   Interval Oxygen? Yes     Baseline Oxygen Saturation % 97 %     1 Minute Oxygen  Saturation % 95 %     1 Minute Liters of Oxygen 3 L     2  Minute Oxygen Saturation % 94 %     2 Minute Liters of Oxygen 3 L     3 Minute Oxygen Saturation % 95 %     3 Minute Liters of Oxygen 3 L     4 Minute Oxygen Saturation % 95 %     4 Minute Liters of Oxygen 3 L     5 Minute Oxygen Saturation % 91 %     5 Minute Liters of Oxygen 3 L     6 Minute Oxygen Saturation % 94 %     6 Minute Liters of Oxygen 3 L     2 Minute Post Oxygen Saturation % 97 %     2 Minute Post Liters of Oxygen 3 L            Oxygen Initial Assessment:  Oxygen Initial Assessment - 01/12/21 1441      Initial 6 min Walk   Oxygen Used Continuous    Liters per minute 3      Program Oxygen Prescription   Program Oxygen Prescription Continuous    Liters per minute 3      Intervention   Short Term Goals To learn and exhibit compliance with exercise, home and travel O2 prescription;To learn and understand importance of monitoring SPO2 with pulse oximeter and demonstrate accurate use of the pulse oximeter.;To learn and understand importance of maintaining oxygen saturations>88%;To learn and demonstrate proper pursed lip breathing techniques or other breathing techniques.;To learn and demonstrate proper use of respiratory medications    Long  Term Goals Exhibits compliance with exercise, home and travel O2 prescription;Verbalizes importance of monitoring SPO2 with pulse oximeter and return demonstration;Maintenance of O2 saturations>88%;Exhibits proper breathing techniques, such as pursed lip breathing or other method taught during program session;Compliance with respiratory medication;Demonstrates proper use of MDI's           Oxygen Re-Evaluation:   Oxygen Discharge (Final Oxygen Re-Evaluation):   Initial Exercise Prescription:  Initial Exercise Prescription - 01/12/21 1400      Date of Initial Exercise RX and Referring Provider   Date 01/12/21    Referring Provider Dr. Erin Fulling    Expected Discharge  Date 05/18/21      Oxygen   Oxygen Continuous    Liters 3      NuStep   Level 1    SPM 60    Minutes 39      Prescription Details   Frequency (times per week) 2    Duration Progress to 30 minutes of continuous aerobic without signs/symptoms of physical distress      Intensity   THRR 40-80% of Max Heartrate 65-130    Ratings of Perceived Exertion 11-13    Perceived Dyspnea 0-4      Resistance Training   Training Prescription Yes    Weight 2 lbs    Reps 10-15           Perform Capillary Blood Glucose checks as needed.  Exercise Prescription Changes:   Exercise Comments:   Exercise Goals and Review:   Exercise Goals    Row Name 01/12/21 1446             Exercise Goals   Increase Physical Activity Yes       Intervention Provide advice, education, support and counseling about physical activity/exercise needs.;Develop an individualized exercise prescription for aerobic and resistive training based on initial evaluation findings, risk stratification, comorbidities and participant's personal goals.       Expected Outcomes Short  Term: Attend rehab on a regular basis to increase amount of physical activity.;Long Term: Add in home exercise to make exercise part of routine and to increase amount of physical activity.;Long Term: Exercising regularly at least 3-5 days a week.       Increase Strength and Stamina Yes       Intervention Provide advice, education, support and counseling about physical activity/exercise needs.;Develop an individualized exercise prescription for aerobic and resistive training based on initial evaluation findings, risk stratification, comorbidities and participant's personal goals.       Expected Outcomes Short Term: Increase workloads from initial exercise prescription for resistance, speed, and METs.;Short Term: Perform resistance training exercises routinely during rehab and add in resistance training at home;Long Term: Improve cardiorespiratory  fitness, muscular endurance and strength as measured by increased METs and functional capacity (6MWT)       Able to understand and use rate of perceived exertion (RPE) scale Yes       Intervention Provide education and explanation on how to use RPE scale       Expected Outcomes Short Term: Able to use RPE daily in rehab to express subjective intensity level;Long Term:  Able to use RPE to guide intensity level when exercising independently       Able to understand and use Dyspnea scale Yes       Intervention Provide education and explanation on how to use Dyspnea scale       Expected Outcomes Short Term: Able to use Dyspnea scale daily in rehab to express subjective sense of shortness of breath during exertion;Long Term: Able to use Dyspnea scale to guide intensity level when exercising independently       Knowledge and understanding of Target Heart Rate Range (THRR) Yes       Intervention Provide education and explanation of THRR including how the numbers were predicted and where they are located for reference       Expected Outcomes Short Term: Able to state/look up THRR;Long Term: Able to use THRR to govern intensity when exercising independently;Short Term: Able to use daily as guideline for intensity in rehab       Understanding of Exercise Prescription Yes       Intervention Provide education, explanation, and written materials on patient's individual exercise prescription       Expected Outcomes Short Term: Able to explain program exercise prescription;Long Term: Able to explain home exercise prescription to exercise independently              Exercise Goals Re-Evaluation :  Exercise Goals Re-Evaluation    Row Name 01/17/21 1224             Exercise Goal Re-Evaluation   Exercise Goals Review Increase Physical Activity;Increase Strength and Stamina;Able to understand and use rate of perceived exertion (RPE) scale;Able to understand and use Dyspnea scale;Knowledge and understanding of  Target Heart Rate Range (THRR);Understanding of Exercise Prescription       Comments Patient was supposed to start rehab on 01/17/21 but called out due to not feeling well. She is going to have her firts exercise session on 01-19-21 when she is feeling better.              Discharge Exercise Prescription (Final Exercise Prescription Changes):   Nutrition:  Target Goals: Understanding of nutrition guidelines, daily intake of sodium 1500mg , cholesterol 200mg , calories 30% from fat and 7% or less from saturated fats, daily to have 5 or more servings of fruits and vegetables.  Biometrics:  Pre Biometrics - 01/12/21 1446      Pre Biometrics   Height 5\' 2"  (1.575 m)    Weight 83.4 kg    Waist Circumference 47 inches    Hip Circumference 47 inches    Waist to Hip Ratio 1 %    BMI (Calculated) 33.62    Triceps Skinfold 17 mm    % Body Fat 43.5 %    Grip Strength 25.7 kg    Flexibility 0 in    Single Leg Stand 21.48 seconds            Nutrition Therapy Plan and Nutrition Goals:  Nutrition Therapy & Goals - 01/16/21 1252      Personal Nutrition Goals   Comments Patient scored 29 on her diet assessment. We provide 2 educational sessions with handouts on heart healthy nutrition and offer RD referral at orientation.      Intervention Plan   Intervention Nutrition handout(s) given to patient.           Nutrition Assessments:  Nutrition Assessments - 01/12/21 1306      MEDFICTS Scores   Pre Score 29          MEDIFICTS Score Key:  ?70 Need to make dietary changes   40-70 Heart Healthy Diet  ? 40 Therapeutic Level Cholesterol Diet   Picture Your Plate Scores:  <89 Unhealthy dietary pattern with much room for improvement.  41-50 Dietary pattern unlikely to meet recommendations for good health and room for improvement.  51-60 More healthful dietary pattern, with some room for improvement.   >60 Healthy dietary pattern, although there may be some specific behaviors  that could be improved.    Nutrition Goals Re-Evaluation:   Nutrition Goals Discharge (Final Nutrition Goals Re-Evaluation):   Psychosocial: Target Goals: Acknowledge presence or absence of significant depression and/or stress, maximize coping skills, provide positive support system. Participant is able to verbalize types and ability to use techniques and skills needed for reducing stress and depression.  Initial Review & Psychosocial Screening:  Initial Psych Review & Screening - 01/12/21 1303      Initial Review   Current issues with History of Depression;Current Stress Concerns    Source of Stress Concerns Financial;Chronic Illness;Retirement/disability;Unable to perform yard/household activities      [place]? Yes    Comments Her son and daughter in law support her mentally. She lives with them and they support her daily. She has a brother who lives in [place] who supports her financially now that she is on disability and has a limited income. She also has a daughter who supports her as well.      Barriers   Psychosocial barriers to participate in program The patient should benefit from training in stress management and relaxation.      Screening Interventions   Interventions Encouraged to exercise    Expected Outcomes Long Term Goal: Stressors or current issues are controlled or eliminated.;Short Term goal: Identification and review with participant of any Quality of Life or Depression concerns found by scoring the questionnaire.;Long Term goal: The participant improves quality of Life and PHQ9 Scores as seen by post scores and/or verbalization of changes           Quality of Life Scores:  Quality of Life - 01/12/21 1449      Quality of Life   Select Quality of Life      Quality of Life Scores   Health/Function Pre 2.5 %  Socioeconomic Pre 15.36 %    Psych/Spiritual Pre 4.64 %    Family Pre 18.8 %    GLOBAL Pre 7.83 %          Scores  of 19 and below usually indicate a poorer quality of life in these areas.  A difference of  2-3 points is a clinically meaningful difference.  A difference of 2-3 points in the total score of the Quality of Life Index has been associated with significant improvement in overall quality of life, self-image, physical symptoms, and general health in studies assessing change in quality of life.   PHQ-9: Recent Review Flowsheet Data    Depression screen [facility] 2/9 01/12/2021 08/15/2020 08/15/2020   Decreased Interest 3 0 0   Down, Depressed, Hopeless 3 0 0   PHQ - 2 Score 6 0 0   Altered sleeping 3 0 -   Tired, decreased energy 3 0 -   Change in appetite 2 0 -   Feeling bad or failure about yourself  3 0 -   Trouble concentrating 3 0 -   Moving slowly or fidgety/restless 0 0 -   Suicidal thoughts 2  0 -   PHQ-9 Score 22 0 -   Difficult doing work/chores Very difficult Not difficult at all -     Interpretation of Total Score  Total Score Depression Severity:  1-4 = Minimal depression, 5-9 = Mild depression, 10-14 = Moderate depression, 15-19 = Moderately severe depression, 20-27 = Severe depression   Psychosocial Evaluation and Intervention:  Psychosocial Evaluation - 01/12/21 1510      Psychosocial Evaluation & Interventions   Interventions Encouraged to exercise with the program and follow exercise prescription;Stress management education    Comments Pt has no identifiable barriers to participating in pulmonary rehab. She currently rates her stress as very high due to chronic illness and finances. Her COPD has greatly limited her ability to do things that she used to do. She is worried about her finances. 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 and has been on short term disability for over a year now. She is currently in the process of applying for long term disability. She lives with her son and daughter-in-law, who also struggle with money. She is no longer able to help them  out as much anymore. She does have a history of depression and her QOL (7.83) and PHQ-9 (22) could indicate depression. She declined counceling services when offered. Due to her stress she has recently increased her smoking from about 5 cigarettes per day to 15. She has been working with a smoking cessation coach who provided her with paperwork and nicotine patches. They agreed that her quit day will be 01/14/2021. She is confident that this will work. She reports that her support system is her sister, son, and daughter-in-law. Her brother who lives in [place] supports her financially. She states that he has stepped up and helped her pay her bills since she is no longer able to work. She does have a positive outlook and believes this program will help her. Her goals are to lose some weight, decrease her SOB with ADL's, and to quit smoking.    Expected Outcomes The patient will quit smoking, and her stress levels will decrease with exercise and education on coping with stress.    Continue Psychosocial Services  Follow up required by staff           Psychosocial Re-Evaluation:  Psychosocial Re-Evaluation  [place] Name 01/16/21 1256             Psychosocial Re-Evaluation   Current issues with History of Depression       Comments Patient is new to the program planning to start 01/17/21. She has a history of depression which is being managed with cymbalta. Will continue to monitor her progress.       Expected Outcomes Patient's depression with continued to be managed with medication with no additional psychosocial issues identified.       Interventions Stress management education;Encouraged to attend Pulmonary Rehabilitation for the exercise;Relaxation education       Continue Psychosocial Services  No Follow up required              Psychosocial Discharge (Final Psychosocial Re-Evaluation):  Psychosocial Re-Evaluation - 01/16/21 1256      Psychosocial Re-Evaluation   Current issues with History of  Depression    Comments Patient is new to the program planning to start 01/17/21. She has a history of depression which is being managed with cymbalta. Will continue to monitor her progress.    Expected Outcomes Patient's depression with continued to be managed with medication with no additional psychosocial issues identified.    Interventions Stress management education;Encouraged to attend Pulmonary Rehabilitation for the exercise;Relaxation education    Continue Psychosocial Services  No Follow up required            Education: Education Goals: Education classes will be provided on a weekly basis, covering required topics. Participant will state understanding/return demonstration of topics presented.  Learning Barriers/Preferences:   Education Topics: How Lungs Work and Diseases: - Discuss the anatomy of the lungs and diseases that can affect the lungs, such as COPD.   Exercise: -Discuss the importance of exercise, FITT principles of exercise, normal and abnormal responses to exercise, and how to exercise safely.   Environmental Irritants: -Discuss types of environmental irritants and how to limit exposure to environmental irritants.   Meds/Inhalers and oxygen: - Discuss respiratory medications, definition of an inhaler and oxygen, and the proper way to use an inhaler and oxygen.   Energy Saving Techniques: - Discuss methods to conserve energy and decrease shortness of breath when performing activities of daily living.    Bronchial Hygiene / Breathing Techniques: - Discuss breathing mechanics, pursed-lip breathing technique,  proper posture, effective ways to clear airways, and other functional breathing techniques   Cleaning Equipment: - Provides group verbal and written instruction about the health risks of elevated stress, cause of high stress, and healthy ways to reduce stress.   Nutrition I: Fats: - Discuss the types of cholesterol, what cholesterol does to the  body, and how cholesterol levels can be controlled.   Nutrition II: Labels: -Discuss the different components of food labels and how to read food labels.   Respiratory Infections: - Discuss the signs and symptoms of respiratory infections, ways to prevent respiratory infections, and the importance of seeking medical treatment when having a respiratory infection.   Stress I: Signs and Symptoms: - Discuss the causes of stress, how stress may lead to anxiety and depression, and ways to limit stress.   Stress II: Relaxation: -Discuss relaxation techniques to limit stress.   Oxygen for Home/Travel: - Discuss how to prepare for travel when on oxygen and proper ways to transport and store oxygen to ensure safety.   Knowledge Questionnaire Score:  Knowledge Questionnaire Score - 01/12/21 1309      Knowledge Questionnaire Score   Pre Score  17/18           Core Components/Risk Factors/Patient Goals at Admission:  Personal Goals and Risk Factors at Admission - 01/12/21 1309      Core Components/Risk Factors/Patient Goals on Admission    Weight Management Yes;Weight Loss    Expected Outcomes Short Term: Continue to assess and modify interventions until short term weight is achieved;Long Term: Adherence to nutrition and physical activity/exercise program aimed toward attainment of established weight goal;Weight Loss: Understanding of general recommendations for a balanced deficit meal plan, which promotes 1-2 lb weight loss per week and includes a negative energy balance of [phone] kcal/d;Understanding recommendations for meals to include 15-35% energy as protein, 25-35% energy from fat, 35-60% energy from carbohydrates, less than 200mg  of dietary cholesterol, 20-35 gm of total fiber daily;Understanding of distribution of calorie intake throughout the day with the consumption of 4-5 meals/snacks    Tobacco Cessation Yes    Number of packs per day Currently is smoking 15 cigarettes per  day. She has a quit date picked out of April 2.    Intervention Assist the participant in steps to quit. Provide individualized education and counseling about committing to Tobacco Cessation, relapse prevention, and pharmacological support that can be provided by physician.;Advice worker, assist with locating and accessing local/national Quit Smoking programs, and support quit date choice.    Expected Outcomes Short Term: Will demonstrate readiness to quit, by selecting a quit date.;Long Term: Complete abstinence from all tobacco products for at least 12 months from quit date.;Short Term: Will quit all tobacco product use, adhering to prevention of relapse plan.    Improve shortness of breath with ADL's Yes    Intervention Provide education, individualized exercise plan and daily activity instruction to help decrease symptoms of SOB with activities of daily living.    Expected Outcomes Short Term: Improve cardiorespiratory fitness to achieve a reduction of symptoms when performing ADLs;Long Term: Be able to perform more ADLs without symptoms or delay the onset of symptoms    Stress Yes    Intervention Offer individual and/or small group education and counseling on adjustment to heart disease, stress management and health-related lifestyle change. Teach and support self-help strategies.;Refer participants experiencing significant psychosocial distress to appropriate mental health specialists for further evaluation and treatment. When possible, include family members and significant others in education/counseling sessions.    Expected Outcomes Short Term: Participant demonstrates changes in health-related behavior, relaxation and other stress management skills, ability to obtain effective social support, and compliance with psychotropic medications if prescribed.;Long Term: Emotional wellbeing is indicated by absence of clinically significant psychosocial distress or social isolation.            Core Components/Risk Factors/Patient Goals Review:   Goals and Risk Factor Review    Row Name 01/16/21 1253             Core Components/Risk Factors/Patient Goals Review   Personal Goals Review Tobacco Cessation;Weight Management/Obesity;Improve shortness of breath with ADL's;Other       Review Patient referred to PR with COPD. She plans to start the program 01/17/21. Her personal goals are to lose weight; quit smoking and have less SOB with her ADL's. We will monitor her progress as she works toward meeting these goals.       Expected Outcomes Patient will complete the program meeting both personal and program goals.              Core Components/Risk Factors/Patient Goals at Discharge (Final Review):  Goals and Risk Factor Review - 01/16/21 1253      Core Components/Risk Factors/Patient Goals Review   Personal Goals Review Tobacco Cessation;Weight Management/Obesity;Improve shortness of breath with ADL's;Other    Review Patient referred to PR with COPD. She plans to start the program 01/17/21. Her personal goals are to lose weight; quit smoking and have less SOB with her ADL's. We will monitor her progress as she works toward meeting these goals.    Expected Outcomes Patient will complete the program meeting both personal and program goals.           ITP Comments:   Comments: ITP REVIEW Patient has not started the program yet. Plans to start this week.

## 2021-01-19 ENCOUNTER — Encounter (HOSPITAL_COMMUNITY): Payer: 59

## 2021-01-19 DIAGNOSIS — Z0271 Encounter for disability determination: Secondary | ICD-10-CM

## 2021-01-19 NOTE — Telephone Encounter (Signed)
Patient asking for her letter for the Month of April.   Sending to Dr. Erin Fulling for recommendations.

## 2021-01-23 ENCOUNTER — Encounter: Payer: Self-pay | Admitting: *Deleted

## 2021-01-23 NOTE — Telephone Encounter (Signed)
Letter stamped with JD's signature and mailed to the home address pt gave.

## 2021-01-24 ENCOUNTER — Encounter (HOSPITAL_COMMUNITY): Payer: 59

## 2021-01-24 ENCOUNTER — Ambulatory Visit: Payer: 59 | Attending: Pulmonary Disease | Admitting: Pulmonary Disease

## 2021-01-24 ENCOUNTER — Other Ambulatory Visit: Payer: Self-pay

## 2021-01-24 DIAGNOSIS — G4733 Obstructive sleep apnea (adult) (pediatric): Secondary | ICD-10-CM | POA: Insufficient documentation

## 2021-01-24 DIAGNOSIS — J449 Chronic obstructive pulmonary disease, unspecified: Secondary | ICD-10-CM | POA: Diagnosis not present

## 2021-01-24 DIAGNOSIS — J9611 Chronic respiratory failure with hypoxia: Secondary | ICD-10-CM

## 2021-01-24 DIAGNOSIS — G4761 Periodic limb movement disorder: Secondary | ICD-10-CM | POA: Insufficient documentation

## 2021-01-26 ENCOUNTER — Encounter (HOSPITAL_COMMUNITY): Payer: 59

## 2021-01-27 ENCOUNTER — Other Ambulatory Visit: Payer: Self-pay | Admitting: Critical Care Medicine

## 2021-01-31 ENCOUNTER — Encounter (HOSPITAL_COMMUNITY): Payer: 59

## 2021-01-31 ENCOUNTER — Telehealth: Payer: Self-pay | Admitting: Pulmonary Disease

## 2021-01-31 ENCOUNTER — Other Ambulatory Visit: Payer: Self-pay | Admitting: Neurosurgery

## 2021-01-31 DIAGNOSIS — G4733 Obstructive sleep apnea (adult) (pediatric): Secondary | ICD-10-CM

## 2021-01-31 DIAGNOSIS — G8929 Other chronic pain: Secondary | ICD-10-CM

## 2021-01-31 DIAGNOSIS — M5442 Lumbago with sciatica, left side: Secondary | ICD-10-CM

## 2021-01-31 NOTE — Procedures (Signed)
Patient Name: Jillian, [name] Date: 01/24/2021 Gender: Female D.O.B: 24-Apr-1966 Age (years): 55 Referring Provider: Freda Jackson Height (inches): 5 Interpreting Physician: Chesley Mires MD, ABSM Weight (lbs): 180 RPSGT: Rosebud Poles BMI: 33 MRN: [id number] Neck Size: 17.50  CLINICAL INFORMATION The patient is referred for a CPAP titration to treat sleep apnea.  Date of NPSG 11/21/20: AHI 51, SpO2 low 74%.   SLEEP STUDY TECHNIQUE As per the AASM Manual for the Scoring of Sleep and Associated Events v2.3 (April 2016) with a hypopnea requiring 4% desaturations.  The channels recorded and monitored were frontal, central and occipital EEG, electrooculogram (EOG), submentalis EMG (chin), nasal and oral airflow, thoracic and abdominal wall motion, anterior tibialis EMG, snore microphone, electrocardiogram, and pulse oximetry. Continuous positive airway pressure (CPAP) was initiated at the beginning of the study and titrated to treat sleep-disordered breathing.  MEDICATIONS Medications self-administered by patient taken the night of the study : N/A  TECHNICIAN COMMENTS Comments added by technician: Patient tolerated CPAP very well. CPAP started at 4 cm of H2O and 12 cm of H2O with EPR of 1. Events alleviated at final pressure, although desats were noted in REM stage not associated with events. PLMS noticed through out study. SAO2 range 87-90%, at final pressure of 12 cm of H2O. EKG = Tachycardia at times Comments added by scorer: N/A  RESPIRATORY PARAMETERS Optimal PAP Pressure (cm): 12 AHI at Optimal Pressure (/hr): N/A Overall Minimal O2 (%): 83.00 Supine % at Optimal Pressure (%): N/A Minimal O2 at Optimal Pressure (%): 83.00   SLEEP ARCHITECTURE The study was initiated at 10:15:12 PM and ended at 5:37:19 AM.  Sleep onset time was 32.8 minutes and the sleep efficiency was 82.1%. The total sleep time was 363 minutes.  The patient spent 4.27% of the night in stage N1  sleep, 61.43% in stage N2 sleep, 15.43% in stage N3 and 18.9% in REM.Stage REM latency was 174.0 minutes  Wake after sleep onset was 46.4. Alpha intrusion was absent. Supine sleep was 53.42%.  CARDIAC DATA The 2 lead EKG demonstrated sinus rhythm. The mean heart rate was 85.39 beats per minute. Other EKG findings include: None.  LEG MOVEMENT DATA The total Periodic Limb Movements of Sleep (PLMS) were 700. The PLMS index was 115.70. A PLMS index of <15 is considered normal in adults.  IMPRESSIONS - The optimal PAP pressure was 12 cm of water. - SpO2 low was 83%.  She continued to have oxygen desaturation, particularly during REM sleep, in the abscence of other respiratory events.  Unfortunately, she was not tried on supplemental oxygen with CPAP during this study. - She had frequent periodic limb movements, but these improved with improved control of her sleep apnea.  DIAGNOSIS - Obstructive Sleep Apnea (G47.33) - Periodic Limb Movement During Sleep (G47.61)  RECOMMENDATIONS - Trial of CPAP therapy on 12 cm H2O with a Small Cushion size [employer]ips Respironics Minimal Contact Dreamwear Under Nose Frame (S) mask and heated humidification. - She should have overnight oximetry performed while using CPAP.  If she still has oxygen desaturation with CPAP, then she might need a repeat in lab titration study to determine whether she would benefit from use of supplemental oxygen with CPAP therapy. - Avoid alcohol, sedatives and other CNS depressants that may worsen sleep apnea and disrupt normal sleep architecture. - Sleep hygiene should be reviewed to assess factors that may improve sleep quality. - Weight management and regular exercise should be initiated or continued.  [Electronically signed] 01/31/2021 08:38  AM  Chesley Mires MD, ABSM Diplomate, American Board of Sleep Medicine   NPI: [id number]

## 2021-01-31 NOTE — Telephone Encounter (Signed)
CPAP titration 01/24/21 >> CPAP 12 cm H2O >> AHI 0, SpO2 low 84%; wasn't tried on supplemental oxygen.   Please let her know she had good control of sleep apnea with CPAP during her sleep study.  Please send order to arrange for CPAP set up at 12 cm H2O with heated humidity and mask of choice.  Please arrange for overnight oximetry with her using CPAP 12 cm H2O.  Please arrange for follow up with me in 3 to 4 months.

## 2021-02-02 ENCOUNTER — Encounter (HOSPITAL_COMMUNITY): Payer: 59

## 2021-02-03 NOTE — Telephone Encounter (Signed)
Called and left message on voicemail to please return phone call to go over CPAP titration results. Contact number provided. 

## 2021-02-03 NOTE — Addendum Note (Signed)
Addended by: Merrilee Seashore on: 02/03/2021 03:50 PM   Modules accepted: Orders

## 2021-02-03 NOTE — Telephone Encounter (Signed)
Patient returned phone call, confirmed name and birth date and went over CPAP titration results. All questions answered and patient expressed full understanding of results and in agreement with Dr Juanetta Gosling recommendations. Patient states she is not currently using a cpap. Orders placed for cpap and ONO with her using CPAP 12 cm H2O per Dr Halford Chessman. Patient aware and expressed understanding to scheduled follow up office visit with Dr Halford Chessman in 3-4 months. Recall placed. Nothing further needed at this time.

## 2021-02-06 NOTE — Progress Notes (Signed)
Discharge Progress Report  Patient Details  Name: Jillian [name] MRN: [id number] Date of Birth: [birth year] Referring Provider:   Flowsheet Row PULMONARY REHAB COPD ORIENTATION from 01/12/2021 in [facility]  Referring Provider Dr. Erin Fulling       Number of Visits: 1  Reason for Discharge:  Early Exit:  illness  Smoking History:  Social History   Tobacco Use  Smoking Status Current Every Day Smoker  . Packs/day: 1.00  . Years: 55.00  . Pack years: 55.00  . Types: Cigarettes  Smokeless Tobacco Never Used  Tobacco Comment   smokes 1 pack per day 01/09/2021    Diagnosis:  Chronic obstructive pulmonary disease, unspecified COPD type ([place])  ADL UCSD:  Pulmonary Assessment Scores    Row Name 01/12/21 1301         ADL UCSD   ADL Phase Entry     SOB Score total 99           CAT Score   CAT Score 40           mMRC Score   mMRC Score 4            Initial Exercise Prescription:  Initial Exercise Prescription - 01/12/21 1400      Date of Initial Exercise RX and Referring Provider   Date 01/12/21    Referring Provider Dr. Erin Fulling    Expected Discharge Date 05/18/21      Oxygen   Oxygen Continuous    Liters 3      NuStep   Level 1    SPM 60    Minutes 39      Prescription Details   Frequency (times per week) 2    Duration Progress to 30 minutes of continuous aerobic without signs/symptoms of physical distress      Intensity   THRR 40-80% of Max Heartrate 65-130    Ratings of Perceived Exertion 11-13    Perceived Dyspnea 0-4      Resistance Training   Training Prescription Yes    Weight 2 lbs    Reps 10-15           Discharge Exercise Prescription (Final Exercise Prescription Changes):   Functional Capacity:  6 Minute Walk    Row Name 01/12/21 1442         6 Minute Walk   Phase Initial     Distance 1000 feet     Walk Time 6 minutes     # of Rest Breaks 1     MPH 1.89     METS 3.11     RPE 12     Perceived  Dyspnea  19     VO2 Peak 10.88     Resting HR 85 bpm     Resting BP 106/66     Resting Oxygen Saturation  97 %     Exercise Oxygen Saturation  during 6 min walk 91 %     Max Ex. HR 116 bpm     Max Ex. BP 168/50     2 Minute Post BP 154/60           Interval HR   1 Minute HR 108     2 Minute HR 111     3 Minute HR 109     4 Minute HR 109     5 Minute HR 115     6 Minute HR 116     2 Minute Post HR 100  Interval Heart Rate? Yes           Interval Oxygen   Interval Oxygen? Yes     Baseline Oxygen Saturation % 97 %     1 Minute Oxygen Saturation % 95 %     1 Minute Liters of Oxygen 3 L     2 Minute Oxygen Saturation % 94 %     2 Minute Liters of Oxygen 3 L     3 Minute Oxygen Saturation % 95 %     3 Minute Liters of Oxygen 3 L     4 Minute Oxygen Saturation % 95 %     4 Minute Liters of Oxygen 3 L     5 Minute Oxygen Saturation % 91 %     5 Minute Liters of Oxygen 3 L     6 Minute Oxygen Saturation % 94 %     6 Minute Liters of Oxygen 3 L     2 Minute Post Oxygen Saturation % 97 %     2 Minute Post Liters of Oxygen 3 L            Psychological, QOL, Others - Outcomes: PHQ 2/9: Depression screen [facility] 2/9 01/12/2021 08/15/2020 08/15/2020  Decreased Interest 3 0 0  Down, Depressed, Hopeless 3 0 0  PHQ - 2 Score 6 0 0  Altered sleeping 3 0 -  Tired, decreased energy 3 0 -  Change in appetite 2 0 -  Feeling bad or failure about yourself  3 0 -  Trouble concentrating 3 0 -  Moving slowly or fidgety/restless 0 0 -  Suicidal thoughts 2 0 -  PHQ-9 Score 22 0 -  Difficult doing work/chores Very difficult Not difficult at all -    Quality of Life:  Quality of Life - 01/12/21 1449      Quality of Life   Select Quality of Life      Quality of Life Scores   Health/Function Pre 2.5 %    Socioeconomic Pre 15.36 %    Psych/Spiritual Pre 4.64 %    Family Pre 18.8 %    GLOBAL Pre 7.83 %           Personal Goals: Goals established at orientation with  interventions provided to work toward goal.  Personal Goals and Risk Factors at Admission - 01/12/21 1309      Core Components/Risk Factors/Patient Goals on Admission    Weight Management Yes;Weight Loss    Expected Outcomes Short Term: Continue to assess and modify interventions until short term weight is achieved;Long Term: Adherence to nutrition and physical activity/exercise program aimed toward attainment of established weight goal;Weight Loss: Understanding of general recommendations for a balanced deficit meal plan, which promotes 1-2 lb weight loss per week and includes a negative energy balance of [phone] kcal/d;Understanding recommendations for meals to include 15-35% energy as protein, 25-35% energy from fat, 35-60% energy from carbohydrates, less than 200mg  of dietary cholesterol, 20-35 gm of total fiber daily;Understanding of distribution of calorie intake throughout the day with the consumption of 4-5 meals/snacks    Tobacco Cessation Yes    Number of packs per day Currently is smoking 15 cigarettes per day. She has a quit date picked out of April 2.    Intervention Assist the participant in steps to quit. Provide individualized education and counseling about committing to Tobacco Cessation, relapse prevention, and pharmacological support that can be provided by physician.;Advice worker, assist with locating and accessing local/national  Quit Smoking programs, and support quit date choice.    Expected Outcomes Short Term: Will demonstrate readiness to quit, by selecting a quit date.;Long Term: Complete abstinence from all tobacco products for at least 12 months from quit date.;Short Term: Will quit all tobacco product use, adhering to prevention of relapse plan.    Improve shortness of breath with ADL's Yes    Intervention Provide education, individualized exercise plan and daily activity instruction to help decrease symptoms of SOB with activities of daily living.     Expected Outcomes Short Term: Improve cardiorespiratory fitness to achieve a reduction of symptoms when performing ADLs;Long Term: Be able to perform more ADLs without symptoms or delay the onset of symptoms    Stress Yes    Intervention Offer individual and/or small group education and counseling on adjustment to heart disease, stress management and health-related lifestyle change. Teach and support self-help strategies.;Refer participants experiencing significant psychosocial distress to appropriate mental health specialists for further evaluation and treatment. When possible, include family members and significant others in education/counseling sessions.    Expected Outcomes Short Term: Participant demonstrates changes in health-related behavior, relaxation and other stress management skills, ability to obtain effective social support, and compliance with psychotropic medications if prescribed.;Long Term: Emotional wellbeing is indicated by absence of clinically significant psychosocial distress or social isolation.            Personal Goals Discharge:  Goals and Risk Factor Review    Row Name 01/16/21 1253             Core Components/Risk Factors/Patient Goals Review   Personal Goals Review Tobacco Cessation;Weight Management/Obesity;Improve shortness of breath with ADL's;Other       Review Patient referred to PR with COPD. She plans to start the program 01/17/21. Her personal goals are to lose weight; quit smoking and have less SOB with her ADL's. We will monitor her progress as she works toward meeting these goals.       Expected Outcomes Patient will complete the program meeting both personal and program goals.              Exercise Goals and Review:  Exercise Goals    Row Name 01/12/21 1446             Exercise Goals   Increase Physical Activity Yes       Intervention Provide advice, education, support and counseling about physical activity/exercise needs.;Develop an  individualized exercise prescription for aerobic and resistive training based on initial evaluation findings, risk stratification, comorbidities and participant's personal goals.       Expected Outcomes Short Term: Attend rehab on a regular basis to increase amount of physical activity.;Long Term: Add in home exercise to make exercise part of routine and to increase amount of physical activity.;Long Term: Exercising regularly at least 3-5 days a week.       Increase Strength and Stamina Yes       Intervention Provide advice, education, support and counseling about physical activity/exercise needs.;Develop an individualized exercise prescription for aerobic and resistive training based on initial evaluation findings, risk stratification, comorbidities and participant's personal goals.       Expected Outcomes Short Term: Increase workloads from initial exercise prescription for resistance, speed, and METs.;Short Term: Perform resistance training exercises routinely during rehab and add in resistance training at home;Long Term: Improve cardiorespiratory fitness, muscular endurance and strength as measured by increased METs and functional capacity (6MWT)       Able to understand and use  rate of perceived exertion (RPE) scale Yes       Intervention Provide education and explanation on how to use RPE scale       Expected Outcomes Short Term: Able to use RPE daily in rehab to express subjective intensity level;Long Term:  Able to use RPE to guide intensity level when exercising independently       Able to understand and use Dyspnea scale Yes       Intervention Provide education and explanation on how to use Dyspnea scale       Expected Outcomes Short Term: Able to use Dyspnea scale daily in rehab to express subjective sense of shortness of breath during exertion;Long Term: Able to use Dyspnea scale to guide intensity level when exercising independently       Knowledge and understanding of Target Heart Rate Range  (THRR) Yes       Intervention Provide education and explanation of THRR including how the numbers were predicted and where they are located for reference       Expected Outcomes Short Term: Able to state/look up THRR;Long Term: Able to use THRR to govern intensity when exercising independently;Short Term: Able to use daily as guideline for intensity in rehab       Understanding of Exercise Prescription Yes       Intervention Provide education, explanation, and written materials on patient's individual exercise prescription       Expected Outcomes Short Term: Able to explain program exercise prescription;Long Term: Able to explain home exercise prescription to exercise independently              Exercise Goals Re-Evaluation:  Exercise Goals Re-Evaluation    Row Name 01/17/21 1224             Exercise Goal Re-Evaluation   Exercise Goals Review Increase Physical Activity;Increase Strength and Stamina;Able to understand and use rate of perceived exertion (RPE) scale;Able to understand and use Dyspnea scale;Knowledge and understanding of Target Heart Rate Range (THRR);Understanding of Exercise Prescription       Comments Patient was supposed to start rehab on 01/17/21 but called out due to not feeling well. She is going to have her firts exercise session on 01-19-21 when she is feeling better.              Nutrition & Weight - Outcomes:  Pre Biometrics - 01/12/21 1446      Pre Biometrics   Height 5\' 2"  (1.575 m)    Weight 83.4 kg    Waist Circumference 47 inches    Hip Circumference 47 inches    Waist to Hip Ratio 1 %    BMI (Calculated) 33.62    Triceps Skinfold 17 mm    % Body Fat 43.5 %    Grip Strength 25.7 kg    Flexibility 0 in    Single Leg Stand 21.48 seconds            Nutrition:  Nutrition Therapy & Goals - 01/16/21 1252      Personal Nutrition Goals   Comments Patient scored 29 on her diet assessment. We provide 2 educational sessions with handouts on heart  healthy nutrition and offer RD referral at orientation.      Intervention Plan   Intervention Nutrition handout(s) given to patient.           Nutrition Discharge:  Nutrition Assessments - 01/12/21 1306      MEDFICTS Scores   Pre Score 29  Education Questionnaire Score:  Knowledge Questionnaire Score - 01/12/21 1309      Knowledge Questionnaire Score   Pre Score 17/18          Patient was not able to start the program due to illness. Goals reviewed with patient; copy given to patient.

## 2021-02-06 NOTE — Addendum Note (Signed)
Encounter addended by: Lorin Glass on: 02/06/2021 2:57 PM  Actions taken: Clinical Note Signed, Episode resolved

## 2021-02-07 ENCOUNTER — Encounter (HOSPITAL_COMMUNITY): Payer: 59

## 2021-02-07 ENCOUNTER — Telehealth: Payer: Self-pay | Admitting: Pulmonary Disease

## 2021-02-07 NOTE — Telephone Encounter (Signed)
Hello Dr. Erin Fulling, this is just for [facility] and advise if you have any recommendations.. Thanks!

## 2021-02-07 NOTE — Telephone Encounter (Signed)
Spoke with the pt  She states that her CPAP order was sent to [facility], but would like to use [facility] instead bc that is where her o2 comes from  [facility]- can you send order to [facility] please, thanks!

## 2021-02-08 ENCOUNTER — Ambulatory Visit: Payer: 59 | Admitting: Pulmonary Disease

## 2021-02-09 ENCOUNTER — Encounter (HOSPITAL_COMMUNITY): Payer: 59

## 2021-02-09 NOTE — Telephone Encounter (Signed)
Order sent to [facility] to see if they are willing to accept pt for her cpap needs.  LM on pt's VM to advise.

## 2021-02-11 ENCOUNTER — Other Ambulatory Visit: Payer: 59

## 2021-02-13 NOTE — Telephone Encounter (Signed)
Patient sent email ask for a letter. Looks like we have written it the past two months, may we write the same letter again for May?   Hi! Its the first of the month and my Employer needs the May monthly work note for not being able to return to work. It also needs to be signed.  Once approved by Dr. Erin Fulling, could you please mail to me at my address  [address] [place] [address]. Hope you have a great day.      Thank you so much.

## 2021-02-13 NOTE — Telephone Encounter (Signed)
Dr. Erin Fulling please advise on the following My Chart message:  Hi! Sorry to bother you again but I was told to ask if in my medical records does Dr. Erin Fulling have me listed as disabled and unable to return to work ever, or is there something that would make [place] think I will return to work. I know I will never be able to work again. Every time I get active I can't breath, I use my rescue inhaler and it helps buy as soon as I start getting active again I can't breath again and have to sit down. Even with my Oxygen I still can't hardly breath if active. Can you check on that and let me know. They said that could be the reason for the delay on my Social Security Disability claim.  Thank you.  Thank you Dr. Erin Fulling

## 2021-02-14 ENCOUNTER — Encounter (HOSPITAL_COMMUNITY): Payer: 59

## 2021-02-14 NOTE — Telephone Encounter (Signed)
Dr. Erin Fulling, It looks like we have been providing the patient with a monthly note for work as she awaits her disability.  Please advise if ok to provide with a note for the month of May.  Thank you.

## 2021-02-16 ENCOUNTER — Encounter (HOSPITAL_COMMUNITY): Payer: 59

## 2021-02-19 ENCOUNTER — Ambulatory Visit
Admission: RE | Admit: 2021-02-19 | Discharge: 2021-02-19 | Disposition: A | Discharge status: Home / Self Care | Payer: 59 | Source: Ambulatory Visit | Attending: Neurosurgery | Admitting: Neurosurgery

## 2021-02-19 ENCOUNTER — Other Ambulatory Visit: Payer: Self-pay

## 2021-02-19 DIAGNOSIS — G8929 Other chronic pain: Secondary | ICD-10-CM

## 2021-02-21 ENCOUNTER — Encounter (HOSPITAL_COMMUNITY): Payer: 59

## 2021-02-23 ENCOUNTER — Encounter (HOSPITAL_COMMUNITY): Payer: 59

## 2021-02-27 MED ORDER — AMOXICILLIN-POT CLAVULANATE 875-125 MG PO TABS: 1.0000 | ORAL_TABLET | Freq: Two times a day (BID) | ORAL | 0 refills | Status: DC

## 2021-02-27 MED ORDER — PREDNISONE 20 MG PO TABS
20.0000 mg | ORAL_TABLET | Freq: Every day | ORAL | 0 refills | Status: DC
Start: 2021-02-27 — End: 2021-03-21

## 2021-02-27 NOTE — Telephone Encounter (Signed)
JD please advise on recs:  Pt c/o prod cough with green/yellow mucus, headache, sinus congestion, pnd, ear ache, sore throat, fatigue X3 days. Denies fever,loss of taste/smell, chest pain.  Pt is taking mucinex and alka seltzer to help with s/s but is not resolving symptoms. Requesting additional recs.  Pharmacy: [facility] in [place]

## 2021-02-28 ENCOUNTER — Encounter (HOSPITAL_COMMUNITY): Payer: 59

## 2021-03-02 ENCOUNTER — Encounter (HOSPITAL_COMMUNITY): Payer: 59

## 2021-03-07 ENCOUNTER — Encounter (HOSPITAL_COMMUNITY): Payer: 59

## 2021-03-09 ENCOUNTER — Encounter (HOSPITAL_COMMUNITY): Payer: 59

## 2021-03-14 ENCOUNTER — Encounter (HOSPITAL_COMMUNITY): Payer: 59

## 2021-03-16 ENCOUNTER — Encounter (HOSPITAL_COMMUNITY): Payer: 59

## 2021-03-20 ENCOUNTER — Encounter (HOSPITAL_COMMUNITY): Payer: Self-pay

## 2021-03-20 ENCOUNTER — Ambulatory Visit (HOSPITAL_COMMUNITY)
Admission: RE | Admit: 2021-03-20 | Discharge: 2021-03-20 | Disposition: A | Discharge status: Home / Self Care | Payer: 59 | Source: Ambulatory Visit | Attending: Pulmonary Disease | Admitting: Pulmonary Disease

## 2021-03-20 ENCOUNTER — Ambulatory Visit: Payer: 59

## 2021-03-20 DIAGNOSIS — R918 Other nonspecific abnormal finding of lung field: Secondary | ICD-10-CM | POA: Diagnosis present

## 2021-03-20 NOTE — Telephone Encounter (Signed)
Called patient she did has not read her message but she did not answer. Left a message for her to check her Mychart messages or call our office back in the morning so we can send in the meds for her. Will leave encounter open.

## 2021-03-20 NOTE — Telephone Encounter (Signed)
Ct is unchanged from priors and no evidence of pna so rec  zpak Prednisone 10 mg take  4 each am x 2 days,   2 each am x 2 days,  1 each am x 2 days and stop   Call I not improving toward end of week for ov prior to the weekend

## 2021-03-20 NOTE — Telephone Encounter (Signed)
This message was received for Dr. Erin Fulling this morning.  I attempted to call Patient for more details, but had to leave a message.  Patient is seen in office by Dr. Erin Fulling for COPD. Next OV is scheduled 03/27/21. Patient is followed by Dr. Halford Chessman for cpap.  Message from Patient- Hi. My chest is very tight. Started last week. Chest and upper back hurting from coughing, mainly a dry cough. Sometimes a yellowish/brown/green mucus. No fever and no covid. I had CT Scan done this morning and have a Dr. Hilaria Ota with Dr. Erin Fulling next Monday,  but can he please call me in something today. Thank you    Patient was seen for acute office visit  12/28/20 with Dr. Vaughan Browner   Instructions  I am sorry not feeling well Continue inhaler and nebs We will get a chest x-ray today Start prednisone at 60 mg.  Reduce by 10 mg every 2 days  Follow-up with Dr. Erin Fulling      Message routed to Dr Melvyn Novas- Doc of the day

## 2021-03-21 ENCOUNTER — Encounter (HOSPITAL_COMMUNITY): Payer: 59

## 2021-03-21 MED ORDER — AZITHROMYCIN 250 MG PO TABS: ORAL_TABLET | ORAL | 0 refills | Status: DC

## 2021-03-21 MED ORDER — PREDNISONE 10 MG PO TABS: ORAL_TABLET | ORAL | 0 refills | Status: AC

## 2021-03-23 ENCOUNTER — Encounter (HOSPITAL_COMMUNITY): Payer: 59

## 2021-03-27 ENCOUNTER — Ambulatory Visit: Payer: 59 | Admitting: Pulmonary Disease

## 2021-03-28 ENCOUNTER — Encounter (HOSPITAL_COMMUNITY): Payer: 59

## 2021-03-30 ENCOUNTER — Encounter (HOSPITAL_COMMUNITY): Payer: 59

## 2021-03-30 MED ORDER — FLUTICASONE-SALMETEROL 115-21 MCG/ACT IN AERO: 2.0000 | INHALATION_SPRAY | Freq: Two times a day (BID) | RESPIRATORY_TRACT | 1 refills | Status: DC

## 2021-03-30 NOTE — Telephone Encounter (Signed)
Dr. Halford Chessman, Patient is requesting a refill of Advair, but I do not see that we prescribed Advair, I see that we prescribed Spiriva.  Please advise.  Thank you.

## 2021-04-03 ENCOUNTER — Telehealth: Payer: Self-pay | Admitting: Acute Care

## 2021-04-03 MED ORDER — PREDNISONE 10 MG PO TABS: ORAL_TABLET | ORAL | 0 refills | Status: DC

## 2021-04-03 MED ORDER — AZITHROMYCIN 250 MG PO TABS: 250.0000 mg | ORAL_TABLET | ORAL | 0 refills | Status: DC

## 2021-04-03 MED ORDER — AZITHROMYCIN 250 MG PO TABS
250.0000 mg | ORAL_TABLET | ORAL | 0 refills | Status: DC
Start: 2021-04-03 — End: 2021-05-08

## 2021-04-03 NOTE — Telephone Encounter (Signed)
We need to remind patient's when they ask for patient advice request , it is not managed like a triage call, but has a 48 hour window to be answered. This was more appropriate as a triage call, and not a patient advice request. We need to make sure the staff and the patient's are aware of this. Thanks

## 2021-04-03 NOTE — Telephone Encounter (Signed)
I have sent rxs to pharm  Pt's preferred pharm listed in Email  Called the pt and no answer- LMTCB and will also email her meds were sent

## 2021-04-03 NOTE — Telephone Encounter (Signed)
Called and cancelled prescriptions sent to [facility].  New prescriptions for z pack and prednisone sent to requested [facility].  Nothing further at this time.

## 2021-04-03 NOTE — Telephone Encounter (Signed)
Please call in z pack and Prednisone taper; 10 mg tablets: 4 tabs x 2 days, 3 tabs x 2 days, 2 tabs x 2 days 1 tab x 2 days then stop.  She already has a follow up with Dr. Erin Fulling.  Thanks

## 2021-04-03 NOTE — Telephone Encounter (Signed)
Called and spoke with Patient. Patient is complaining with head congestion, sinus drainage,chest tightness, and a productive cough.  Patient stated she is coughing creamy, yellow sputum. Patient denies any fever or chills. Patient has taken a home covid test today and it was negative.  Patient is scheduled with Dr. Erin Fulling 05/08/21, but is concerned that she is needing a antibiotic and prednisone taper sent to [facility]. Patient stated Dr. Erin Fulling prescribed amoxicillin in the past and she thought it worked well.   Message routed to Judson Roch, NP to advise

## 2021-04-04 ENCOUNTER — Encounter (HOSPITAL_COMMUNITY): Payer: 59

## 2021-04-06 ENCOUNTER — Encounter (HOSPITAL_COMMUNITY): Payer: 59

## 2021-04-11 ENCOUNTER — Encounter (HOSPITAL_COMMUNITY): Payer: 59

## 2021-04-13 ENCOUNTER — Encounter (HOSPITAL_COMMUNITY): Payer: 59

## 2021-04-18 ENCOUNTER — Encounter (HOSPITAL_COMMUNITY): Payer: 59

## 2021-04-20 ENCOUNTER — Encounter (HOSPITAL_COMMUNITY): Payer: 59

## 2021-04-25 ENCOUNTER — Encounter (HOSPITAL_COMMUNITY): Payer: 59

## 2021-04-27 ENCOUNTER — Encounter (HOSPITAL_COMMUNITY): Payer: 59

## 2021-04-28 ENCOUNTER — Other Ambulatory Visit: Payer: Self-pay | Admitting: Pulmonary Disease

## 2021-05-02 ENCOUNTER — Encounter (HOSPITAL_COMMUNITY): Payer: 59

## 2021-05-04 ENCOUNTER — Encounter (HOSPITAL_COMMUNITY): Payer: 59

## 2021-05-08 ENCOUNTER — Ambulatory Visit (INDEPENDENT_AMBULATORY_CARE_PROVIDER_SITE_OTHER): Payer: 59 | Admitting: Pulmonary Disease

## 2021-05-08 ENCOUNTER — Other Ambulatory Visit: Payer: Self-pay

## 2021-05-08 ENCOUNTER — Encounter: Payer: Self-pay | Admitting: Pulmonary Disease

## 2021-05-08 VITALS — BP 102/68 | HR 91 | Temp 98.0°F | Resp 18 | Ht 62.0 in | Wt 181.4 lb

## 2021-05-08 DIAGNOSIS — J449 Chronic obstructive pulmonary disease, unspecified: Secondary | ICD-10-CM

## 2021-05-08 MED ORDER — ADVAIR HFA 230-21 MCG/ACT IN AERO: 2.0000 | INHALATION_SPRAY | Freq: Two times a day (BID) | RESPIRATORY_TRACT | 12 refills | Status: DC

## 2021-05-08 NOTE — Progress Notes (Signed)
Synopsis: Returns to clinic for follow up of COPD, former Dr. Ernest Mallick patient  Subjective:   PATIENT ID: Jillian Louis GENDER: female DOB: Mar 19, 1966, MRN: [id number]   Tired all the time.  Has not received her CPAP yet.  Using spriiva daily and advair 115-68mg Using albuterol 1-2 times per day Smoking 1 pack per day   HPI  Chief Complaint  Patient presents with   Follow-up    COPD    Jillian [name] a 55year old woman, daily smoker with chronic hypoxemic respiratory failure in setting of COPD/Emphysema on 3L of home O2 who returns to pulmonary clinic for follow up.   She reports her breathing is doing okay at this time.  She continues on Advair 1 15-21 MCG 2 puffs twice daily and Spiriva Respimat 2.5 MCG 2 puffs daily with as needed DuoNebs.  She did receive a course of prednisone and azithromycin last month for COPD exacerbation.  She continues to smoke 1 pack of cigarettes per day and and is having a difficult time cutting back or even considering quitting. Anxiety is contributing to her difficulty quitting.  She has not received a CPAP machine yet since being diagnosed with obstructive sleep apnea.  She continues to complain of significant fatigue on a daily basis.   Past Medical History:  Diagnosis Date   Anxiety    Arthritis    Asthma    Depression    High blood pressure    History of bladder problems    Migraines    Sleep apnea      Family History  Problem Relation Age of Onset   Diabetes Mother    COPD Mother    Heart attack Father    Diabetes Sister    Colon cancer Neg Hx    Celiac disease Neg Hx    Inflammatory bowel disease Neg Hx      Social History   Socioeconomic History   Marital status: Divorced    Spouse name: Not on file   Number of children: 1   Years of education: Not on file   Highest education level: High school graduate  Occupational History   Occupation: [employer] manager [employer]    Comment: currently on  short term disability  Tobacco Use   Smoking status: Every Day    Packs/day: 1.00    Years: 55.00    Pack years: 55.00    Types: Cigarettes   Smokeless tobacco: Never   Tobacco comments:    smokes 1 pack per day 01/09/2021  Vaping Use   Vaping Use: Never used  Substance and Sexual Activity   Alcohol use: Yes    Comment: hardly ever   Drug use: No   Sexual activity: Not on file  Other Topics Concern   Not on file  Social History Narrative   Not on file   Social Determinants of Health   Financial Resource Strain: Not on file  Food Insecurity: Not on file  Transportation Needs: Not on file  Physical Activity: Not on file  Stress: Not on file  Social Connections: Not on file  Intimate Partner Violence: Not on file     Allergies  Allergen Reactions   Doxycycline Photosensitivity    Broke out with blisters after being in the sun   Banana Hives     Outpatient Medications Prior to Visit  Medication Sig Dispense Refill   albuterol (VENTOLIN HFA) 108 (90 Base) MCG/ACT inhaler Inhale  2 puffs into the lungs every 6 (six) hours as needed for wheezing or shortness of breath. 24 [name] 2   Aspirin-Acetaminophen-Caffeine (GOODY HEADACHE PO) Take 1 tablet by mouth as needed.     DULoxetine (CYMBALTA) 60 MG capsule Take 60 mg by mouth 2 (two) times daily.      gabapentin (NEURONTIN) 300 MG capsule Take 300 mg by mouth 3 (three) times daily as needed.     ipratropium-albuterol (DUONEB) 0.5-2.5 (3) MG/3ML SOLN TAKE 3 MLS BY NEBULIZATION EVERY 4 (FOUR) HOURS AS NEEDED. 360 mL 3   loratadine (CLARITIN) 10 MG tablet Take 10 mg by mouth daily.     mometasone (NASONEX) 50 MCG/ACT nasal spray Place 2 sprays into the nose daily.     pantoprazole (PROTONIX) 40 MG tablet Take 1 tablet by mouth 2 (two) times daily before a meal.      Sodium Bicarbonate-Citric Acid (ALKA-SELTZER HEARTBURN PO) Take 1 tablet by mouth as needed (heartburn).     SPIRIVA RESPIMAT 2.5 MCG/ACT AERS 2 puffs daily.      valsartan-hydrochlorothiazide (DIOVAN-HCT) 160-12.5 MG tablet Take 1 tablet by mouth daily.     vitamin B-12 (CYANOCOBALAMIN) 1000 MCG tablet Take 1,000 mcg by mouth daily.     Vitamin D, Ergocalciferol, (DRISDOL) 1.25 MG (50000 UT) CAPS capsule Take 1 capsule by mouth once a week. tuesdays     amoxicillin-clavulanate (AUGMENTIN) 875-125 MG tablet Take 1 tablet by mouth 2 (two) times daily. 20 tablet 0   azithromycin (ZITHROMAX) 250 MG tablet 2 tablets today and then 1 daily until gone. 6 tablet 0   azithromycin (ZITHROMAX) 250 MG tablet Take 1 tablet (250 mg total) by mouth as directed. 6 tablet 0   fluticasone-salmeterol (ADVAIR HFA) 115-21 MCG/ACT inhaler Inhale 2 puffs into the lungs 2 (two) times daily. 3 each 1   predniSONE (DELTASONE) 10 MG tablet 4 x 2 days, 3 x 2 days, 2 x 2 days, 1 x 2 days, then stop 20 tablet 0   No facility-administered medications prior to visit.    Review of Systems  Constitutional:  Positive for malaise/fatigue. Negative for chills, fever and weight loss.  HENT:  Negative for congestion and sore throat.   Eyes: Negative.   Respiratory:  Positive for cough, sputum production, shortness of breath and wheezing. Negative for hemoptysis.   Cardiovascular:  Negative for chest pain, orthopnea, leg swelling and PND.  Gastrointestinal:  Negative for abdominal pain, heartburn, nausea and vomiting.  Genitourinary: Negative.   Musculoskeletal:  Negative for joint pain and myalgias.  Neurological:  Negative for dizziness, weakness and headaches.  Endo/Heme/Allergies: Negative.   Psychiatric/Behavioral:  The patient is nervous/anxious.    Objective:   Vitals:   05/08/21 1215  BP: 102/68  Pulse: 91  Resp: 18  Temp: 98 F (36.7 C)  TempSrc: Oral  SpO2: 95%  Weight: 181 lb 6.4 oz (82.3 kg)  Height: '5\' 2"'$  (1.575 m)     Physical Exam Constitutional:      Appearance: She is not ill-appearing.  Eyes:     Conjunctiva/sclera: Conjunctivae normal.     Pupils:  Pupils are equal, round, and reactive to light.  Cardiovascular:     Rate and Rhythm: Normal rate and regular rhythm.     Pulses: Normal pulses.     Heart sounds: Normal heart sounds. No murmur heard. Pulmonary:     Effort: Pulmonary effort is normal.     Breath sounds: Normal breath sounds. No wheezing, rhonchi or rales.  Abdominal:     General: Bowel sounds are normal.     Palpations: Abdomen is soft.  Musculoskeletal:     Right lower leg: No edema.     Left lower leg: No edema.  Skin:    General: Skin is warm and dry.     Capillary Refill: Capillary refill takes less than 2 seconds.  Neurological:     Mental Status: She is alert.  Psychiatric:        Mood and Affect: Mood normal.        Behavior: Behavior normal.        Thought Content: Thought content normal.        Judgment: Judgment normal.    CBC    Component Value Date/Time   WBC 13.0 (H) 11/25/2019 1540   RBC 5.17 (H) 11/25/2019 1540   HGB 15.5 (H) 11/25/2019 1540   HGB 14.9 08/22/2017 0849   HCT 47.4 (H) 11/25/2019 1540   HCT 44.5 08/22/2017 0849   PLT 313.0 11/25/2019 1540   MCV 91.8 11/25/2019 1540   MCV 96 08/22/2017 0849   MCH 31.7 07/10/2019 1125   MCHC 32.6 11/25/2019 1540   RDW 15.5 11/25/2019 1540   RDW 14.7 08/22/2017 0849   LYMPHSABS 2.7 11/25/2019 1540   LYMPHSABS 2.3 08/22/2017 0849   MONOABS 1.1 (H) 11/25/2019 1540   EOSABS 1.2 (H) 11/25/2019 1540   EOSABS 1.0 (H) 08/22/2017 0849   BASOSABS 0.2 (H) 11/25/2019 1540   BASOSABS 0.1 08/22/2017 0849   Chest imaging: CT Chest WO Contrast 09/19/20 1. Pulmonary emphysema. 2. Background pattern of vague centrilobular pulmonary nodules with apical predominance, millimetric and unchanged. Findings may be indicative of respiratory bronchiolitis correlate with any smoking history. 3. Multiple small pulmonary nodules, not changed since December of 2020 and most not changed since July of 2020. Consider additional six-month follow-up for the largest in  the RIGHT lower lobe which is not well visualized but present on the study of May 13, 2019, perhaps due to differences in technique and slice thickness. 4. Other pulmonary nodules with benign and stable appearance. 5. aortic atherosclerosis.  PFT: PFT Results Latest Ref Rng & Units 01/18/2020  FVC-Pre L 1.58  FVC-Predicted Pre % 49  FVC-Post L 2.04  FVC-Predicted Post % 63  Pre FEV1/FVC % % 67  Post FEV1/FCV % % 70  FEV1-Pre L 1.06  FEV1-Predicted Pre % 41  FEV1-Post L 1.43  DLCO uncorrected ml/min/mmHg 13.53  DLCO UNC% % 69  DLCO corrected ml/min/mmHg 12.78  DLCO COR %Predicted % 65  DLVA Predicted % 71  TLC L 4.34  TLC % Predicted % 89  RV % Predicted % 144   Labs: 12/09/19 Alpha-1 Anti-trypsin '184mg'$ /dL (83 - 199 mg/dL), Phenotype MM  Echo: 12/31/19  1. Left ventricular ejection fraction, by estimation, is >75%. The left  ventricle has hyperdynamic function. The left ventricle has no regional  wall motion abnormalities. Left ventricular diastolic parameters were  normal.   2. Right ventricular systolic function is normal. The right ventricular  size is normal. There is normal pulmonary artery systolic pressure. The  estimated right ventricular systolic pressure is 9.8 mmHg.   3. The mitral valve is grossly normal. No evidence of mitral valve  regurgitation.   4. The aortic valve is tricuspid. Noncoronary cusp is mildly thickened.  Aortic valve regurgitation is not visualized.   5. The inferior vena cava is normal in size with greater than 50%  respiratory variability, suggesting right atrial pressure of 3  mmHg.  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   She is to continue Spiriva 2.5 MCG 2 puffs daily and we will increase her Advair dosing to 2 30-21 MCG 2 puffs twice daily given her ongoing shortness of breath.  We again discussed the importance of smoking cessation in regards to her lung health.  Improving her symptoms of cough, wheezing and shortness of breath.  I encouraged her to talk with her primary care physician about a referral to a psychiatrist or psychologist for further counseling given her significant anxiety.  She continues to wait for a CPAP machine for her obstructive sleep apnea.  I believe this will significantly help her fatigue once initiated.  CT chest scan 03/20/2021 showed stable right-sided pulmonary nodules.  Normal annual lung cancer screening can be resumed with a follow-up scan planned for June 2023.  Follow up visit in 6 months.  Freda Jackson, MD [facility] Office: [phone]    Current Outpatient Medications:    albuterol (VENTOLIN HFA) 108 (90 Base) MCG/ACT inhaler, Inhale 2 puffs into the lungs every 6 (six) hours as needed for wheezing or shortness of breath., Disp: 24 [name], Rfl: 2   Aspirin-Acetaminophen-Caffeine (GOODY HEADACHE PO), Take 1 tablet by mouth as needed., Disp: , Rfl:    DULoxetine (CYMBALTA) 60 MG capsule, Take 60 mg by mouth 2 (two) times daily. , Disp: , Rfl:    fluticasone-salmeterol (ADVAIR HFA) 230-21 MCG/ACT inhaler, Inhale 2 puffs into the lungs 2 (two) times daily., Disp: 1 each, Rfl: 12   gabapentin (NEURONTIN) 300 MG capsule, Take 300 mg by mouth 3 (three) times daily as needed., Disp: , Rfl:    ipratropium-albuterol (DUONEB) 0.5-2.5 (3) MG/3ML SOLN, TAKE 3 MLS BY NEBULIZATION EVERY 4 (FOUR) HOURS AS NEEDED., Disp: 360 mL, Rfl: 3   loratadine (CLARITIN) 10 MG tablet, Take 10 mg by mouth daily., Disp: , Rfl:    mometasone (NASONEX) 50 MCG/ACT nasal spray, Place 2 sprays into the nose daily., Disp: , Rfl:    pantoprazole (PROTONIX) 40 MG tablet,  Take 1 tablet by mouth 2 (two) times daily before a meal. , Disp: , Rfl:    Sodium Bicarbonate-Citric Acid (ALKA-SELTZER HEARTBURN PO), Take 1 tablet by mouth as needed (heartburn)., Disp: , Rfl:    SPIRIVA RESPIMAT 2.5 MCG/ACT AERS, 2 puffs daily., Disp: , Rfl:    valsartan-hydrochlorothiazide (DIOVAN-HCT) 160-12.5 MG tablet, Take 1 tablet by mouth daily., Disp: , Rfl:    vitamin B-12 (CYANOCOBALAMIN) 1000 MCG tablet, Take 1,000 mcg by mouth daily., Disp: , Rfl:    Vitamin D, Ergocalciferol, (DRISDOL) 1.25 MG (50000 UT) CAPS capsule, Take 1 capsule by mouth once a week. tuesdays, Disp: , Rfl:

## 2021-05-08 NOTE — Patient Instructions (Addendum)
Continue advair 230-78mg 2 puffs twice daily.  - rinse mouth out after each use  Continue Spiriva 2 puffs daily  Recommend smoking cessation or reduction with nicotine patches/gum/lozenges.

## 2021-05-09 ENCOUNTER — Encounter (HOSPITAL_COMMUNITY): Payer: 59

## 2021-05-11 ENCOUNTER — Encounter (HOSPITAL_COMMUNITY): Payer: 59

## 2021-05-16 ENCOUNTER — Encounter (HOSPITAL_COMMUNITY): Payer: 59

## 2021-05-18 ENCOUNTER — Encounter (HOSPITAL_COMMUNITY): Payer: 59

## 2021-05-21 ENCOUNTER — Other Ambulatory Visit: Payer: Self-pay | Admitting: Pulmonary Disease

## 2021-05-21 DIAGNOSIS — J449 Chronic obstructive pulmonary disease, unspecified: Secondary | ICD-10-CM

## 2021-05-21 NOTE — Telephone Encounter (Signed)
MW please advise.   Do we need to go back to the lower dose?  Her insurance will not cover the higher dose of the Advair HFA.   Thanks

## 2021-05-22 NOTE — Telephone Encounter (Signed)
[facility] advair hfa 230/21 is not covered by her insurance. The only cover the lowest dose-45/21 mcg. Are you okay switching her to this? Please advise, thanks!

## 2021-05-22 NOTE — Telephone Encounter (Signed)
Am I covering for Dr Erin Fulling - if so ok to go to lower dose x one month but check back with him before next rx with alternatives on her formulary for high dose advair

## 2021-05-23 NOTE — Telephone Encounter (Signed)
ATC Patient insurance to find out preferred inhaler list of ICS/LABA.  Patient insurance will have to be called between 8am and 3:30pm EST.   Will hold message in triage

## 2021-06-06 ENCOUNTER — Telehealth: Payer: Self-pay | Admitting: *Deleted

## 2021-06-06 MED ORDER — PREDNISONE 10 MG PO TABS: ORAL_TABLET | ORAL | 0 refills | Status: DC

## 2021-06-06 MED ORDER — AMOXICILLIN-POT CLAVULANATE 875-125 MG PO TABS: 1.0000 | ORAL_TABLET | Freq: Two times a day (BID) | ORAL | 0 refills | Status: DC

## 2021-06-06 NOTE — Telephone Encounter (Signed)
Attempted to call pt to gather more info from her about her mychart message that she sent [name] but unable to reach.  Left message for her to return call.

## 2021-06-06 NOTE — Telephone Encounter (Signed)
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    She is to continue Spiriva 2.5 MCG 2 puffs daily and we will increase her Advair dosing to 2 30-21 MCG 2 puffs twice daily given her ongoing shortness of breath.  We again discussed the importance of smoking cessation in regards to her lung health.  Improving her symptoms of cough, wheezing and shortness of breath.   I encouraged her to talk with her primary care physician about a referral to a psychiatrist or psychologist for further counseling given her significant anxiety.   She continues to wait for a CPAP machine for her obstructive sleep apnea.  I believe this will significantly help her fatigue once initiated.   CT chest scan 03/20/2021 showed stable right-sided pulmonary nodules.  Normal annual lung cancer screening can be resumed with a follow-up scan planned for June 2023.   Follow up visit in 6 months.   Freda Jackson, MD [facility] Office: [phone]       Current Outpatient Medications:    albuterol (VENTOLIN HFA) 108 (90 Base) MCG/ACT inhaler, Inhale 2 puffs into the lungs every 6 (six) hours as needed for wheezing or shortness of breath., Disp: 24 g, Rfl: 2   Aspirin-Acetaminophen-Caffeine (GOODY HEADACHE PO), Take 1 tablet by mouth as needed., Disp: , Rfl:    DULoxetine (CYMBALTA) 60 MG capsule, Take 60 mg by mouth 2 (two) times daily. , Disp: , Rfl:    fluticasone-salmeterol (ADVAIR HFA) 230-21 MCG/ACT inhaler, Inhale 2  puffs into the lungs 2 (two) times daily., Disp: 1 each, Rfl: 12   gabapentin (NEURONTIN) 300 MG capsule, Take 300 mg by mouth 3 (three) times daily as needed., Disp: , Rfl:    ipratropium-albuterol (DUONEB) 0.5-2.5 (3) MG/3ML SOLN, TAKE 3 MLS BY NEBULIZATION EVERY 4 (FOUR) HOURS AS NEEDED., Disp: 360 mL, Rfl: 3   loratadine (CLARITIN) 10 MG tablet, Take 10 mg by mouth daily., Disp: , Rfl:    mometasone (NASONEX) 50 MCG/ACT nasal spray, Place 2 sprays into the nose daily., Disp: , Rfl:    pantoprazole (PROTONIX) 40 MG tablet, Take 1 tablet by mouth 2 (two) times daily before a meal. , Disp: , Rfl:    Sodium Bicarbonate-Citric Acid (ALKA-SELTZER HEARTBURN PO), Take 1 tablet by mouth as needed (heartburn)., Disp: , Rfl:    SPIRIVA RESPIMAT 2.5 MCG/ACT AERS, 2 puffs daily., Disp: , Rfl:    valsartan-hydrochlorothiazide (DIOVAN-HCT) 160-12.5 MG tablet, Take 1 tablet by mouth daily., Disp: , Rfl:    vitamin B-12 (CYANOCOBALAMIN) 1000 MCG tablet, Take 1,000 mcg by mouth daily., Disp: , Rfl:    Vitamin D, Ergocalciferol, (DRISDOL) 1.25 MG (50000 UT) CAPS capsule, Take 1 capsule by mouth once a week. tuesdays, Disp: , Rfl:           Patient Instructions by [name], MD at 05/08/2021 11:45 AM  Author: [name], MD Author Type: Physician Filed: 05/08/2021 12:48 PM  Note Status: Addendum Cosign: Cosign Not Required Encounter Date: 05/08/2021  Editor: [name],  MD (Physician)      Prior Versions: 1. [name], MD (Physician) at 05/08/2021 12:47 PM - Addendum   2. [name], MD (Physician) at 05/08/2021 12:46 PM - Signed    Continue advair 230-12mg 2 puffs twice daily.  - rinse mouth out after each use   Continue Spiriva 2 puffs daily   Recommend smoking cessation or reduction with nicotine patches/gum/lozenges.        Orthostatic Vitals Recorded in This Encounter   05/08/2021  1215     BP Location: Left Arm  Cuff Size: Normal    Instructions    Return in about 6 months (around 11/08/2021).  Continue advair 230-263m 2 puffs twice daily.  - rinse mouth out after each use   Continue Spiriva 2 puffs daily   Recommend smoking cessation or reduction with nicotine patches/gum/lozenges.         Reason for call:  Chest tightness, head congestion, productive cough with yellow/brown mucous for over a week.  Denies any fever, chills or body aches.  Took a home covid test that was negative.  She is taking musinex that helps, but when it wears off her chest is tight again.  Using her Advair and Spiriva as prescribed.  Using albuterol inhaler daily for chest tightness, relieves it, but becomes tight again as soon as she gets up and moves around.  She has used Robitussin in the past for cough and it has not helped.  Dr. DeErin Fullingplease advise.  Thank you.   (examples of things to ask: : When did symptoms start? Fever? Cough? Productive? Color to sputum? More sputum than usual? Wheezing? Have you needed increased oxygen? Are you taking your respiratory medications? What over the counter measures have you tried?)  Allergies  Allergen Reactions   Doxycycline Photosensitivity    Broke out with blisters after being in the sun   Banana Hives    Immunization History  Administered Date(s) Administered   Influenza Inj Mdck Quad Pf 07/10/2019   Influenza Split 08/22/2018   Influenza,inj,Quad PF,6+ Mos 07/17/2017   Moderna Sars-Covid-2 Vaccination 01/06/2020, 02/01/2020, 10/03/2020   Pneumococcal Conjugate-13 12/23/2019   Pneumococcal Polysaccharide-23 10/30/2017

## 2021-06-06 NOTE — Telephone Encounter (Signed)
Please send in augmentin '875mg'$  BID for 7 days and extended steroid taper:  Prednisone taper: '40mg'$  x 3 days '30mg'$  x 3 days '20mg'$  x 3 days '10mg'$  x 3 days  Thanks, Jon

## 2021-06-06 NOTE — Telephone Encounter (Signed)
Spoke with the pt and notified of response per Dr Erin Fulling  Pt verbalized understanding  Rxs were sent to pharm

## 2021-06-06 NOTE — Telephone Encounter (Signed)
See phone encounter from today as it is a duplicate to this message.

## 2021-08-07 ENCOUNTER — Other Ambulatory Visit: Payer: Self-pay

## 2021-08-07 ENCOUNTER — Telehealth (INDEPENDENT_AMBULATORY_CARE_PROVIDER_SITE_OTHER): Payer: 59 | Admitting: Primary Care

## 2021-08-07 ENCOUNTER — Encounter: Payer: Self-pay | Admitting: Primary Care

## 2021-08-07 DIAGNOSIS — J441 Chronic obstructive pulmonary disease with (acute) exacerbation: Secondary | ICD-10-CM

## 2021-08-07 MED ORDER — PREDNISONE 10 MG PO TABS: ORAL_TABLET | ORAL | 0 refills | Status: DC

## 2021-08-07 MED ORDER — AMOXICILLIN-POT CLAVULANATE 875-125 MG PO TABS: 1.0000 | ORAL_TABLET | Freq: Two times a day (BID) | ORAL | 0 refills | Status: DC

## 2021-08-07 NOTE — Patient Instructions (Addendum)
Pred taper as directed Augmentin as directed

## 2021-08-07 NOTE — Telephone Encounter (Signed)
Called and spoke with patient due to the nature of her mychart message. She has been scheduled for a video visit this afternoon with Beth. She verbalized understanding of instructions.  Nothing further needed at time of call.

## 2021-08-07 NOTE — Progress Notes (Signed)
Virtual Visit via Video Note  I connected with Jillian [name] on 08/07/21 at  2:00 PM EDT by a video enabled telemedicine application and verified that I am speaking with the correct person using two identifiers.  Location: Patient: Home Provider: Office   I discussed the limitations of evaluation and management by telemedicine and the availability of in person appointments. The patient expressed understanding and agreed to proceed.  History of Present Illness: 55 year old female, current smoker. PMH significant for OSA, COPD-asthma overlap, emphysema, pulmonary nodules, chronic hypoxemic respiratory failure, tobacco abuse. Patient of Dr. Erin Fulling. Maintained on Advair + Spiriva, prn albuterol. PFTs ordered. If symptoms persist recommend sputum cultures.   Previous [facility] pulmonary encounter: 05/08/21- Dr. Elliot Gault Hallmark is a 55 year old woman, daily smoker with chronic hypoxemic respiratory failure in setting of COPD/Emphysema on 3L of home O2 who returns to pulmonary clinic for follow up.   She reports her breathing is doing okay at this time.  She continues on Advair 1 15-21 MCG 2 puffs twice daily and Spiriva Respimat 2.5 MCG 2 puffs daily with as needed DuoNebs.  She did receive a course of prednisone and azithromycin last month for COPD exacerbation.  She continues to smoke 1 pack of cigarettes per day and and is having a difficult time cutting back or even considering quitting. Anxiety is contributing to her difficulty quitting.  She has not received a CPAP machine yet since being diagnosed with obstructive sleep apnea.  She continues to complain of significant fatigue on a daily basis.  08/07/2021- Interim hx  Patient contacted today for virtual video visit. She reports having symptoms of np/congested cough, chest tightness and wheezing x 1 week. She received her covid booster on 10/11 and influenza vaccine on 10/20 . Maintained on Advair HFA and Spiriva respimat. She has been using her  Albuterol rescue inhaler 4 times a day since getting sick with some temporary improvement. She is taking mucinex without much relief. She only uses oxygen as needed, not currently requiring but does not have pulse oximeter at home to check readings. Denies fever, chills, sweats, chest pain or hemoptysis.       Observations/Objective:  - Appears well;  - She is not able to check oxygen level   Assessment and Plan:   COPD exacerbation: - Patient complains of NP cough with associated chest tightness and wheezing x 1 week. Her last flare up was in August 2022.  - Sending in RX Augmentin 1 tab BID x 7 days + prednisone taper (40mg  x 3 days; 30mg  x 3 days; 20mg  x 3 days; 10mg  x 3 days) - Continue Advair two puffs twice daily; Spiriva Respimat 2.61mcg two puffs daily; prn albuterol hfa 2 puffs q 4-6 hours as needed for breakthrough sob/wheezing AND mucinex 600mg  BID until congestion is better  - Advised she get pulse oximeter to check O2 readings and notify office if <88% RA    Pulmonary nodules:  - CT chest scan 03/20/2021 showed stable right-sided pulmonary nodules.  Normal annual lung cancer screening can be resumed with a follow-up scan planned for June 2023.  Follow Up Instructions:  - Return is symptoms do not improve or worsen with CXR    I discussed the assessment and treatment plan with the patient. The patient was provided an opportunity to ask questions and all were answered. The patient agreed with the plan and demonstrated an understanding of the instructions.   The patient was advised to call  back or seek an in-person evaluation if the symptoms worsen or if the condition fails to improve as anticipated.  I provided 22 minutes of non-face-to-face time during this encounter.   Martyn Ehrich, NP

## 2021-08-22 NOTE — Telephone Encounter (Signed)
JD please advise. Thanks  [facility] Pool Hi. My chest is tight again. My head is better but I worked out in the yard this past weekend and its finally catching up to me. Can Dr. Erin Fulling please call me in a Z-pak.  Thank you

## 2021-08-23 ENCOUNTER — Other Ambulatory Visit: Payer: Self-pay | Admitting: Pulmonary Disease

## 2021-08-23 MED ORDER — AZITHROMYCIN 250 MG PO TABS: ORAL_TABLET | ORAL | 0 refills | Status: DC

## 2021-08-24 NOTE — Telephone Encounter (Signed)
JD please advise. Thanks  

## 2021-08-25 MED ORDER — PREDNISONE 10 MG PO TABS: ORAL_TABLET | ORAL | 0 refills | Status: DC

## 2021-08-25 NOTE — Telephone Encounter (Signed)
[name], MD  Elie Confer, LPN Please send in prednisone taper:  40mg  x 3 days  30mg  x 3 days  20mg  x 3 days  10mg  x 3 days   Thanks,  Wille Glaser     This has been sent to the pharmacy for the pt.  I have made the pt aware.

## 2021-09-13 ENCOUNTER — Encounter: Payer: Self-pay | Admitting: Pulmonary Disease

## 2021-09-13 ENCOUNTER — Other Ambulatory Visit: Payer: Self-pay

## 2021-09-13 MED ORDER — ALBUTEROL SULFATE HFA 108 (90 BASE) MCG/ACT IN AERS: 2.0000 | INHALATION_SPRAY | Freq: Four times a day (QID) | RESPIRATORY_TRACT | 3 refills | Status: DC | PRN

## 2021-09-13 NOTE — Telephone Encounter (Signed)
Sent Albuterol inhaler refill into [facility] for pt. Nothing further needed at this time.

## 2021-11-04 ENCOUNTER — Encounter: Payer: Self-pay | Admitting: Pulmonary Disease

## 2021-11-06 NOTE — Telephone Encounter (Signed)
Please advise on Mychart?   

## 2021-11-07 ENCOUNTER — Other Ambulatory Visit: Payer: Self-pay

## 2021-11-07 ENCOUNTER — Telehealth: Payer: Self-pay | Admitting: Pulmonary Disease

## 2021-11-07 DIAGNOSIS — G4733 Obstructive sleep apnea (adult) (pediatric): Secondary | ICD-10-CM

## 2021-11-07 NOTE — Telephone Encounter (Signed)
There is order from April 2022 for ONO with CPAP.  Please reorder this.  Please change her appointment with me for 2 months after she received her CPAP machine.

## 2021-11-07 NOTE — Telephone Encounter (Signed)
New order for ONO on CPAP placed. Pt scheduled for January 12 2022. Nothing further needed,

## 2021-11-07 NOTE — Telephone Encounter (Signed)
Called and spoke to patient she received new CPAP last week and has not had ONO(?) yet. States Dr. Halford Chessman wanted her to have an ONO before next OV so patient wanted to reschedule for next available (March 2023).   Dr. Halford Chessman please advise if you still want patient to have an ono done. If so, I will contact [facility] and see if a new order needs to be placed since last one was placed in April 2022

## 2021-11-09 ENCOUNTER — Ambulatory Visit (INDEPENDENT_AMBULATORY_CARE_PROVIDER_SITE_OTHER): Payer: 59 | Admitting: Pulmonary Disease

## 2021-11-09 ENCOUNTER — Other Ambulatory Visit: Payer: Self-pay

## 2021-11-09 ENCOUNTER — Encounter: Payer: Self-pay | Admitting: Pulmonary Disease

## 2021-11-09 ENCOUNTER — Telehealth: Payer: Self-pay | Admitting: Pulmonary Disease

## 2021-11-09 VITALS — BP 112/72 | HR 86 | Ht 62.0 in | Wt 180.6 lb

## 2021-11-09 DIAGNOSIS — F17210 Nicotine dependence, cigarettes, uncomplicated: Secondary | ICD-10-CM

## 2021-11-09 DIAGNOSIS — J449 Chronic obstructive pulmonary disease, unspecified: Secondary | ICD-10-CM

## 2021-11-09 DIAGNOSIS — J9611 Chronic respiratory failure with hypoxia: Secondary | ICD-10-CM | POA: Diagnosis not present

## 2021-11-09 DIAGNOSIS — G4733 Obstructive sleep apnea (adult) (pediatric): Secondary | ICD-10-CM | POA: Diagnosis not present

## 2021-11-09 NOTE — Patient Instructions (Addendum)
Continue advair 230-15mcg 2 puffs twice daily.  - rinse mouth out after each use   Continue Spiriva 2 puffs daily   Recommend smoking cessation or reduction with nicotine patches/gum/lozenges.   Recommend establishing care with a counselor to work on depression/anxiety to help with the smoking cessation.  Consider taking Gabapentin an hour before bed time.

## 2021-11-09 NOTE — Telephone Encounter (Signed)
Please advise on medication change to Advair Disc 500.

## 2021-11-09 NOTE — Progress Notes (Signed)
Synopsis: Returns to clinic for follow up of COPD, former Dr. Ernest Mallick patient  Subjective:   PATIENT ID: Jillian [name] GENDER: female DOB: 26-Mar-1966, MRN: [id number]  HPI  Chief Complaint  Patient presents with   Follow-up    6 mo f/u for COPD. Increased wheezing, especially in the mornings. Has been using her nebs every 4 hours for relief.    Jillian [name] is a 56 year old woman, daily smoker with chronic hypoxemic respiratory failure in setting of COPD/Emphysema on 3L of home O2 who returns to pulmonary clinic for follow up.   She has been on Advair 230-21 MCG 2 puffs twice daily and Spiriva Respimat 2.5 MCG 2 puffs daily with as needed DuoNebs. She continues to have issues with dyspnea. She reports morning time wheezing.  She continues to smoke 1 pack of cigarettes per day and and is having a difficult time cutting back or even considering quitting. Anxiety is contributing to her difficulty quitting.  She is using CPAP nightly but complains of neck pain from the mask straps. She is having an overnight oximitry test performed in the future.    Past Medical History:  Diagnosis Date   Anxiety    Arthritis    Asthma    Depression    High blood pressure    History of bladder problems    Migraines    Sleep apnea      Family History  Problem Relation Age of Onset   Diabetes Mother    COPD Mother    Heart attack Father    Diabetes Sister    Colon cancer Neg Hx    Celiac disease Neg Hx    Inflammatory bowel disease Neg Hx      Social History   Socioeconomic History   Marital status: Divorced    Spouse name: Not on file   Number of children: 1   Years of education: Not on file   Highest education level: High school graduate  Occupational History   Occupation: Energy manager: [name]    Comment: currently on short term disability  Tobacco Use   Smoking status: Every Day    Packs/day: 1.00    Years: 55.00    Pack years: 55.00    Types:  Cigarettes   Smokeless tobacco: Never   Tobacco comments:    smokes 1 pack per day 08/07/2021  Vaping Use   Vaping Use: Never used  Substance and Sexual Activity   Alcohol use: Yes    Comment: hardly ever   Drug use: No   Sexual activity: Not on file  Other Topics Concern   Not on file  Social History Narrative   Not on file   Social Determinants of Health   Financial Resource Strain: Not on file  Food Insecurity: Not on file  Transportation Needs: Not on file  Physical Activity: Not on file  Stress: Not on file  Social Connections: Not on file  Intimate Partner Violence: Not on file     Allergies  Allergen Reactions   Doxycycline Photosensitivity    Broke out with blisters after being in the sun   Banana Hives     Outpatient Medications Prior to Visit  Medication Sig Dispense Refill   ADVAIR HFA 230-21 MCG/ACT inhaler INHALE 2 PUFFS INTO THE LUNGS TWICE A DAY 12 each 12   albuterol (VENTOLIN HFA) 108 (90 Base) MCG/ACT inhaler Inhale 2 puffs into the lungs every 6 (six)  hours as needed for wheezing or shortness of breath. 72 g 3   Aspirin-Acetaminophen-Caffeine (GOODY HEADACHE PO) Take 1 tablet by mouth as needed.     DULoxetine (CYMBALTA) 60 MG capsule Take 60 mg by mouth 2 (two) times daily.      gabapentin (NEURONTIN) 300 MG capsule Take 300 mg by mouth 3 (three) times daily as needed.     ipratropium-albuterol (DUONEB) 0.5-2.5 (3) MG/3ML SOLN TAKE 3 MLS BY NEBULIZATION EVERY 4 (FOUR) HOURS AS NEEDED. 360 mL 3   loratadine (CLARITIN) 10 MG tablet Take 10 mg by mouth daily.     Magnesium 200 MG TABS Take 1 tablet by mouth daily.     metFORMIN (GLUCOPHAGE) 500 MG tablet Take by mouth 2 (two) times daily with a meal.     mometasone (NASONEX) 50 MCG/ACT nasal spray Place 2 sprays into the nose daily.     pantoprazole (PROTONIX) 40 MG tablet Take 1 tablet by mouth 2 (two) times daily before a meal.      rosuvastatin (CRESTOR) 20 MG tablet Take 20 mg by mouth daily.      Sodium Bicarbonate-Citric Acid (ALKA-SELTZER HEARTBURN PO) Take 1 tablet by mouth as needed (heartburn).     SPIRIVA RESPIMAT 2.5 MCG/ACT AERS 2 puffs daily.     valsartan-hydrochlorothiazide (DIOVAN-HCT) 160-12.5 MG tablet Take 1 tablet by mouth daily.     vitamin B-12 (CYANOCOBALAMIN) 1000 MCG tablet Take 1,000 mcg by mouth daily.     Vitamin D, Ergocalciferol, (DRISDOL) 1.25 MG (50000 UT) CAPS capsule Take 1 capsule by mouth once a week. tuesdays     azithromycin (ZITHROMAX) 250 MG tablet Take 2 today then 1 daily until gone 6 tablet 0   amoxicillin-clavulanate (AUGMENTIN) 875-125 MG tablet Take 1 tablet by mouth 2 (two) times daily. 14 tablet 0   predniSONE (DELTASONE) 10 MG tablet 4 x 3 days, 3 x 3 days, 2 x 3 days, 1 x 3 days and then stop 30 tablet 0   predniSONE (DELTASONE) 10 MG tablet Take 40 mg daily x 3 days, 30 mg daily x 3 days, 20 mg daily x 3 days, 10 mg daily x 3 days then stop 30 tablet 0   No facility-administered medications prior to visit.   Review of Systems  Constitutional:  Positive for malaise/fatigue. Negative for chills, fever and weight loss.  HENT:  Negative for congestion and sore throat.   Eyes: Negative.   Respiratory:  Positive for cough, sputum production, shortness of breath and wheezing. Negative for hemoptysis.   Cardiovascular:  Negative for chest pain, orthopnea, leg swelling and PND.  Gastrointestinal:  Negative for abdominal pain, heartburn, nausea and vomiting.  Genitourinary: Negative.   Musculoskeletal:  Negative for joint pain and myalgias.  Neurological:  Negative for dizziness, weakness and headaches.  Endo/Heme/Allergies: Negative.   Psychiatric/Behavioral:  The patient is nervous/anxious.    Objective:   Vitals:   11/09/21 0856  BP: 112/72  Pulse: 86  SpO2: 97%  Weight: 180 lb 9.6 oz (81.9 kg)  Height: 5\' 2"  (1.575 m)    Physical Exam Constitutional:      Appearance: She is not ill-appearing.  Eyes:     Conjunctiva/sclera:  Conjunctivae normal.     Pupils: Pupils are equal, round, and reactive to light.  Cardiovascular:     Rate and Rhythm: Normal rate and regular rhythm.     Pulses: Normal pulses.     Heart sounds: Normal heart sounds. No murmur heard. Pulmonary:  Effort: Pulmonary effort is normal.     Breath sounds: Decreased air movement present. No wheezing, rhonchi or rales.  Abdominal:     General: Bowel sounds are normal.     Palpations: Abdomen is soft.  Musculoskeletal:     Right lower leg: No edema.     Left lower leg: No edema.  Skin:    General: Skin is warm and dry.     Capillary Refill: Capillary refill takes less than 2 seconds.  Neurological:     Mental Status: She is alert.  Psychiatric:        Mood and Affect: Mood normal.        Behavior: Behavior normal.        Thought Content: Thought content normal.        Judgment: Judgment normal.    CBC    Component Value Date/Time   WBC 13.0 (H) 11/25/2019 1540   RBC 5.17 (H) 11/25/2019 1540   HGB 15.5 (H) 11/25/2019 1540   HGB 14.9 08/22/2017 0849   HCT 47.4 (H) 11/25/2019 1540   HCT 44.5 08/22/2017 0849   PLT 313.0 11/25/2019 1540   MCV 91.8 11/25/2019 1540   MCV 96 08/22/2017 0849   MCH 31.7 07/10/2019 1125   MCHC 32.6 11/25/2019 1540   RDW 15.5 11/25/2019 1540   RDW 14.7 08/22/2017 0849   LYMPHSABS 2.7 11/25/2019 1540   LYMPHSABS 2.3 08/22/2017 0849   MONOABS 1.1 (H) 11/25/2019 1540   EOSABS 1.2 (H) 11/25/2019 1540   EOSABS 1.0 (H) 08/22/2017 0849   BASOSABS 0.2 (H) 11/25/2019 1540   BASOSABS 0.1 08/22/2017 0849   Chest imaging: CT Chest 03/20/2021 Stable right-sided pulmonary nodules are noted, the largest measuring 6 mm. Follow-up unenhanced chest CT in 12 months is recommended to ensure stability  CT Chest WO Contrast 09/19/20 1. Pulmonary emphysema. 2. Background pattern of vague centrilobular pulmonary nodules with apical predominance, millimetric and unchanged. Findings may be indicative of respiratory  bronchiolitis correlate with any smoking history. 3. Multiple small pulmonary nodules, not changed since December of 2020 and most not changed since July of 2020. Consider additional six-month follow-up for the largest in the RIGHT lower lobe which is not well visualized but present on the study of May 13, 2019, perhaps due to differences in technique and slice thickness. 4. Other pulmonary nodules with benign and stable appearance. 5. aortic atherosclerosis.  PFT: PFT Results Latest Ref Rng & Units 01/18/2020  FVC-Pre L 1.58  FVC-Predicted Pre % 49  FVC-Post L 2.04  FVC-Predicted Post % 63  Pre FEV1/FVC % % 67  Post FEV1/FCV % % 70  FEV1-Pre L 1.06  FEV1-Predicted Pre % 41  FEV1-Post L 1.43  DLCO uncorrected ml/min/mmHg 13.53  DLCO UNC% % 69  DLCO corrected ml/min/mmHg 12.78  DLCO COR %Predicted % 65  DLVA Predicted % 71  TLC L 4.34  TLC % Predicted % 89  RV % Predicted % 144   Labs: 12/09/19 Alpha-1 Anti-trypsin 184mg /dL (83 - 199 mg/dL), Phenotype MM  Echo: 12/31/19  1. Left ventricular ejection fraction, by estimation, is >75%. The left  ventricle has hyperdynamic function. The left ventricle has no regional  wall motion abnormalities. Left ventricular diastolic parameters were  normal.   2. Right ventricular systolic function is normal. The right ventricular  size is normal. There is normal pulmonary artery systolic pressure. The  estimated right ventricular systolic pressure is 9.8 mmHg.   3. The mitral valve is grossly normal. No evidence of  mitral valve  regurgitation.   4. The aortic valve is tricuspid. Noncoronary cusp is mildly thickened.  Aortic valve regurgitation is not visualized.   5. The inferior vena cava is normal in size with greater than 50%  respiratory variability, suggesting right atrial pressure of 3 mmHg.  Sleep Studies: CPAP titration study 01/24/21 - Trial of CPAP therapy on 12 cm H2O with a Small Cushion size [employer] Respironics Minimal  Contact Dreamwear Under Nose Frame (S) mask and heated humidification. - She should have overnight oximetry performed while using CPAP.  If she still has oxygen desaturation with CPAP, then she might need a repeat in lab titration study to determine whether she would benefit from use of supplemental oxygen with CPAP therapy.  Split Night Sleep Study 11/21/20 - Severe obstructive sleep apnea occurred during this study (AHI = 51.0/h).  Significant REM and supine effect with REM AHI of 120/h and supine AHI 80.1/h.   - No significant central sleep apnea occurred during this study (CAI = 0.2/h). - Moderate oxygen desaturation was noted during this study (Min O2 = 74.0%). - The patient snored with moderate snoring volume. - EKG findings include PVCs. - Clinically significant periodic limb movements did not occur during sleep. Associated arousals were significant.  Assessment & Plan:   OSA and COPD overlap syndrome (HCC)  Chronic respiratory failure with hypoxia (HCC)  Cigarette smoker - Plan: Ambulatory Referral for Lung Cancer Scre  Discussion: Skylarr Liz is a 56 year old woman, daily smoker with chronic hypoxemic respiratory failure in setting of COPD-asthma overlap/Emphysema on 3L of home O2 who returns to pulmonary clinic for follow up.   She is to continue Spiriva 2.5 MCG 2 puffs daily and advair diskus 500-53mcg 1 puff twice daily as her insurance is not covering advair HFA.  We again discussed the importance of smoking cessation in regards to her lung health. I recommended she use 21mg  patch daily with as needed nicotine lozenges. I encouraged her to talk with her primary care physician about a referral to a psychiatrist or psychologist for further counseling given her significant anxiety.  She is to continue CPAP therapy for OSA.   CT chest scan 03/20/2021 showed stable right-sided pulmonary nodules.  Normal annual lung cancer screening can be resumed with a follow-up scan planned for June  2023.  Follow up visit in 6 months.  Jillian Jackson, MD [facility] Office: [phone]    Current Outpatient Medications:    ADVAIR HFA 230-21 MCG/ACT inhaler, INHALE 2 PUFFS INTO THE LUNGS TWICE A DAY, Disp: 12 each, Rfl: 12   albuterol (VENTOLIN HFA) 108 (90 Base) MCG/ACT inhaler, Inhale 2 puffs into the lungs every 6 (six) hours as needed for wheezing or shortness of breath., Disp: 72 g, Rfl: 3   Aspirin-Acetaminophen-Caffeine (GOODY HEADACHE PO), Take 1 tablet by mouth as needed., Disp: , Rfl:    DULoxetine (CYMBALTA) 60 MG capsule, Take 60 mg by mouth 2 (two) times daily. , Disp: , Rfl:    gabapentin (NEURONTIN) 300 MG capsule, Take 300 mg by mouth 3 (three) times daily as needed., Disp: , Rfl:    ipratropium-albuterol (DUONEB) 0.5-2.5 (3) MG/3ML SOLN, TAKE 3 MLS BY NEBULIZATION EVERY 4 (FOUR) HOURS AS NEEDED., Disp: 360 mL, Rfl: 3   loratadine (CLARITIN) 10 MG tablet, Take 10 mg by mouth daily., Disp: , Rfl:    Magnesium 200 MG TABS, Take 1 tablet by mouth daily., Disp: , Rfl:    metFORMIN (GLUCOPHAGE) 500  MG tablet, Take by mouth 2 (two) times daily with a meal., Disp: , Rfl:    mometasone (NASONEX) 50 MCG/ACT nasal spray, Place 2 sprays into the nose daily., Disp: , Rfl:    pantoprazole (PROTONIX) 40 MG tablet, Take 1 tablet by mouth 2 (two) times daily before a meal. , Disp: , Rfl:    rosuvastatin (CRESTOR) 20 MG tablet, Take 20 mg by mouth daily., Disp: , Rfl:    Sodium Bicarbonate-Citric Acid (ALKA-SELTZER HEARTBURN PO), Take 1 tablet by mouth as needed (heartburn)., Disp: , Rfl:    SPIRIVA RESPIMAT 2.5 MCG/ACT AERS, 2 puffs daily., Disp: , Rfl:    valsartan-hydrochlorothiazide (DIOVAN-HCT) 160-12.5 MG tablet, Take 1 tablet by mouth daily., Disp: , Rfl:    vitamin B-12 (CYANOCOBALAMIN) 1000 MCG tablet, Take 1,000 mcg by mouth daily., Disp: , Rfl:    Vitamin D, Ergocalciferol, (DRISDOL) 1.25 MG (50000 UT) CAPS capsule, Take 1 capsule by mouth once a week.  tuesdays, Disp: , Rfl:    fluticasone-salmeterol (ADVAIR DISKUS) 500-50 MCG/ACT AEPB, Inhale 1 puff into the lungs in the morning and at bedtime., Disp: 180 each, Rfl: 3

## 2021-11-10 ENCOUNTER — Ambulatory Visit: Payer: 59 | Admitting: Pulmonary Disease

## 2021-11-10 MED ORDER — FLUTICASONE-SALMETEROL 500-50 MCG/ACT IN AEPB: 1.0000 | INHALATION_SPRAY | Freq: Two times a day (BID) | RESPIRATORY_TRACT | 3 refills | Status: DC

## 2021-11-10 NOTE — Telephone Encounter (Signed)
She is seeing Dr. Erin Fulling for her COPD.  Will route message to Dr. Erin Fulling.

## 2021-11-10 NOTE — Telephone Encounter (Signed)
Advair diskus 500-62mcg 1 puff twice daily is ok to send prescription.  She was previously on advair hfa 230-76mcg 2 puffs twice daily and I sent in a refill prescription. Not sure why it isn't covered now.  Thanks, JD

## 2021-11-10 NOTE — Telephone Encounter (Signed)
Spoke with the pt and notified of response per Dr Erin Fulling  She verbalized understanding  Rx was sent

## 2021-11-13 ENCOUNTER — Encounter: Payer: Self-pay | Admitting: Pulmonary Disease

## 2021-11-14 ENCOUNTER — Encounter: Payer: Self-pay | Admitting: Pulmonary Disease

## 2021-11-14 MED ORDER — PREDNISONE 10 MG PO TABS: 40.0000 mg | ORAL_TABLET | Freq: Every day | ORAL | 0 refills | Status: AC

## 2021-11-14 NOTE — Telephone Encounter (Signed)
Received the following messages from patient:  "Please help. My head is completely stopped up and hurting, my chest is tight.  Taking Mucinex since Saturday night and it's not working. Wheezing alot and coughing up yellowish green stuff. No fever. Thank you"  She was asked if she had tested for flu or covid, this was her response:   "No covid or flu. Same symptoms i always get when I leave the house for Dr. Appt."  Dr. Erin Fulling, can you please advise? Thanks!

## 2021-11-16 ENCOUNTER — Encounter: Payer: Self-pay | Admitting: Pulmonary Disease

## 2021-11-17 ENCOUNTER — Other Ambulatory Visit: Payer: Self-pay

## 2021-11-17 MED ORDER — AZITHROMYCIN 250 MG PO TABS: ORAL_TABLET | ORAL | 0 refills | Status: DC

## 2021-11-21 ENCOUNTER — Other Ambulatory Visit: Payer: Self-pay | Admitting: Pulmonary Disease

## 2021-11-22 ENCOUNTER — Encounter: Payer: Self-pay | Admitting: Pulmonary Disease

## 2021-12-04 ENCOUNTER — Encounter: Payer: Self-pay | Admitting: Pulmonary Disease

## 2021-12-04 NOTE — Telephone Encounter (Signed)
Patient is inquiring about ONO results. I did not see any in her chart that had been scanned in, Dr. Halford Chessman have you received ONO results on this patient? If not I can reach out to [facility] to have them send them to the [place] office for review.

## 2021-12-10 ENCOUNTER — Telehealth: Payer: Self-pay | Admitting: Pulmonary Disease

## 2021-12-10 DIAGNOSIS — G4734 Idiopathic sleep related nonobstructive alveolar hypoventilation: Secondary | ICD-10-CM

## 2021-12-10 DIAGNOSIS — J449 Chronic obstructive pulmonary disease, unspecified: Secondary | ICD-10-CM

## 2021-12-10 DIAGNOSIS — G4733 Obstructive sleep apnea (adult) (pediatric): Secondary | ICD-10-CM

## 2021-12-10 NOTE — Telephone Encounter (Signed)
ONO with CPAP 11/23/21 >> test time 8 hrs 1 min.  Baseline SpO2 89%, SpO2 low 79%.  Spent 1 hr 43 min with an SpO2 < 88%.   Please let her know her oxygen level is still low at night even though she is using CPAP.  She needs another CPAP titration study to determine what level of oxygen therapy she needs at night.  If she is agreeable, then place order for CPAP titration study and specify in the instructions to start on CPAP at 10 cm H2O and add supplemental oxygen when needed.  Should be okay to do study at [facility].

## 2021-12-11 NOTE — Telephone Encounter (Signed)
ATC patient. LMTCB with [facility] office number  

## 2021-12-11 NOTE — Telephone Encounter (Signed)
Patient returned call and went over results with patient. She voiced understanding about results and why she would need a CPAP titration study done. Order placed. Confirmed for patient that it would be done at [facility]. Nothing further needed.

## 2021-12-12 ENCOUNTER — Encounter: Payer: Self-pay | Admitting: Pulmonary Disease

## 2021-12-13 ENCOUNTER — Other Ambulatory Visit: Payer: Self-pay | Admitting: Pulmonary Disease

## 2021-12-13 ENCOUNTER — Telehealth: Payer: Self-pay | Admitting: Pulmonary Disease

## 2021-12-13 MED ORDER — BUPROPION HCL ER (SR) 150 MG PO TB12: 150.0000 mg | ORAL_TABLET | Freq: Two times a day (BID) | ORAL | 2 refills | Status: DC

## 2021-12-13 NOTE — Telephone Encounter (Signed)
[facility] called pt to give her cpap titration appt info for 4/24.   ? ? ?She has follow up appt with VS on 3/31 and wants to know if she should reschedule that appt.   ?Only seen him once and O2 level was low on sleep study. Do you still want to see her or wait till after her cpap titration study?? ? ?Please advise Dr Halford Chessman  ?

## 2021-12-13 NOTE — Telephone Encounter (Signed)
Called and spoke with pt letting her know the info per VS and she verbalized understanding. Pt's appt has been cancelled. Nothing further needed. ?

## 2021-12-13 NOTE — Telephone Encounter (Signed)
Please advise if you are ok to send in Wellbutrin and what dose?  ? ? ?

## 2021-12-13 NOTE — Telephone Encounter (Signed)
She doesn't need to keep appointment on 01/12/22.  Will reschedule appointment after reviewing her sleep study on 02/05/22. ?

## 2021-12-14 ENCOUNTER — Other Ambulatory Visit: Payer: Self-pay | Admitting: Pulmonary Disease

## 2021-12-24 ENCOUNTER — Encounter: Payer: Self-pay | Admitting: Pulmonary Disease

## 2022-01-12 ENCOUNTER — Ambulatory Visit: Payer: 59 | Admitting: Pulmonary Disease

## 2022-01-18 ENCOUNTER — Ambulatory Visit (HOSPITAL_BASED_OUTPATIENT_CLINIC_OR_DEPARTMENT_OTHER): Payer: 59 | Attending: Pulmonary Disease | Admitting: Pulmonary Disease

## 2022-01-18 DIAGNOSIS — G4734 Idiopathic sleep related nonobstructive alveolar hypoventilation: Secondary | ICD-10-CM | POA: Diagnosis not present

## 2022-01-18 DIAGNOSIS — J449 Chronic obstructive pulmonary disease, unspecified: Secondary | ICD-10-CM | POA: Insufficient documentation

## 2022-01-18 DIAGNOSIS — G4733 Obstructive sleep apnea (adult) (pediatric): Secondary | ICD-10-CM | POA: Insufficient documentation

## 2022-01-19 ENCOUNTER — Other Ambulatory Visit: Payer: Self-pay | Admitting: Pulmonary Disease

## 2022-01-23 ENCOUNTER — Encounter: Payer: 59 | Admitting: Pulmonary Disease

## 2022-01-28 IMAGING — DX DG CHEST 2V
2 series · 2 of 2 positions shown · non-contrast
Comparison: 07/30/2019.

CLINICAL DATA: Productive cough. Dyspnea on exertion for 3 weeks.
History of asthma.

EXAM:
CHEST - 2 VIEW

[chest pa]
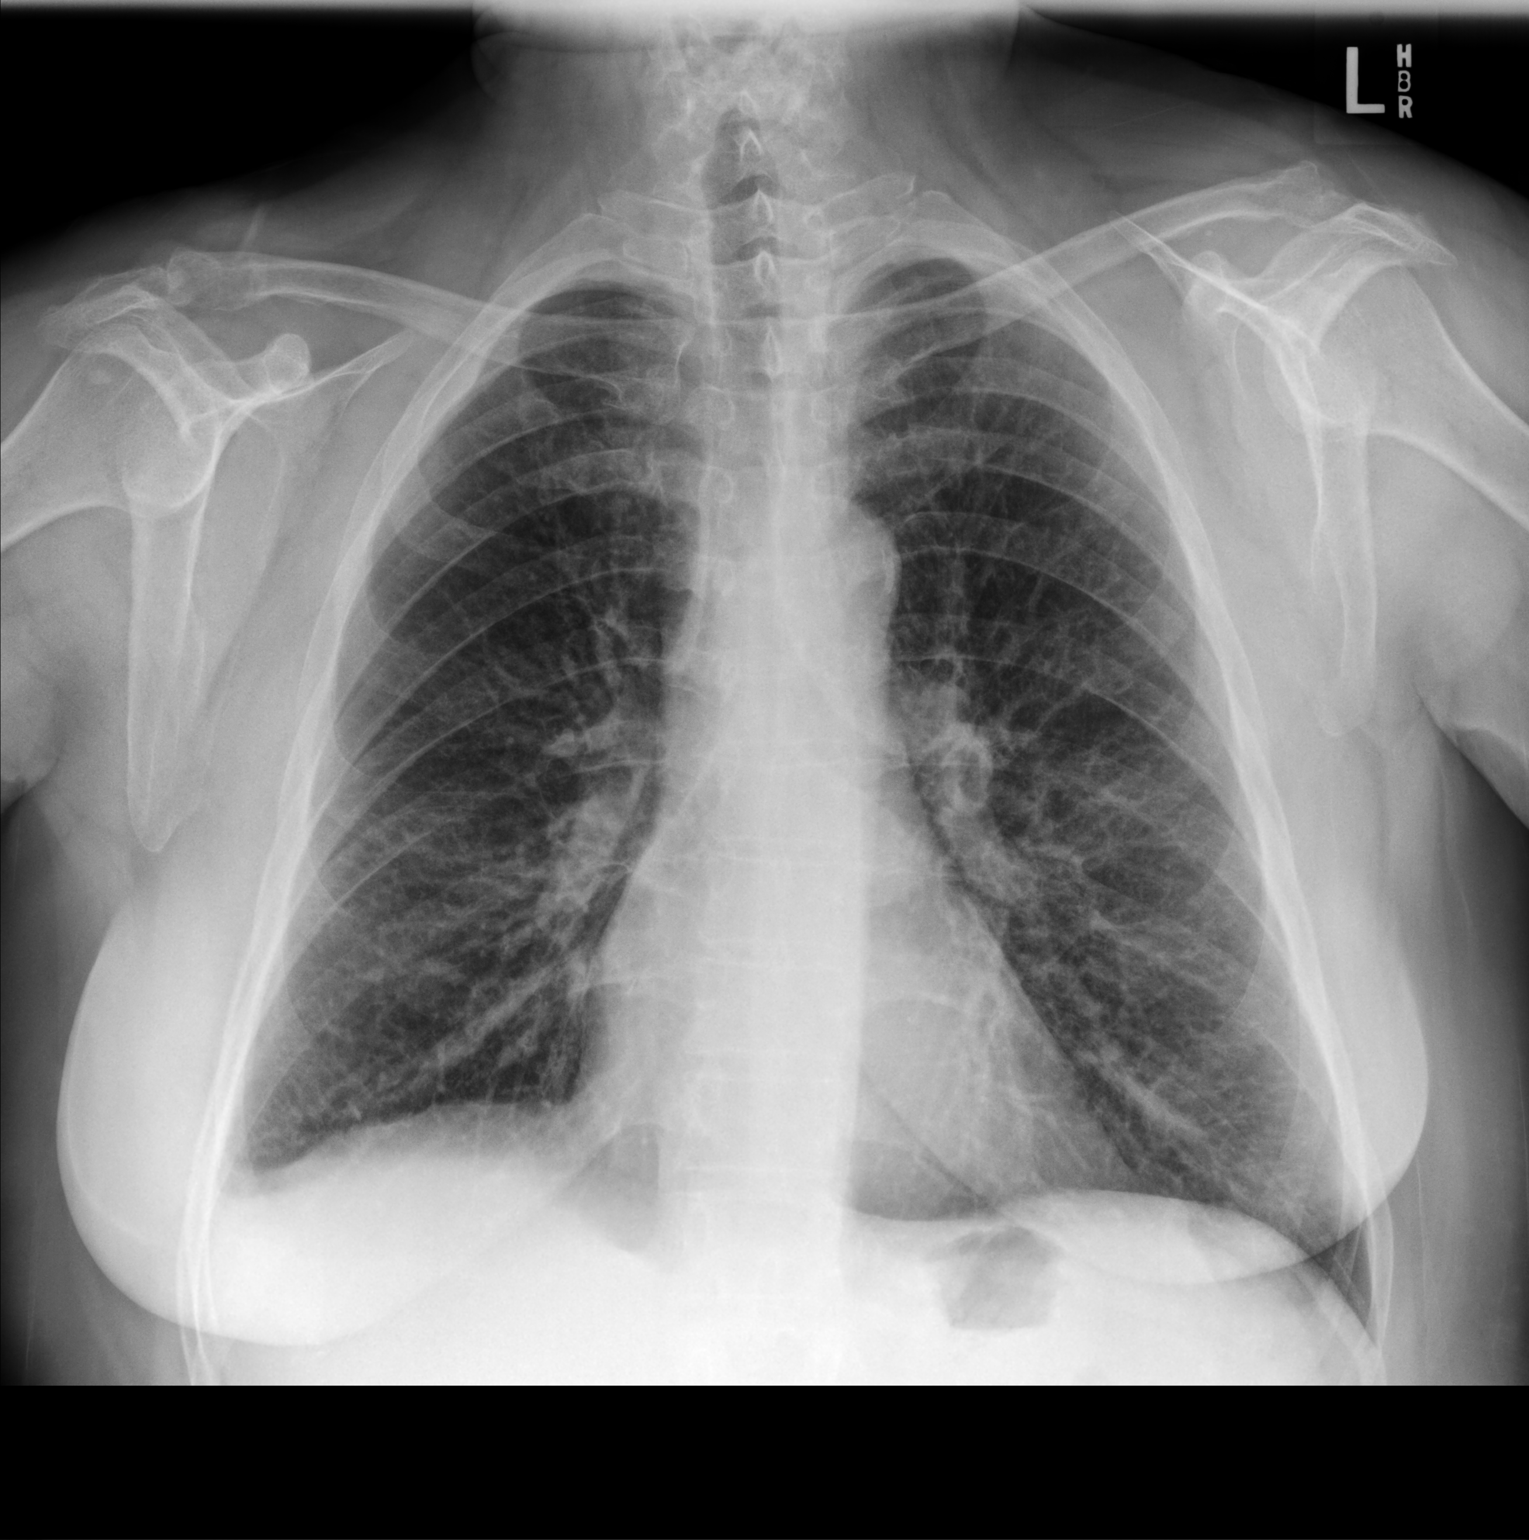

[chest lat]
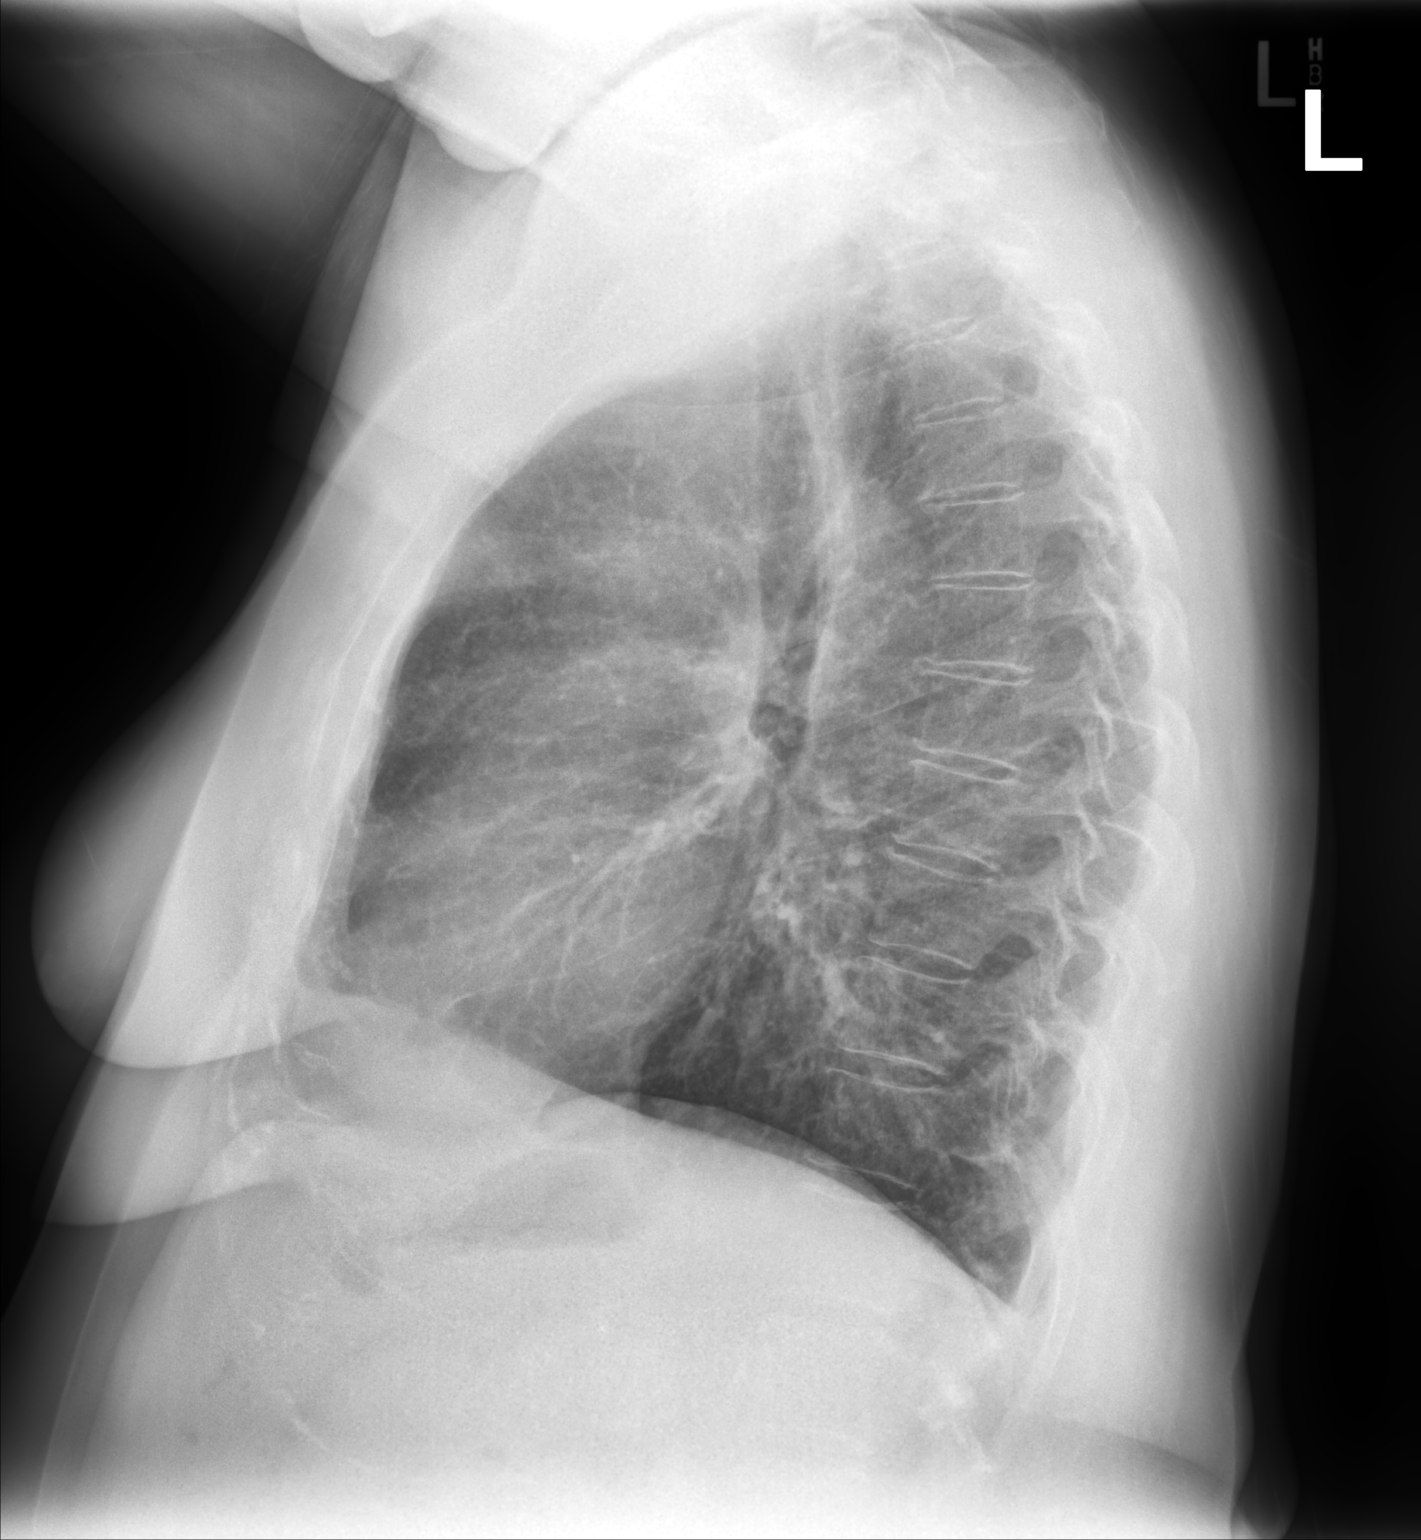

[2 of 2 positions shown; findings below may reference images not displayed]

FINDINGS: Cardiac silhouette is normal in size. No mediastinal or hilar
masses. No evidence of adenopathy.

Lungs are hyperexpanded with mild prominence of the bronchovascular
markings, unchanged from the prior study. No evidence of pneumonia
or pulmonary edema. No pleural effusion or pneumothorax.

Skeletal structures are unremarkable.
IMPRESSION: 1. No acute cardiopulmonary disease.
2. COPD.  Stable appearance from the prior study.

## 2022-01-29 ENCOUNTER — Encounter: Payer: Self-pay | Admitting: Pulmonary Disease

## 2022-01-30 ENCOUNTER — Telehealth: Payer: Self-pay | Admitting: Pulmonary Disease

## 2022-01-30 DIAGNOSIS — G4734 Idiopathic sleep related nonobstructive alveolar hypoventilation: Secondary | ICD-10-CM

## 2022-01-30 DIAGNOSIS — G4733 Obstructive sleep apnea (adult) (pediatric): Secondary | ICD-10-CM | POA: Diagnosis not present

## 2022-01-30 DIAGNOSIS — J449 Chronic obstructive pulmonary disease, unspecified: Secondary | ICD-10-CM

## 2022-01-30 MED ORDER — PREDNISONE 20 MG PO TABS: 40.0000 mg | ORAL_TABLET | Freq: Every day | ORAL | 0 refills | Status: DC

## 2022-01-30 MED ORDER — AZITHROMYCIN 250 MG PO TABS: ORAL_TABLET | ORAL | 0 refills | Status: DC

## 2022-01-30 NOTE — Telephone Encounter (Signed)
Called and spoke to pt. Informed her of the recs per Dr. Erin Fulling. Rxs sent to preferred pharmacy. Pt verbalized understanding and denied any further questions or concerns at this time.  ? ? ? ?

## 2022-01-30 NOTE — Procedures (Signed)
? ? ?  Patient Name: Jillian [name], Jillian [name] ?Study Date: 01/18/2022 ?Gender: Female ?D.O.B: December 30, 1965 ?Age (years): 43 ?Referring Provider: Freda Jackson ?Height (inches): 62 ?Interpreting Physician: Chesley Mires MD, ABSM ?Weight (lbs): 180 ?RPSGT: Baxter Flattery ?BMI: 33 ?MRN: [id number] ?Neck Size: 15.00 ? ?CLINICAL INFORMATION ?The patient is referred for a CPAP titration to treat sleep apnea. ? ?Date of NPSG 11/21/20: AHI 51, SpO2 low 74%. ? ?SLEEP STUDY TECHNIQUE ?As per the AASM Manual for the Scoring of Sleep and Associated Events v2.3 (April 2016) with a hypopnea requiring 4% desaturations. ? ?The channels recorded and monitored were frontal, central and occipital EEG, electrooculogram (EOG), submentalis EMG (chin), nasal and oral airflow, thoracic and abdominal wall motion, anterior tibialis EMG, snore microphone, electrocardiogram, and pulse oximetry. Continuous positive airway pressure (CPAP) was initiated at the beginning of the study and titrated to treat sleep-disordered breathing. ? ?MEDICATIONS ?Medications self-administered by patient taken the night of the study : N/A ? ?TECHNICIAN COMMENTS ?Comments added by technician: O2 initiated due to low sats. ?Comments added by scorer: N/A ? ?RESPIRATORY PARAMETERS ?Optimal PAP Pressure (cm): 12 AHI at Optimal Pressure (/hr): 0 ?Overall Minimal O2 (%): 86.0 Supine % at Optimal Pressure (%): 33 ?Minimal O2 at Optimal Pressure (%): 86.0  ? ?She had persistent oxygen desaturation lasting more than 5 minutes in the abscence of other respiratory events.  This improved once she had 2 liters oxygen applied with CPAP at 12 cm H2O. ? ?SLEEP ARCHITECTURE ?The study was initiated at 10:56:26 PM and ended at 4:57:19 AM. ? ?Sleep onset time was 159.7 minutes and the sleep efficiency was 28.7%%. The total sleep time was 103.7 minutes. ? ?The patient spent 1.4%% of the night in stage N1 sleep, 43.1%% in stage N2 sleep, 28.9%% in stage N3 and 26.5% in REM.Stage REM latency was  172.5 minutes ? ?Wake after sleep onset was 97.5. Alpha intrusion was absent. Supine sleep was 43.40%. ? ?CARDIAC DATA ?The 2 lead EKG demonstrated sinus rhythm. The mean heart rate was 84.3 beats per minute. Other EKG findings include: None. ? ?LEG MOVEMENT DATA ?The total Periodic Limb Movements of Sleep (PLMS) were 0. The PLMS index was 0.0. A PLMS index of <15 is considered normal in adults. ? ?IMPRESSIONS ?- The optimal PAP pressure was 12 cm of water. ?- She required 2 liters supplemental oxygen with CPAP. ?- She had difficulty initiating and maintaining sleep during the first portion of the study.  This improved once she was on adequate CPAP setting with supplemental oxygen. ? ?DIAGNOSIS ?- Obstructive Sleep Apnea (G47.33) ? ?RECOMMENDATIONS ?- Trial of CPAP therapy on 12 cm H2O with 2 liters supplemental oxygen. ?- She was fitted with a Medium size Resmed Full Face AirFit F30i mask and heated humidification. ?- Avoid alcohol, sedatives and other CNS depressants that may worsen sleep apnea and disrupt normal sleep architecture. ?- Sleep hygiene should be reviewed to assess factors that may improve sleep quality. ?- Weight management and regular exercise should be initiated or continued. ? ?[Electronically signed] 01/30/2022 06:58 AM ? ?Chesley Mires MD, ABSM ?Diplomate, Tax adviser of Sleep Medicine ?NPI: [id number] ? ?[place] ?PH: [phone]   FX: [phone] ?ACCREDITED BY THE AMERICAN ACADEMY OF SLEEP MEDICINE ? ?

## 2022-01-30 NOTE — Telephone Encounter (Signed)
Called and spoke to pt. Pt c/o chest congestion, chest tightness, wheezing, prod cough with little mucus production (green/brown in color), pt states it is difficult to bring the mucus up. Pt states her symptoms have been present  x 1 week. Pt states she is taking mucinex without relief. No fever. Pt states shes been using albuterol q4h x 3 days. Pt states advair and spiriva as prescribed. Pt requesting abx.  ? ?Dr. Erin Fulling, please advise.  ?

## 2022-01-30 NOTE — Telephone Encounter (Signed)
Called and spoke to patient. She voiced understanding of results. Order placed for supplemental O2 at night ?

## 2022-01-30 NOTE — Telephone Encounter (Signed)
CPAP titration 01/18/22 >> CPAP 12 with 2 liters O2 >> AHI 0 ? ? ?Please let her know that she needed 2 liters oxygen at night with CPAP.  Once she was on these settings her sleep improved.  Please send order to arrange for 2 liters supplemental oxygen to be used at night with CPAP.  She uses [facility] for her DME. ?

## 2022-01-31 ENCOUNTER — Other Ambulatory Visit: Payer: Self-pay | Admitting: Pulmonary Disease

## 2022-02-05 ENCOUNTER — Encounter: Payer: Self-pay | Admitting: Internal Medicine

## 2022-02-05 ENCOUNTER — Encounter: Payer: 59 | Admitting: Pulmonary Disease

## 2022-02-05 ENCOUNTER — Encounter: Payer: Self-pay | Admitting: Pulmonary Disease

## 2022-02-05 ENCOUNTER — Ambulatory Visit (INDEPENDENT_AMBULATORY_CARE_PROVIDER_SITE_OTHER): Payer: 59 | Admitting: Internal Medicine

## 2022-02-05 VITALS — BP 100/60 | HR 88 | Temp 99.0°F | Ht 61.0 in | Wt 181.2 lb

## 2022-02-05 DIAGNOSIS — J441 Chronic obstructive pulmonary disease with (acute) exacerbation: Secondary | ICD-10-CM | POA: Diagnosis not present

## 2022-02-05 DIAGNOSIS — F17210 Nicotine dependence, cigarettes, uncomplicated: Secondary | ICD-10-CM

## 2022-02-05 DIAGNOSIS — R918 Other nonspecific abnormal finding of lung field: Secondary | ICD-10-CM

## 2022-02-05 DIAGNOSIS — G4733 Obstructive sleep apnea (adult) (pediatric): Secondary | ICD-10-CM

## 2022-02-05 DIAGNOSIS — Z720 Tobacco use: Secondary | ICD-10-CM

## 2022-02-05 DIAGNOSIS — J449 Chronic obstructive pulmonary disease, unspecified: Secondary | ICD-10-CM | POA: Insufficient documentation

## 2022-02-05 MED ORDER — CEFDINIR 300 MG PO CAPS: 300.0000 mg | ORAL_CAPSULE | Freq: Two times a day (BID) | ORAL | 0 refills | Status: AC

## 2022-02-05 MED ORDER — PREDNISONE 10 MG PO TABS: ORAL_TABLET | ORAL | 0 refills | Status: DC

## 2022-02-05 MED ORDER — FLUCONAZOLE 150 MG PO TABS: 150.0000 mg | ORAL_TABLET | Freq: Every day | ORAL | 0 refills | Status: DC

## 2022-02-05 NOTE — Progress Notes (Signed)
02/05/22- 33 yoF Smoker (1.5 PPD) followed by Dr Halford Chessman for OSA/ O2, and by Dr Erin Fulling for Pulmonary.  ?Medical problem list includes HTN, Migraine, OSA,/ COPD overlap,Lung Nodules,   Arthritis,Tobacco Abuse,  ?Meds include Advair , Ventolin hfa, Zpak, Spiriva2.5,  ?CPAP 12   O2 2L  sleep/ [facility] ?-----still having some chest tightness and thinks she may have thrush. Also productive cough with yellow/brown sputum. ?She messaged today with persistent chest tightness and thrush. Asking for extension of recent ZPAK and prednisone, and for Rx for thrush. ?Still smoking a pack and half a day.  She does not feel she is at her baseline with residual chest tightness, cough productive of yellow-brown sputum but no fever or chest pain.  Persistent wheeze.  Mouth burns and tongue is coated. ?Comfortable with CPAP but asked help keeping mouth closed and we discussed using a chinstrap with her full facemask.   ? ?Prior to Admission medications   ?Medication Sig Start Date End Date Taking? Authorizing Provider  ?albuterol (VENTOLIN HFA) 108 (90 Base) MCG/ACT inhaler TAKE 2 PUFFS BY MOUTH EVERY 6 HOURS AS NEEDED FOR WHEEZE OR SHORTNESS OF BREATH 01/31/22  Yes [name], MD  ?Aspirin-Acetaminophen-Caffeine (GOODY HEADACHE PO) Take 1 tablet by mouth as needed.   Yes [provider]  ?buPROPion (WELLBUTRIN SR) 150 MG 12 hr tablet TAKE 1 TABLET (150 MG TOTAL) BY MOUTH 2 (TWO) TIMES DAILY. TAKE 1 TAB ONCE DAILY FOR 7 DAYS AND THEN INCREASE TO 2 TABLETS BID. 12/14/21  Yes [name], MD  ?cefdinir (OMNICEF) 300 MG capsule Take 1 capsule (300 mg total) by mouth 2 (two) times daily for 10 days. 02/05/22 02/15/22 Yes Kamilla Hands, Tarri Fuller D, MD  ?DULoxetine (CYMBALTA) 60 MG capsule Take 60 mg by mouth 2 (two) times daily.    Yes [provider]  ?fluconazole (DIFLUCAN) 150 MG tablet Take 1 tablet (150 mg total) by mouth daily. 02/05/22  Yes Forest Pruden, Tarri Fuller D, MD  ?fluticasone-salmeterol (ADVAIR DISKUS) 500-50 MCG/ACT AEPB  Inhale 1 puff into the lungs in the morning and at bedtime. 11/10/21  Yes [name], MD  ?gabapentin (NEURONTIN) 300 MG capsule Take 300 mg by mouth 3 (three) times daily as needed. 08/25/20  Yes [provider]  ?ipratropium-albuterol (DUONEB) 0.5-2.5 (3) MG/3ML SOLN TAKE 3 MLS BY NEBULIZATION EVERY 4 (FOUR) HOURS AS NEEDED. 04/28/21  Yes [name], MD  ?loratadine (CLARITIN) 10 MG tablet Take 10 mg by mouth daily.   Yes [provider]  ?Magnesium 200 MG TABS Take 1 tablet by mouth daily. 10/20/21  Yes [provider]  ?metFORMIN (GLUCOPHAGE) 500 MG tablet Take by mouth 2 (two) times daily with a meal.   Yes [provider]  ?mometasone (NASONEX) 50 MCG/ACT nasal spray Place 2 sprays into the nose daily. 10/18/20  Yes [provider]  ?pantoprazole (PROTONIX) 40 MG tablet Take 1 tablet by mouth 2 (two) times daily before a meal.  05/31/19  Yes [provider]  ?predniSONE (DELTASONE) 10 MG tablet 4 X 2 DAYS, 3 X 2 DAYS, 2 X 2 DAYS, 1 X 2 DAYS 02/05/22  Yes Ciji Boston D, MD  ?rosuvastatin (CRESTOR) 20 MG tablet Take 20 mg by mouth daily.   Yes [provider]  ?Sodium Bicarbonate-Citric Acid (ALKA-SELTZER HEARTBURN PO) Take 1 tablet by mouth as needed (heartburn).   Yes [provider]  ?SPIRIVA RESPIMAT 2.5 MCG/ACT AERS INHALE 2 PUFFS BY MOUTH INTO THE LUNGS DAILY 11/21/21  Yes  Chesley Mires, MD  ?valsartan-hydrochlorothiazide (DIOVAN-HCT) 160-12.5 MG tablet Take 1 tablet by mouth daily.   Yes [provider]  ?vitamin B-12 (CYANOCOBALAMIN) 1000 MCG tablet Take 1,000 mcg by mouth daily.   Yes [provider]  ?Vitamin D, Ergocalciferol, (DRISDOL) 1.25 MG (50000 UT) CAPS capsule Take 1 capsule by mouth once a week. tuesdays 04/17/19  Yes [provider]  ? ?Past Medical History:  ?Diagnosis Date  ? Anxiety   ? Arthritis   ? Asthma   ? Depression   ? High blood pressure   ? History of bladder problems   ?  Migraines   ? Sleep apnea   ? ?Past Medical History:  ?Diagnosis Date  ? Anxiety   ? Arthritis   ? Asthma   ? Depression   ? High blood pressure   ? History of bladder problems   ? Migraines   ? Sleep apnea   ? ?Family History  ?Problem Relation Age of Onset  ? Diabetes Mother   ? COPD Mother   ? Heart attack Father   ? Diabetes Sister   ? Colon cancer Neg Hx   ? Celiac disease Neg Hx   ? Inflammatory bowel disease Neg Hx   ? ?Social History  ? ?Socioeconomic History  ? Marital status: Divorced  ?  Spouse name: Not on file  ? Number of children: 1  ? Years of education: Not on file  ? Highest education level: High school graduate  ?Occupational History  ? Occupation: Freight forwarder  ?  Employer: [name]  ?  Comment: currently on short term disability  ?Tobacco Use  ? Smoking status: Every Day  ?  Packs/day: 1.00  ?  Years: 55.00  ?  Pack years: 55.00  ?  Types: Cigarettes  ? Smokeless tobacco: Never  ? Tobacco comments:  ?  Smokes a pack and a half a day [facility] 02/05/2022  ?Vaping Use  ? Vaping Use: Never used  ?Substance and Sexual Activity  ? Alcohol use: Yes  ?  Comment: hardly ever  ? Drug use: No  ? Sexual activity: Not on file  ?Other Topics Concern  ? Not on file  ?Social History Narrative  ? Not on file  ? ?Social Determinants of Health  ? ?Financial Resource Strain: Not on file  ?Food Insecurity: Not on file  ?Transportation Needs: Not on file  ?Physical Activity: Not on file  ?Stress: Not on file  ?Social Connections: Not on file  ?Intimate Partner Violence: Not on file  ? ?ROS-see HPI   + = positive ?Constitutional:    weight loss, night sweats, fevers, chills, fatigue, lassitude. ?HEENT:    headaches, difficulty swallowing, tooth/dental problems, sore throat,  ?     sneezing, itching, ear ache, nasal congestion, post nasal drip, snoring ?CV:    chest pain, orthopnea, PND, swelling in lower extremities, anasarca,                                  dizziness, palpitations ?Resp:   shortness of breath with  exertion or at rest.   ?             productive cough,   non-productive cough, coughing up of blood.   ?           change in color of mucus.  wheezing.   ?Skin:    rash or lesions. ?GI:  No-   heartburn, indigestion, abdominal pain, nausea, vomiting, diarrhea,  ?               change in bowel habits, loss of appetite ?GU: dysuria, change in color of urine, no urgency or frequency.   flank pain. ?MS:   joint pain, stiffness, decreased range of motion, back pain. ?Neuro-     nothing unusual ?Psych:  change in mood or affect.  depression or anxiety.   memory loss. ? ?OBJ- Physical Exam     +odor of tobacco ?General- Alert, Oriented, Affect-appropriate, Distress- none acute ?Skin- rash-none, lesions- none, excoriation- none ?Lymphadenopathy- none ?Head- atraumatic ?           Eyes- Gross vision intact, PERRLA, conjunctivae and secretions clear ?           Ears- Hearing, canals-normal ?           Nose- Clear, no-Septal dev, mucus, polyps, erosion, perforation  ?           Throat- Mallampati III , mucosa +coated white, drainage- none, tonsils- atrophic ?Neck- flexible , trachea midline, no stridor , thyroid nl, carotid no bruit ?Chest - symmetrical excursion , unlabored ?          Heart/CV- RRR , no murmur , no gallop  , no rub, nl s1 s2 ?                          - JVD- none , edema- none, stasis changes- none, varices- none ?          Lung- wheeze+mild, cough+light, dullness-none, rub- none ?          Chest wall-  ?Abd-  ?Br/ Gen/ Rectal- Not done, not indicated ?Extrem- cyanosis- none, clubbing, none, atrophy- none, strength- nl ?Neuro- grossly intact to observation ? ? ?

## 2022-02-05 NOTE — Telephone Encounter (Signed)
Dr. Dewald, please see mychart message sent by pt and advise. 

## 2022-02-05 NOTE — Assessment & Plan Note (Addendum)
Trying to keep her mouth closed.  Has full facemask but we can still let her try a chinstrap.  She will follow-up with Dr. Halford Chessman for this problem. ?

## 2022-02-05 NOTE — Assessment & Plan Note (Signed)
Encouraged effort to stop smoking.  We can get support once she is willing to try. ?

## 2022-02-05 NOTE — Assessment & Plan Note (Signed)
We discussed and need to ensure that she is signed up with the low-dose screening CT program. ?

## 2022-02-05 NOTE — Patient Instructions (Addendum)
Order- enroll in CT chest low dose screening program ? ?Scripts sent for Diflucan, cefdinir and prednisone ? ?Please try to cut down your smoking !! ? ?Please call if we can help ? ?Plan on seeing Dr Erin Fulling this summer ?

## 2022-02-05 NOTE — Telephone Encounter (Signed)
[name], MD  You 7 minutes ago (10:49 AM)  ? ?Patient should be seen by APP for acute visit and evaluation of thrush.  ? ?Thanks,  ?JD   ? ? ?Called and spoke with pt and have scheduled pt an appt with CY. Nothing further needed. ?

## 2022-02-05 NOTE — Assessment & Plan Note (Signed)
Slow to respond to therapy at least in part because of ongoing smoking as discussed. ?Plan-prednisone 8-day taper, Omnicef, continue bronchodilators ?

## 2022-03-01 ENCOUNTER — Encounter: Payer: Self-pay | Admitting: Pulmonary Disease

## 2022-03-02 ENCOUNTER — Other Ambulatory Visit: Payer: Self-pay | Admitting: *Deleted

## 2022-03-02 DIAGNOSIS — Z122 Encounter for screening for malignant neoplasm of respiratory organs: Secondary | ICD-10-CM

## 2022-03-02 DIAGNOSIS — Z87891 Personal history of nicotine dependence: Secondary | ICD-10-CM

## 2022-03-02 DIAGNOSIS — F17210 Nicotine dependence, cigarettes, uncomplicated: Secondary | ICD-10-CM

## 2022-03-07 ENCOUNTER — Encounter: Payer: Self-pay | Admitting: Pulmonary Disease

## 2022-03-07 NOTE — Telephone Encounter (Signed)
Dr. Erin Fulling please advise on the following My Chart message:  [facility] (supporting [name], MD) 1 hour ago (11:57 AM)   Hi! My chest is tight again. Dry and wet cough. Upper back hurts when I cough. No energy, sleeping most of the time. Feeling terrible fo about a week now. No fever, No Covid. I don't see Dr. Erin Fulling till the end of July. Can he please call me in something? Thank you  Thank you

## 2022-03-08 MED ORDER — AZITHROMYCIN 250 MG PO TABS: ORAL_TABLET | ORAL | 0 refills | Status: DC

## 2022-03-08 MED ORDER — PREDNISONE 10 MG PO TABS
40.0000 mg | ORAL_TABLET | Freq: Every day | ORAL | 0 refills | Status: AC
Start: 2022-03-08 — End: 2022-03-13

## 2022-03-08 NOTE — Telephone Encounter (Signed)
[name], MD  [facility] Triage Pool 14 hours ago (5:28 PM)   Please send in:  Pred '40mg'$  daily x 5 days  Zpak   Thanks,  JD    Both prescriptions have been sent in for patient and she has been made aware. Nothing further needed at this time.

## 2022-03-13 IMAGING — CT CT HEART MORP W/ CTA COR W/ SCORE W/ CA W/CM &/OR W/O CM
4 of 7 series · 8 of 20 positions shown, 9 images · IV contrast (APPLIED)
Comparison: Chest CT 09/30/2019.
COMPARISON: Chest CT 09/30/2019.

Addendum:
EXAM:
OVER-READ INTERPRETATION  CT CHEST

The following report is an over-read performed by radiologist Dr.
Petter Soo [REDACTED] on 01/08/2020. This
over-read does not include interpretation of cardiac or coronary
anatomy or pathology. The coronary calcium score/coronary CTA
interpretation by the cardiologist is attached.
CLINICAL DATA: Chest pain
Cardiac CTA
MEDICATIONS:
Sub lingual nitro. 4 mg
TECHNIQUE: The patient was scanned on a Siemens Force 192 scanner. Gantry
rotation speed was 250 msecs. Collimation was. 6 mm . A 120 kV
prospective scan was triggered in the ascending thoracic aorta at
140 HU's with full mA between 30-70% of the R-R interval . Average
HR during the scan was 61 bpm. The 3D data set was interpreted on a
dedicated work station using MPR, MIP and VRT modes. A total of 80
cc of contrast was used.

[Series 6: best diast 73 % · axial · 0.39mm/px · z∈[+1078,+1116]mm · 2 of 293 slices shown, 3 images]
[im 98/293  vessel]
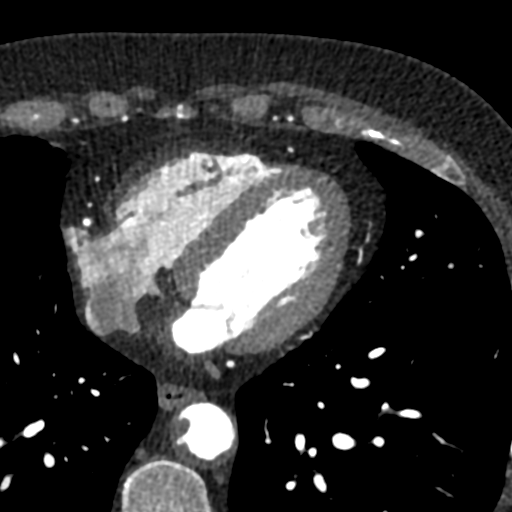
[im 98/293  lung]
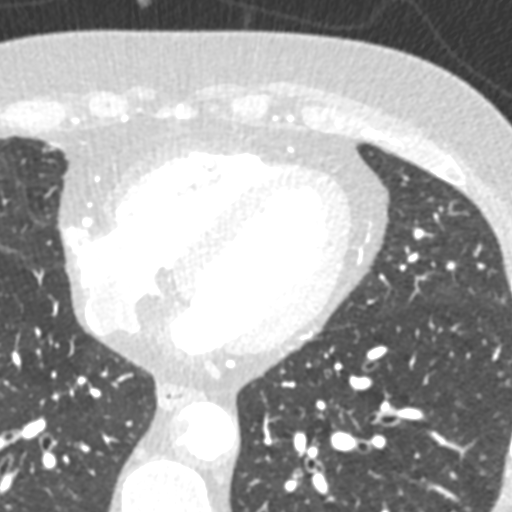
[im 195/293  vessel]
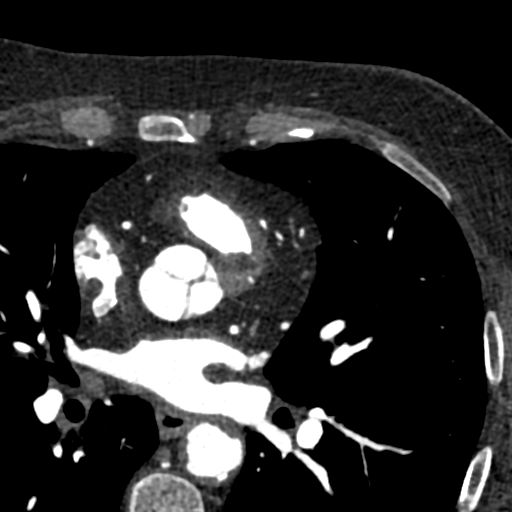

[Series 7: best syst 30 % · axial · 0.39mm/px · z∈[+1078,+1116]mm · 2 of 293 slices shown]
[im 98/293  vessel]
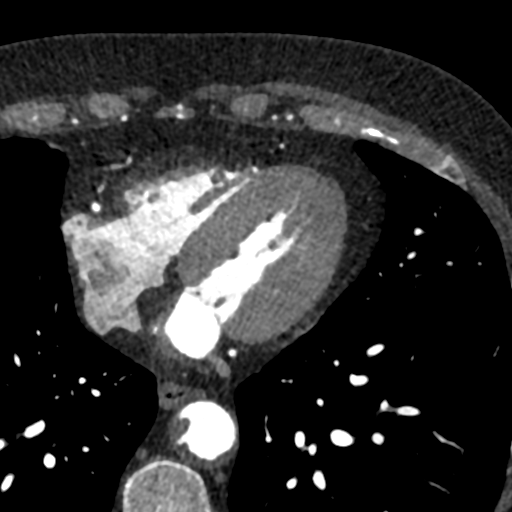
[im 195/293  vessel]
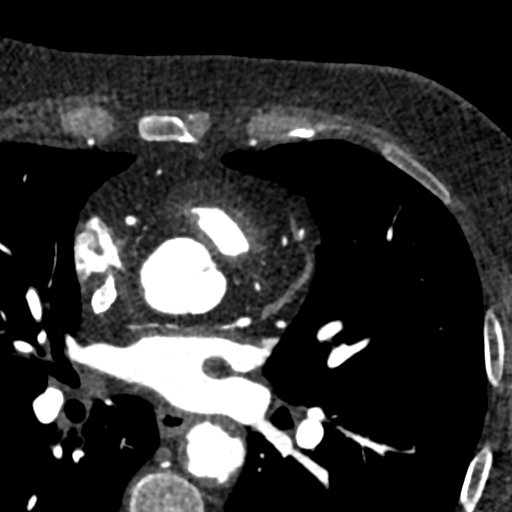

[Series 8: ts diast sharp 30 % · axial · 0.39mm/px · z∈[+1078,+1116]mm · 2 of 293 slices shown]
[im 98/293  lung]
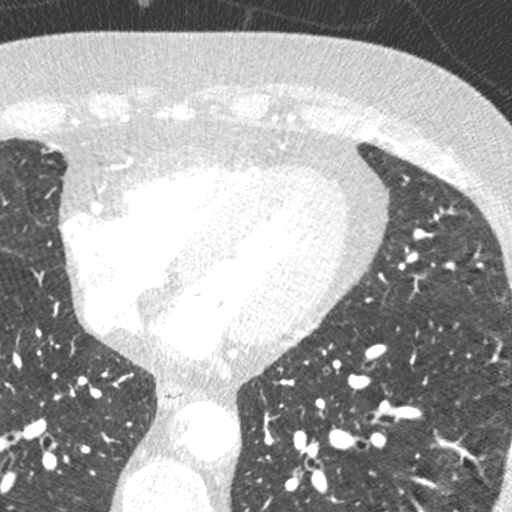
[im 195/293  lung]
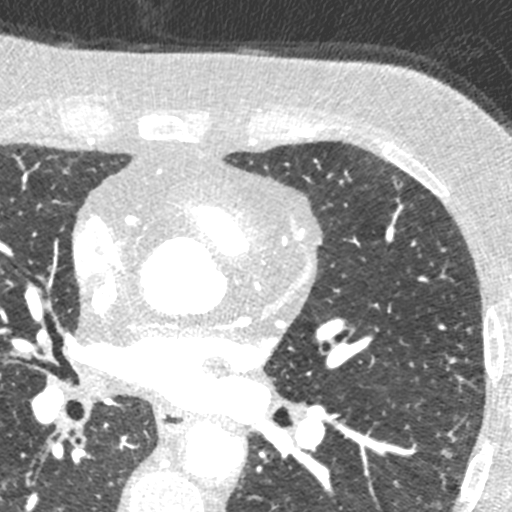

[Series 9: ts syst sharp 30 % · axial · 0.39mm/px · z∈[+1078,+1116]mm · 2 of 293 slices shown]
[im 98/293  lung]
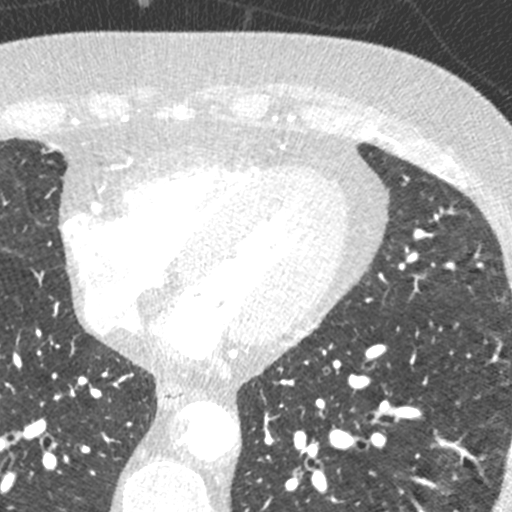
[im 195/293  lung]
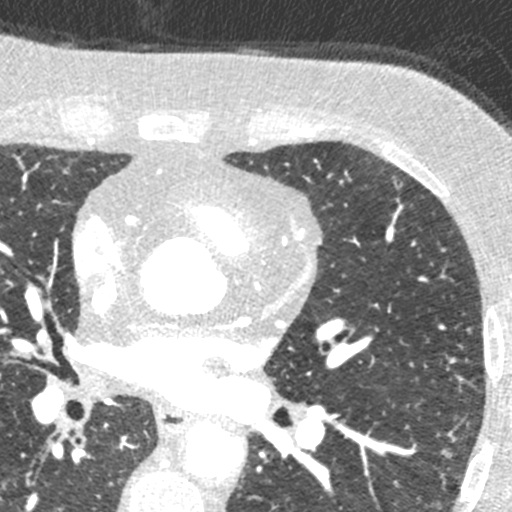

[8 of 20 positions shown; findings below may reference images not displayed]

FINDINGS: Aortic atherosclerosis. 8 x 5 mm pulmonary nodule in the subpleural
aspect of the right middle lobe (axial image 24 of series 12),
stable to slightly decreased in size compared to the prior study,
likely a subpleural lymph node. Within the visualized portions of
the thorax there are no suspicious appearing pulmonary nodules or
masses, there is no acute consolidative airspace disease, no pleural
effusions, no pneumothorax and no lymphadenopathy. Visualized
portions of the upper abdomen are unremarkable. There are no
aggressive appearing lytic or blastic lesions noted in the
visualized portions of the skeleton.
IMPRESSION: 1. Small pulmonary nodule in the subpleural aspect of the right
middle lobe, similar to prior studies dating back to 05/13/2019,
statistically likely a subpleural lymph node.
2.  Aortic Atherosclerosis (QAQVZ-8UI.I).
FINDINGS: Non-cardiac: See separate report from [REDACTED]. No
significant findings on limited lung and soft tissue windows.

Calcium score: Isolated calcium in mid RCA

Coronary Arteries: Right dominant with no anomalies

LM: Normal

LAD:  Normal

IM: Normal

D1: Normal

D2: Normal

Circumflex: Normal

OM1: Normal

OM2: Normal

RCA: 1-24% calcified stenosis in the mid RCA

PDA: Normal

PLA: Normal
IMPRESSION: 1.  Non obstructive CAD isolated to mid RCA see description above

2.  Normal aortic root 3.3 cm

3.  Calcium score 22 which is 85 th percentile for age and sex

4.  Lipomatous hypertrophy of the atrial septum

Heber Gustavo Zeus

*** End of Addendum ***
EXAM:
OVER-READ INTERPRETATION  CT CHEST

The following report is an over-read performed by radiologist Dr.
Petter Soo [REDACTED] on 01/08/2020. This
over-read does not include interpretation of cardiac or coronary
anatomy or pathology. The coronary calcium score/coronary CTA
interpretation by the cardiologist is attached.
FINDINGS: Aortic atherosclerosis. 8 x 5 mm pulmonary nodule in the subpleural
aspect of the right middle lobe (axial image 24 of series 12),
stable to slightly decreased in size compared to the prior study,
likely a subpleural lymph node. Within the visualized portions of
the thorax there are no suspicious appearing pulmonary nodules or
masses, there is no acute consolidative airspace disease, no pleural
effusions, no pneumothorax and no lymphadenopathy. Visualized
portions of the upper abdomen are unremarkable. There are no
aggressive appearing lytic or blastic lesions noted in the
visualized portions of the skeleton.
IMPRESSION: 1. Small pulmonary nodule in the subpleural aspect of the right
middle lobe, similar to prior studies dating back to 05/13/2019,
statistically likely a subpleural lymph node.
2.  Aortic Atherosclerosis (QAQVZ-8UI.I).

## 2022-03-14 ENCOUNTER — Encounter: Payer: Self-pay | Admitting: Pulmonary Disease

## 2022-03-15 ENCOUNTER — Encounter: Payer: Self-pay | Admitting: Pulmonary Disease

## 2022-03-15 ENCOUNTER — Telehealth: Payer: Self-pay | Admitting: Pulmonary Disease

## 2022-03-16 NOTE — Telephone Encounter (Signed)
Called and spoke with patient who states that she is traveling to [place] and wears oxygen with her CPAP and is not sure what to do about oxygen when she travels. Advised her to call [employer] and see what their options are or see if there is a local DME where she is going and see if they have something that she can rent while she is there. She expressed understanding. Advised her to call us back if there are any issues. Nothing further needed at this time.

## 2022-03-16 NOTE — Telephone Encounter (Signed)
Pt following up with her mychart message.

## 2022-03-19 NOTE — Telephone Encounter (Signed)
She is using CPAP 12 cm H2O with 2 liters oxygen at night.  She uses 3 liters during the day.  She gets supplies from [facility].    She needs to contact her airline to see if there are forms that need to be completed by [name] so that she can fly with supplemental oxygen.  There shouldn't be any issues with her bringing her CPAP machine on the plane when she travels.  She should bring this as a carry-on and not put it in her checked baggage to reduce risk the machine might get damaged.  Okay to send order to [employer] to see if they can provide rental oxygen equipment for her to travel with.

## 2022-03-21 ENCOUNTER — Encounter: Payer: Self-pay | Admitting: Acute Care

## 2022-03-21 ENCOUNTER — Ambulatory Visit (INDEPENDENT_AMBULATORY_CARE_PROVIDER_SITE_OTHER): Payer: 59 | Admitting: Acute Care

## 2022-03-21 ENCOUNTER — Ambulatory Visit
Admission: RE | Admit: 2022-03-21 | Discharge: 2022-03-21 | Disposition: A | Discharge status: Home / Self Care | Payer: Medicare Other | Source: Ambulatory Visit | Attending: Acute Care | Admitting: Acute Care

## 2022-03-21 DIAGNOSIS — Z122 Encounter for screening for malignant neoplasm of respiratory organs: Secondary | ICD-10-CM

## 2022-03-21 DIAGNOSIS — F17210 Nicotine dependence, cigarettes, uncomplicated: Secondary | ICD-10-CM

## 2022-03-21 DIAGNOSIS — Z87891 Personal history of nicotine dependence: Secondary | ICD-10-CM

## 2022-03-21 NOTE — Patient Instructions (Signed)
Thank you for participating in the [place] [facility] Cancer Screening Program. It was our pleasure to meet you today. We will call you with the results of your scan within the next few days. Your scan will be assigned a Lung RADS category score by the physicians reading the scans.  This Lung RADS score determines follow up scanning.  See below for description of categories, and follow up screening recommendations. We will be in touch to schedule your follow up screening annually or based on recommendations of our providers. We will fax a copy of your scan results to your Primary Care Physician, or the physician who referred you to the program, to ensure they have the results. Please call the office if you have any questions or concerns regarding your scanning experience or results.  Our office number is [phone]. Please speak with Denise Phelps, RN. , or  Denise Buckner RN, They are  our Lung Cancer Screening RN.'s If They are unavailable when you call, Please leave a message on the voice mail. We will return your call at our earliest convenience.This voice mail is monitored several times a day.  Remember, if your scan is normal, we will scan you annually as long as you continue to meet the criteria for the program. (Age 55-77, Current smoker or smoker who has quit within the last 15 years). If you are a smoker, remember, quitting is the single most powerful action that you can take to decrease your risk of lung cancer and other pulmonary, breathing related problems. We know quitting is hard, and we are here to help.  Please let us know if there is anything we can do to help you meet your goal of quitting. If you are a former smoker, congratulations. We are proud of you! Remain smoke free! Remember you can refer friends or family members through the number above.  We will screen them to make sure they meet criteria for the program. Thank you for helping us take better care of you by  participating in Lung Screening.  You can receive free nicotine replacement therapy ( patches, gum or mints) by calling 1-800-QUIT NOW. Please call so we can get you on the path to becoming  a non-smoker. I know it is hard, but you can do this!  Lung RADS Categories:  Lung RADS 1: no nodules or definitely non-concerning nodules.  Recommendation is for a repeat annual scan in 12 months.  Lung RADS 2:  nodules that are non-concerning in appearance and behavior with a very low likelihood of becoming an active cancer. Recommendation is for a repeat annual scan in 12 months.  Lung RADS 3: nodules that are probably non-concerning , includes nodules with a low likelihood of becoming an active cancer.  Recommendation is for a 6-month repeat screening scan. Often noted after an upper respiratory illness. We will be in touch to make sure you have no questions, and to schedule your 6-month scan.  Lung RADS 4 A: nodules with concerning findings, recommendation is most often for a follow up scan in 3 months or additional testing based on our provider's assessment of the scan. We will be in touch to make sure you have no questions and to schedule the recommended 3 month follow up scan.  Lung RADS 4 B:  indicates findings that are concerning. We will be in touch with you to schedule additional diagnostic testing based on our provider's  assessment of the scan.  Other options for assistance in smoking cessation (   As covered by your insurance benefits)  Hypnosis for smoking cessation  [employer] [phone]  Acupuncture for smoking cessation  [facility] [phone]   

## 2022-03-21 NOTE — Progress Notes (Signed)
Virtual Visit via Telephone Note  I connected with Jillian [name] on 08/29/21 at  2:00 PM EST by telephone and verified that I am speaking with the correct person using two identifiers.  Location: Patient: Home Provider: Working from home   I discussed the limitations, risks, security and privacy concerns of performing an evaluation and management service by telephone and the availability of in person appointments. I also discussed with the patient that there may be a patient responsible charge related to this service. The patient expressed understanding and agreed to proceed.  Shared Decision Making Visit Lung Cancer Screening Program [phone])   Eligibility: Age 56 y.o. Pack Years Smoking History Calculation 62 (# packs/per year x # years smoked) Recent History of coughing up blood  no Unexplained weight loss? no ( >Than 15 pounds within the last 6 months ) Prior History Lung / other cancer no (Diagnosis within the last 5 years already requiring surveillance chest CT Scans). Smoking Status Current Smoker Former Smokers: Years since quit: NA  Quit Date: NA  Visit Components: Discussion included one or more decision making aids. yes Discussion included risk/benefits of screening. yes Discussion included potential follow up diagnostic testing for abnormal scans. yes Discussion included meaning and risk of over diagnosis. yes Discussion included meaning and risk of False Positives. yes Discussion included meaning of total radiation exposure. yes  Counseling Included: Importance of adherence to annual lung cancer LDCT screening. yes Impact of comorbidities on ability to participate in the program. yes Ability and willingness to under diagnostic treatment. yes  Smoking Cessation Counseling: Current Smokers:  Discussed importance of smoking cessation. yes Information about tobacco cessation classes and interventions provided to patient. yes Patient provided with "ticket" for LDCT  Scan. yes Symptomatic Patient. yes  Counseling(Intermediate counseling: > three minutes) 99406 Diagnosis Code: Tobacco Use Z72.0 Asymptomatic Patient no  Counseling NA Former Smokers:  Discussed the importance of maintaining cigarette abstinence. yes Diagnosis Code: Personal History of Nicotine Dependence. I43.329 Information about tobacco cessation classes and interventions provided to patient. Yes Patient provided with "ticket" for LDCT Scan. yes Written Order for Lung Cancer Screening with LDCT placed in Epic. Yes (CT Chest Lung Cancer Screening Low Dose W/O CM) [id number] Z12.2-Screening of respiratory organs Z87.891-Personal history of nicotine dependence   I spent 25 minutes of face to face time with her discussing the risks and benefits of lung cancer screening. We viewed a power point together that explained in detail the above noted topics. We took the time to pause the power point at intervals to allow for questions to be asked and answered to ensure understanding. We discussed that she had taken the single most powerful action possible to decrease her risk of developing lung cancer when she quit smoking. I counseled her to remain smoke free, and to contact me if she ever had the desire to smoke again so that I can provide resources and tools to help support the effort to remain smoke free. We discussed the time and location of the scan, and that either  Doroteo Glassman RN or I will call with the results within  24-48 hours of receiving them. She has my card and contact information in the event she needs to speak with me, in addition to a copy of the power point we reviewed as a resource. She verbalized understanding of all of the above and had no further questions upon leaving the office.     I explained to the patient that there has been a  high incidence of coronary artery disease noted on these exams. I explained that this is a non-gated exam therefore degree or severity cannot be  determined. This patient is on statin therapy. I have asked the patient to follow-up with their PCP regarding any incidental finding of coronary artery disease and management with diet or medication as they feel is clinically indicated. The patient verbalized understanding of the abov e and had no further questions.   I spent 3 minutes counseling on smoking cessation and the health risks of continued tobacco abuse    Maylynn Orzechowski D. Kenton Kingfisher, NP-C [facility] Personal contact information can be found on Amion  03/21/2022, 8:42 AM

## 2022-03-23 ENCOUNTER — Other Ambulatory Visit: Payer: Self-pay | Admitting: Acute Care

## 2022-03-23 DIAGNOSIS — Z122 Encounter for screening for malignant neoplasm of respiratory organs: Secondary | ICD-10-CM

## 2022-03-23 DIAGNOSIS — F17210 Nicotine dependence, cigarettes, uncomplicated: Secondary | ICD-10-CM

## 2022-03-23 DIAGNOSIS — Z87891 Personal history of nicotine dependence: Secondary | ICD-10-CM

## 2022-04-04 ENCOUNTER — Ambulatory Visit: Payer: 59 | Admitting: Pulmonary Disease

## 2022-04-30 ENCOUNTER — Ambulatory Visit: Payer: 59 | Admitting: Pulmonary Disease

## 2022-05-07 ENCOUNTER — Encounter: Payer: Self-pay | Admitting: Pulmonary Disease

## 2022-05-08 ENCOUNTER — Ambulatory Visit: Payer: 59 | Admitting: Pulmonary Disease

## 2022-05-08 MED ORDER — AZITHROMYCIN 250 MG PO TABS: ORAL_TABLET | ORAL | 0 refills | Status: AC

## 2022-05-24 ENCOUNTER — Ambulatory Visit: Payer: 59 | Admitting: Pulmonary Disease

## 2022-06-03 ENCOUNTER — Other Ambulatory Visit: Payer: Self-pay | Admitting: Pulmonary Disease

## 2022-06-08 ENCOUNTER — Telehealth: Payer: Self-pay | Admitting: *Deleted

## 2022-06-08 ENCOUNTER — Encounter: Payer: Self-pay | Admitting: Pulmonary Disease

## 2022-06-08 NOTE — Telephone Encounter (Signed)
Mychart message: I hope its not to late to ask for help. My chest is tight and have no energy.  I did covid test and it was negative. This has been going on since Monday. Been taking micinex and not helping. Can Dr  Erin Fulling please call me in a Zpak with prednisone taper. Please don't let me go into the weekend like this. Thank you  ATC x1.  No answer.  LVM to return call.

## 2022-06-08 NOTE — Telephone Encounter (Signed)
Called and spoke with patient.  Advised patient to go to Urgent Care or ED  to be seen and evaluated since it was after 5:30pm.  Patient stated understanding.  Nothing further at this time.

## 2022-06-21 ENCOUNTER — Ambulatory Visit (INDEPENDENT_AMBULATORY_CARE_PROVIDER_SITE_OTHER): Payer: Medicare Other | Admitting: Pulmonary Disease

## 2022-06-21 ENCOUNTER — Encounter: Payer: Self-pay | Admitting: Pulmonary Disease

## 2022-06-21 ENCOUNTER — Telehealth: Payer: Self-pay

## 2022-06-21 VITALS — BP 132/78 | HR 72 | Temp 97.7°F | Ht 62.0 in | Wt 173.8 lb

## 2022-06-21 DIAGNOSIS — R918 Other nonspecific abnormal finding of lung field: Secondary | ICD-10-CM

## 2022-06-21 DIAGNOSIS — J9611 Chronic respiratory failure with hypoxia: Secondary | ICD-10-CM | POA: Diagnosis not present

## 2022-06-21 DIAGNOSIS — G4733 Obstructive sleep apnea (adult) (pediatric): Secondary | ICD-10-CM | POA: Diagnosis not present

## 2022-06-21 DIAGNOSIS — J449 Chronic obstructive pulmonary disease, unspecified: Secondary | ICD-10-CM

## 2022-06-21 DIAGNOSIS — Z720 Tobacco use: Secondary | ICD-10-CM | POA: Diagnosis not present

## 2022-06-21 NOTE — Progress Notes (Signed)
[facility]  Chief Complaint  Patient presents with   Follow-up    Had to give up O2 at night time because she couldnt afford it.  CPAP dl ending in May but pt says she has used it since then     Constitutional:  BP 132/78 (BP Location: Right Arm, Patient Position: Sitting)   Pulse 72   Temp 97.7 F (36.5 C) (Temporal)   Ht '5\' 2"'$  (1.575 m)   Wt 173 lb 12.8 oz (78.8 kg)   SpO2 97% Comment: ra  BMI 31.79 kg/m   Past Medical History:  Anxiety, OA, Asthma, Depression, HTN, Migraine HA  Past Surgical History:  She  has a past surgical history that includes Appendectomy; Laparoscopic cholecystectomy; Partial hysterectomy; Bladder surgery; ileum resection (1610); and Colonoscopy (06/2018).  Brief Summary:  Jillian [name] is a 56 y.o. female smoker with obstructive sleep apnea and COPD with chronic hypoxic respiratory failure.      Subjective:   She had low dose CT chest in June.  Stable emphysema and nodules.  She was able to stop smoking for 3 weeks when she went to [place].  Unfortunately, when she returned to [place] she started smoking again.  She lives her her son and daughter-in-law and they both smoke.  She is smoking about 1.5 ppd now.  She has cough with clear sputum.  Gets chest tightness intermittently.  Using CPAP nightly. Has full face mask.  Pressure is okay.  She is getting mouth dryness.  She wasn't able to afford monthly payments for home oxygen set up, and had to turn her oxygen equipment back into [employer].  She is still in the process of paying off her bills for this.  She lives in [place].  It is becoming more difficult for her to travel to [facility] for medical care.   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 ONO with CPAP 11/23/21 >> test time 8 hrs 1 min.  Baseline SpO2 89%, SpO2 low 79%.  Spent 1 hr 43 min with an SpO2 < 88%. CPAP titration 01/18/22 >> CPAP 12 with 2 liters O2 >> AHI 0 CPAP 02/06/22 to 03/07/22 >> used on 30 of 30 nights with average 8 hrs 2 min.  Average AHI 0.6 with CPAP 12 cm H2O  Cardiac Tests:  Echo 12/31/19 >> EF greater than 75%  Social History:  She  reports that she has been smoking cigarettes. She has a 64.50 pack-year smoking history. She has never used smokeless tobacco. She reports current alcohol use. She reports that she does not use  drugs.  Family History:  Her family history includes COPD in her mother; Diabetes in her mother and sister; Heart attack in her father.     Assessment/Plan:   Obstructive sleep apnea. - she is compliant with therapy and reports benefit from CPAP use - she uses [employer] for her DME - current CPAP ordered April 2022 - she is having trouble with mouth dryness - will change to CPAP 10 cm H2O - she can try adjusting her CPAP humidifier setting - she can try biotene mouth rinse - if dryness persists, then she might need to have her CPAP mask refit  Chronic respiratory failure with hypoxemia. - in setting of COPD and sleep apnea - she had O2 set up through [employer], but had to return her equipment because she couldn't afford the monthly payments -  she will let us know when she is ready to have assessment for oxygen therapy again  COPD with chronic bronchitis and emphysema. - continue spiriva respimat, advair 500 - prn albuterol - she has a nebulizer machine  Lung nodules. - follow up low dose CT chest in June 2024  Tobacco abuse. - reviewed options to assist with smoking cessation and explained how she would likely be able to afford her medical therapies more easily if she stopped smoking and saved money this way - she will try electronic cigarettes  Time Spent Involved in Patient Care on Day of Examination:  38 minutes  Follow up:   Patient Instructions  Will have [employer] change your CPAP to 10 cm water pressure.  Try using biotene mouth rinse to help with mouth dryness.  You can also try adjusting the humidifier setting on your CPAP machine.  Follow up in 6 months with Dr. Halford Chessman in [place] office.  Medication List:   Allergies as of 06/21/2022       Reactions   Doxycycline Photosensitivity   Broke out with blisters after being in the sun   Banana Hives        Medication List        Accurate as of June 21, 2022  8:53 AM. If you have any questions, ask your nurse or doctor.          STOP taking these medications    fluconazole 150 MG tablet Commonly known as: DIFLUCAN Stopped by: Chesley Mires, MD       TAKE these medications    albuterol 108 (90 Base) MCG/ACT inhaler Commonly known as: VENTOLIN HFA TAKE 2 PUFFS BY MOUTH EVERY 6 HOURS AS NEEDED FOR WHEEZE OR SHORTNESS OF BREATH   ALKA-SELTZER HEARTBURN PO Take 1 tablet by mouth as needed (heartburn).   buPROPion 150 MG 12 hr tablet Commonly known as: WELLBUTRIN SR TAKE 1 TABLET (150 MG TOTAL) BY MOUTH 2 (TWO) TIMES DAILY. TAKE 1 TAB ONCE DAILY FOR 7 DAYS AND THEN INCREASE TO 2 TABLETS BID.   cyanocobalamin 1000 MCG tablet Commonly known as: VITAMIN B12 Take 1,000 mcg by mouth daily.   DULoxetine 60 MG capsule Commonly known as:  CYMBALTA Take 60 mg by mouth 2 (two) times daily.   fluticasone-salmeterol 500-50 MCG/ACT Aepb Commonly known as: Advair Diskus Inhale 1 puff into the lungs in the morning and at bedtime.   gabapentin 300 MG capsule Commonly known as: NEURONTIN Take 300 mg by mouth 3 (three) times daily as needed.   GOODY HEADACHE PO Take 1 tablet by mouth as needed.   ipratropium-albuterol 0.5-2.5 (3) MG/3ML Soln Commonly known as: DUONEB TAKE 3  MLS BY NEBULIZATION EVERY 4 (FOUR) HOURS AS NEEDED.   loratadine 10 MG tablet Commonly known as: CLARITIN Take 10 mg by mouth daily.   Magnesium 200 MG Tabs Take 1 tablet by mouth daily.   metFORMIN 500 MG tablet Commonly known as: GLUCOPHAGE Take by mouth 2 (two) times daily with a meal.   mometasone 50 MCG/ACT nasal spray Commonly known as: NASONEX Place 2 sprays into the nose daily.   pantoprazole 40 MG tablet Commonly known as: PROTONIX Take 1 tablet by mouth 2 (two) times daily before a meal.   rosuvastatin 20 MG tablet Commonly known as: CRESTOR Take 20 mg by mouth daily.   Spiriva Respimat 2.5 MCG/ACT Aers Generic drug: Tiotropium Bromide Monohydrate INHALE 2 PUFFS BY MOUTH INTO THE LUNGS DAILY   valsartan-hydrochlorothiazide 160-12.5 MG tablet Commonly known as: DIOVAN-HCT Take 1 tablet by mouth daily.   Vitamin D (Ergocalciferol) 1.25 MG (50000 UNIT) Caps capsule Commonly known as: DRISDOL Take 1 capsule by mouth once a week. tuesdays        Signature:  Chesley Mires, MD [place] Pager - [phone] 06/21/2022, 8:53 AM

## 2022-06-21 NOTE — Telephone Encounter (Signed)
Patient was seen in [facility] office this morning for Osa and would like to switch to Dr. Halford Chessman in [place] for OSA and COPd. Dr. Erin Fulling please advise are you ok with this?

## 2022-06-21 NOTE — Telephone Encounter (Signed)
Ok with me 

## 2022-06-21 NOTE — Patient Instructions (Signed)
Will have [employer] change your CPAP to 10 cm water pressure.  Try using biotene mouth rinse to help with mouth dryness.  You can also try adjusting the humidifier setting on your CPAP machine.  Follow up in 6 months with Dr. Halford Chessman in [place] office.

## 2022-06-22 NOTE — Telephone Encounter (Signed)
Hey can you look at Dr Collie Siad schedule for out there and see when he has any opening for this patient follow ups. Dr Erin Fulling states its fine for her to resume care with Dr Halford Chessman.  Thank you

## 2022-07-04 ENCOUNTER — Encounter: Payer: Self-pay | Admitting: Pulmonary Disease

## 2022-07-04 MED ORDER — PREDNISONE 10 MG PO TABS: ORAL_TABLET | ORAL | 0 refills | Status: DC

## 2022-07-04 MED ORDER — AMOXICILLIN-POT CLAVULANATE 875-125 MG PO TABS: 1.0000 | ORAL_TABLET | Freq: Two times a day (BID) | ORAL | 0 refills | Status: DC

## 2022-07-04 NOTE — Telephone Encounter (Signed)
Primary Pulmonologist: Dr. Halford Chessman  Last office visit and with whom: 06/21/22 Dr. Halford Chessman  What do we see them for (pulmonary problems): OSA/ COPD  Last OV assessment/plan: see below   Was appointment offered to patient (explain)?  No availability in [facility] office    Reason for call: "Hi! I saw Dr. Halford Chessman couple wks ago. Im no better, actually worse. My head is stopped up, coughing  up greenish/brown stuff. Chest is very tight. Been getting worse since the Sept 13. Using mucinex but doing nothing. Can Dr. Halford Chessman please call me in a strong antibiotic to help with this? Thank you."  No fevers  Dr. Halford Chessman is out of office so routing to APP of day. Please advise. Thank you!   Allergies  Allergen Reactions   Doxycycline Photosensitivity    Broke out with blisters after being in the sun   Banana Hives    Immunization History  Administered Date(s) Administered   Influenza Inj Mdck Quad Pf 07/10/2019   Influenza Split 08/22/2018   Influenza,inj,Quad PF,6+ Mos 07/17/2017   Moderna Sars-Covid-2 Vaccination 01/06/2020, 02/01/2020, 10/03/2020   Pneumococcal Conjugate-13 12/23/2019   Pneumococcal Polysaccharide-23 10/30/2017     Assessment/Plan:    Obstructive sleep apnea. - she is compliant with therapy and reports benefit from CPAP use - she uses [employer] for her DME - current CPAP ordered April 2022 - she is having trouble with mouth dryness - will change to CPAP 10 cm H2O - she can try adjusting her CPAP humidifier setting - she can try biotene mouth rinse - if dryness persists, then she might need to have her CPAP mask refit   Chronic respiratory failure with hypoxemia. - in setting of COPD and sleep apnea - she had O2 set up through [employer], but had to return her equipment because she couldn't afford the monthly payments - she will let us know when she is ready to have assessment for oxygen therapy again   COPD with chronic bronchitis and emphysema. - continue spiriva respimat, advair 500 - prn  albuterol - she has a nebulizer machine   Lung nodules. - follow up low dose CT chest in June 2024   Tobacco abuse. - reviewed options to assist with smoking cessation and explained how she would likely be able to afford her medical therapies more easily if she stopped smoking and saved money this way - she will try electronic cigarettes

## 2022-07-04 NOTE — Telephone Encounter (Signed)
I will send in abx and pred taper fof COPD flare. Continue prescribed inhalers and Mucinex 1 to 2 tablets twice daily.  Use albuterol rescue inhaler 2 puffs every 4-6 hours as needed for breakthrough shortness of breath and wheezing.

## 2022-07-06 ENCOUNTER — Encounter: Payer: Self-pay | Admitting: Pulmonary Disease

## 2022-07-06 ENCOUNTER — Telehealth: Payer: Self-pay | Admitting: Pulmonary Disease

## 2022-07-06 MED ORDER — IPRATROPIUM-ALBUTEROL 0.5-2.5 (3) MG/3ML IN SOLN: 3.0000 mL | RESPIRATORY_TRACT | 3 refills | Status: DC | PRN

## 2022-07-06 NOTE — Telephone Encounter (Signed)
Called and spoke with pt letting her know not to use the expired duoneb sol and she verbalized understanding. Nothing further needed.

## 2022-07-08 ENCOUNTER — Encounter: Payer: Self-pay | Admitting: Pulmonary Disease

## 2022-07-09 NOTE — Telephone Encounter (Signed)
Patients messages addressed in next note mychart

## 2022-07-09 NOTE — Telephone Encounter (Signed)
Patient sent mychart message asking for a stronger antibiotic than amoxicillin. She states it doesn't seem to be working and has been coughing so much that her ribs are hurting.Patient states this has been going on since Sept. 13th.   Patient also went ER over the weekend and was given another rx of prednisone taper (60 mg X2 days, 40 mg for 2 days, and 20 mg for 2 days). Patient is currently has 3 days left on prev. Prednisone prescription given to her by Dr. Halford Chessman and wants to know if she should finish original rx first and then take new rx right after.

## 2022-07-09 NOTE — Telephone Encounter (Signed)
She should take the new prescription for prednisone from the ER.

## 2022-07-10 ENCOUNTER — Encounter: Payer: Self-pay | Admitting: Pulmonary Disease

## 2022-07-10 NOTE — Telephone Encounter (Signed)
"  Hi. I've had 3 breathing treatments since 6:30 this morning. Very little change. Do I continue doing the treatments or am I doing to much."  Please advise. Patient is referring to prev. Mychart message

## 2022-07-10 NOTE — Telephone Encounter (Signed)
She should continue advair bid and spiriva daily.  She can her nebulizer twice per day until she finishes prednisone.  Then she can use her nebulizer as needed.

## 2022-07-11 ENCOUNTER — Encounter: Payer: Self-pay | Admitting: Pulmonary Disease

## 2022-07-11 MED ORDER — AMOXICILLIN-POT CLAVULANATE 875-125 MG PO TABS: 1.0000 | ORAL_TABLET | Freq: Two times a day (BID) | ORAL | 0 refills | Status: DC

## 2022-07-11 MED ORDER — AMOXICILLIN-POT CLAVULANATE 875-125 MG PO TABS: 1.0000 | ORAL_TABLET | Freq: Two times a day (BID) | ORAL | 0 refills | Status: AC

## 2022-07-11 NOTE — Telephone Encounter (Signed)
Please send script for augmentin 875 bid for 5 days.

## 2022-07-11 NOTE — Addendum Note (Signed)
Addended by: Fritzi Mandes D on: 07/11/2022 03:05 PM   Modules accepted: Orders

## 2022-07-11 NOTE — Addendum Note (Signed)
Addended by: Fritzi Mandes D on: 07/11/2022 01:38 PM   Modules accepted: Orders

## 2022-07-11 NOTE — Telephone Encounter (Signed)
"  Sorry its me again. Ok, my sinus are really draining, pounding headache. Im coughing up alot of flem to the point of throwing up. This is my last day of Amoxicillan and I don't think my head is cleared up good yet. Please ask Dr. Halford Chessman if I can get another round to really clear this mess up. Im really sorry, but I'm so miserable and  need so relief.  Thank you "   Dr. Halford Chessman please advise

## 2022-07-13 ENCOUNTER — Encounter: Payer: Self-pay | Admitting: Pulmonary Disease

## 2022-07-13 NOTE — Telephone Encounter (Signed)
Jillian [name]  P [facility] (supporting Chesley Mires, MD) Just now (12:00 PM)    I noticed a pinkish red substance when I blew my nose this morning. Well it caused me to cough up flem also. So it ended up combined, but I think it came from my sinuses.

## 2022-07-16 NOTE — Telephone Encounter (Signed)
She is having too many issues over the past couple of weeks.  Please have her come to the office for an appointment.

## 2022-07-17 ENCOUNTER — Ambulatory Visit (INDEPENDENT_AMBULATORY_CARE_PROVIDER_SITE_OTHER): Payer: Medicare Other | Admitting: Pulmonary Disease

## 2022-07-17 ENCOUNTER — Encounter: Payer: Self-pay | Admitting: Pulmonary Disease

## 2022-07-17 VITALS — BP 118/62 | HR 98 | Temp 98.4°F | Ht 62.0 in | Wt 169.6 lb

## 2022-07-17 DIAGNOSIS — J449 Chronic obstructive pulmonary disease, unspecified: Secondary | ICD-10-CM | POA: Diagnosis not present

## 2022-07-17 DIAGNOSIS — J9611 Chronic respiratory failure with hypoxia: Secondary | ICD-10-CM | POA: Diagnosis not present

## 2022-07-17 DIAGNOSIS — G4733 Obstructive sleep apnea (adult) (pediatric): Secondary | ICD-10-CM

## 2022-07-17 DIAGNOSIS — R918 Other nonspecific abnormal finding of lung field: Secondary | ICD-10-CM | POA: Diagnosis not present

## 2022-07-17 DIAGNOSIS — J441 Chronic obstructive pulmonary disease with (acute) exacerbation: Secondary | ICD-10-CM

## 2022-07-17 DIAGNOSIS — Z720 Tobacco use: Secondary | ICD-10-CM

## 2022-07-17 DIAGNOSIS — J0140 Acute pansinusitis, unspecified: Secondary | ICD-10-CM

## 2022-07-17 MED ORDER — TRELEGY ELLIPTA 100-62.5-25 MCG/ACT IN AEPB: 1.0000 | INHALATION_SPRAY | Freq: Every day | RESPIRATORY_TRACT | 5 refills | Status: DC

## 2022-07-17 MED ORDER — NYSTATIN 100000 UNIT/ML MT SUSP: 500000.0000 [IU] | Freq: Four times a day (QID) | OROMUCOSAL | 0 refills | Status: DC

## 2022-07-17 MED ORDER — PREDNISONE 10 MG PO TABS: ORAL_TABLET | ORAL | 0 refills | Status: AC

## 2022-07-17 MED ORDER — ALBUTEROL SULFATE (2.5 MG/3ML) 0.083% IN NEBU: 2.5000 mg | INHALATION_SOLUTION | Freq: Four times a day (QID) | RESPIRATORY_TRACT | 5 refills | Status: DC | PRN

## 2022-07-17 MED ORDER — AMOXICILLIN-POT CLAVULANATE 875-125 MG PO TABS: 1.0000 | ORAL_TABLET | Freq: Two times a day (BID) | ORAL | 0 refills | Status: DC

## 2022-07-17 NOTE — Progress Notes (Signed)
[name], Critical Care, and Sleep Medicine  Chief Complaint  Patient presents with   Follow-up    Acute visit sinus drainage, prod. Cough with yellow sputum, head feels "heavy" Thrush, chest tightness     Constitutional:  BP 118/62 (BP Location: Right Arm, Patient Position: Sitting)   Pulse 98   Temp 98.4 F (36.9 C) (Temporal)   Ht '5\' 2"'$  (1.575 m)   Wt 169 lb 9.6 oz (76.9 kg)   SpO2 96% Comment: ra  BMI 31.02 kg/m   Past Medical History:  Anxiety, OA, Asthma, Depression, HTN, Migraine HA  Past Surgical History:  She  has a past surgical history that includes Appendectomy; Laparoscopic cholecystectomy; Partial hysterectomy; Bladder surgery; ileum resection (3220); and Colonoscopy (06/2018).  Brief Summary:  Jillian Jillian [name] is a 56 y.o. female smoker with obstructive sleep apnea and COPD with chronic hypoxic respiratory failure.      Subjective:   Antibiotics and prednisone started on 07/04/22.  She went to ER on 07/08/22.  Chest xray from 07/08/22 showed trace Rt pleural effusion with atelectasis.  She is still having sinus congestion and pressure.  Feels tight in her chest.  Coughing up brown phlegm.  Not sure if she had fever.  Had bloody nose few days ago, but not since.  Throat feels raw.  Physical Exam:   Appearance - well kempt   ENMT - no sinus tenderness, no oral exudate, no LAN, Mallampati 3 airway, no stridor, clear nasal drainage, mild tenderness of frontal and maxillary sinuses; white exudate on hard palate, posterior pharynx and uvula  Respiratory - faint bilateral wheezing  CV - s1s2 regular rate and rhythm, no murmurs  Ext - no clubbing, no edema  Skin - no rashes  Psych - normal mood and affect    Jillian [name] testing:  A1AT 12/09/19 >> 184, MM PFT 01/18/20 >> FEV1 1.43 (56%), FEV1% 70, TLC 4.34 (89%), DLCO 69%, +BD  Chest Imaging:  CT chest 09/20/20 >> 4 mm nodule RUL no change, 4 mm nodule LUL no change, 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 ONO with CPAP 11/23/21 >> test time 8 hrs 1 min.  Baseline SpO2 89%, SpO2 low 79%.  Spent 1 hr 43 min with an SpO2 < 88%. CPAP titration 01/18/22 >> CPAP 12 with 2 liters O2 >> AHI 0 CPAP 02/06/22 to 03/07/22 >> used on 30 of 30 nights with average 8 hrs 2 min.  Average AHI 0.6 with CPAP 12 cm H2O  Cardiac Tests:  Echo 12/31/19 >> EF greater than 75%  Social History:  She  reports that she has been smoking cigarettes. She has a 64.50 pack-year smoking history. She has never used smokeless tobacco. She reports current alcohol use. She reports that she does not use drugs.  Family History:  Her family history includes COPD in her mother; Diabetes in her mother and sister; Heart attack in her father.     Assessment/Plan:   Acute COPD exacerbation with acute sinusitis. - incomplete response to recent course of prednisone and amoxicillin - will give an additional 6 days of prednisone and change to augmentin for 7 days  COPD with chronic bronchitis and emphysema. - change from spiriva and advair to trelegy 100 one puff  daily - prn albuterol - advised she can use her nebulizer bid until she feels better, then prn  Obstructive sleep apnea. - she is compliant with therapy and reports benefit from CPAP use - she uses [facility] for her DME - current CPAP ordered April 2022 - continue CPAP 10 cm H2O  Chronic respiratory failure with hypoxemia. - in setting of COPD and sleep apnea - she had O2 set up through [facility], but had to return her equipment because she couldn't afford the monthly payments - she will let us know when she is ready to have assessment for oxygen therapy again  Lung nodules. - follow up low dose CT chest in June  2024  Tobacco abuse. - encouraged her to keep up with smoking cessation efforts  Thrush. - will change inhaler regimen - discussed the importance of oral hygiene after using ICS containing inhalers - nystatin 5 ml qid for 5 days; if not improved, then would need course of fluconazole  Time Spent Involved in Patient Care on Day of Examination:  46 minutes  Follow up:   Patient Instructions  Augmentin antibiotic twice per day for 7 days  Prednisone 10 mg pill >> 2 pills daily for 3 days, then 1 pill daily for 3 days  Trelegy one puff daily, and rinse your mouth after each use  Stop using advair and spiriva once you start using trelegy  Nystatin 5 ml swish and spit or swallow 4 times daily for 5 days  Follow up in 6 weeks  Medication List:   Allergies as of 07/17/2022       Reactions   Doxycycline Photosensitivity   Broke out with blisters after being in the sun   Banana Hives        Medication List        Accurate as of July 17, 2022 12:00 PM. If you have any questions, ask your nurse or doctor.          STOP taking these medications    fluticasone-salmeterol 500-50 MCG/ACT Aepb Commonly known as: Advair Diskus Stopped by: Chesley Mires, MD   ipratropium-albuterol 0.5-2.5 (3) MG/3ML Soln Commonly known as: DUONEB Stopped by: Chesley Mires, MD   Spiriva Respimat 2.5 MCG/ACT Aers Generic drug: Tiotropium Bromide Monohydrate Stopped by: Chesley Mires, MD       TAKE these medications    albuterol 108 (90 Base) MCG/ACT inhaler Commonly known as: VENTOLIN HFA TAKE 2 PUFFS BY MOUTH EVERY 6 HOURS AS NEEDED FOR WHEEZE OR SHORTNESS OF BREATH What changed: Another medication with the same name was added. Make sure you understand how and when to take each. Changed by: Chesley Mires, MD   albuterol (2.5 MG/3ML) 0.083% nebulizer solution Commonly known as: PROVENTIL Take 3 mLs (2.5 mg total) by nebulization every 6 (six) hours as needed for wheezing or shortness  of breath. What changed: You were already taking a medication with the same name, and this prescription was added. Make sure you understand how and when to take each. Changed by: Chesley Mires, MD   ALKA-SELTZER HEARTBURN PO Take 1 tablet by mouth as needed (heartburn).   amoxicillin-clavulanate 875-125 MG tablet Commonly known as: AUGMENTIN Take 1 tablet by mouth 2 (two) times daily.   buPROPion 150 MG 12 hr tablet Commonly known as: WELLBUTRIN SR TAKE 1 TABLET (150 MG TOTAL) BY MOUTH 2 (TWO) TIMES DAILY. TAKE 1 TAB ONCE DAILY FOR 7 DAYS AND THEN INCREASE TO 2 TABLETS BID.   cyanocobalamin 1000 MCG tablet  Commonly known as: VITAMIN B12 Take 1,000 mcg by mouth daily.   DULoxetine 60 MG capsule Commonly known as: CYMBALTA Take 60 mg by mouth 2 (two) times daily.   gabapentin 300 MG capsule Commonly known as: NEURONTIN Take 300 mg by mouth 3 (three) times daily as needed.   GOODY HEADACHE PO Take 1 tablet by mouth as needed.   loratadine 10 MG tablet Commonly known as: CLARITIN Take 10 mg by mouth daily.   Magnesium 200 MG Tabs Take 1 tablet by mouth daily.   metFORMIN 500 MG tablet Commonly known as: GLUCOPHAGE Take by mouth 2 (two) times daily with a meal.   mometasone 50 MCG/ACT nasal spray Commonly known as: NASONEX Place 2 sprays into the nose daily.   nystatin 100000 UNIT/ML suspension Commonly known as: MYCOSTATIN Take 5 mLs (500,000 Units total) by mouth 4 (four) times daily. Started by: Chesley Mires, MD   pantoprazole 40 MG tablet Commonly known as: PROTONIX Take 1 tablet by mouth 2 (two) times daily before a meal.   predniSONE 10 MG tablet Commonly known as: DELTASONE Take 2 tablets (20 mg total) by mouth daily with breakfast for 3 days, THEN 1 tablet (10 mg total) daily with breakfast for 3 days. Start taking on: July 17, 2022 What changed: See the new instructions. Changed by: Chesley Mires, MD   rosuvastatin 20 MG tablet Commonly known as:  CRESTOR Take 20 mg by mouth daily.   Trelegy Ellipta 100-62.5-25 MCG/ACT Aepb Generic drug: Fluticasone-Umeclidin-Vilant Inhale 1 puff into the lungs daily in the afternoon. Started by: Chesley Mires, MD   valsartan-hydrochlorothiazide 160-12.5 MG tablet Commonly known as: DIOVAN-HCT Take 1 tablet by mouth daily.   Vitamin D (Ergocalciferol) 1.25 MG (50000 UNIT) Caps capsule Commonly known as: DRISDOL Take 1 capsule by mouth once a week. tuesdays        Signature:  Chesley Mires, MD [place] Pager - [phone] 07/17/2022, 12:00 PM

## 2022-07-17 NOTE — Patient Instructions (Signed)
Augmentin antibiotic twice per day for 7 days  Prednisone 10 mg pill >> 2 pills daily for 3 days, then 1 pill daily for 3 days  Trelegy one puff daily, and rinse your mouth after each use  Stop using advair and spiriva once you start using trelegy  Nystatin 5 ml swish and spit or swallow 4 times daily for 5 days  Follow up in 6 weeks

## 2022-07-24 ENCOUNTER — Encounter: Payer: Self-pay | Admitting: Pulmonary Disease

## 2022-07-24 MED ORDER — FLUCONAZOLE 100 MG PO TABS: 100.0000 mg | ORAL_TABLET | Freq: Every day | ORAL | 0 refills | Status: AC

## 2022-07-24 NOTE — Telephone Encounter (Signed)
Please send script for diflucan 100 mg daily for 3 days.

## 2022-07-24 NOTE — Telephone Encounter (Signed)
Goodmorning! Feeling better but now I have a super bad female yeast infection.  Can Dr. Halford Chessman please call me in 2 of the 1pill yeast infection treatments only because I'm going to [place] and I want too make sure it goes away. Thank you  Dr. Halford Chessman please advise

## 2022-08-08 ENCOUNTER — Encounter: Payer: Self-pay | Admitting: Pulmonary Disease

## 2022-08-08 ENCOUNTER — Other Ambulatory Visit: Payer: Self-pay | Admitting: Pulmonary Disease

## 2022-08-08 ENCOUNTER — Telehealth: Payer: Self-pay | Admitting: Pulmonary Disease

## 2022-08-08 MED ORDER — AZITHROMYCIN 250 MG PO TABS: ORAL_TABLET | ORAL | 0 refills | Status: DC

## 2022-08-08 MED ORDER — PREDNISONE 10 MG PO TABS: ORAL_TABLET | ORAL | 0 refills | Status: AC

## 2022-08-08 NOTE — Telephone Encounter (Signed)
She wouldn't need both amoxicillin and Zpak.  Please send script for Zpak.  Please send script for prednisone 10 mg pill >> 3 pills daily for 2 days, 2 pills daily for 2 days, 1 pill daily for 2 days.  She would need an ROV if she is not improving by the end of this week.

## 2022-08-08 NOTE — Telephone Encounter (Signed)
Patient sent mychart message asking for prednisone, amoxicillin and a zpak to be sent to [facility] in [place] for congestion in her head. She states: "No fever, no covid cold sweats. Body aches all over."   Dr. Halford Chessman please advise.

## 2022-08-08 NOTE — Telephone Encounter (Signed)
LMOM for Pt to return call about Azithromycin and Z-Pak are the same medication.

## 2022-08-12 DIAGNOSIS — J431 Panlobular emphysema: Secondary | ICD-10-CM | POA: Insufficient documentation

## 2022-08-16 ENCOUNTER — Telehealth: Payer: Self-pay | Admitting: *Deleted

## 2022-08-16 NOTE — Telephone Encounter (Signed)
Patient called and would like some clarifications on medications. Pt States she is on albuterol, spreva and Advair  [facility] [place], [place]. Please call patient and advise [phone]

## 2022-08-16 NOTE — Telephone Encounter (Signed)
ATC x1.  LVM to return call. 

## 2022-08-20 ENCOUNTER — Encounter: Payer: Self-pay | Admitting: Pulmonary Disease

## 2022-08-20 NOTE — Telephone Encounter (Signed)
ATC, LVM to return call.

## 2022-08-30 ENCOUNTER — Encounter: Payer: Self-pay | Admitting: Pulmonary Disease

## 2022-08-30 ENCOUNTER — Ambulatory Visit (INDEPENDENT_AMBULATORY_CARE_PROVIDER_SITE_OTHER): Payer: Medicare Other | Admitting: Pulmonary Disease

## 2022-08-30 VITALS — BP 142/84 | HR 92 | Temp 98.8°F | Ht 62.0 in | Wt 176.4 lb

## 2022-08-30 DIAGNOSIS — J4489 Other specified chronic obstructive pulmonary disease: Secondary | ICD-10-CM

## 2022-08-30 DIAGNOSIS — J449 Chronic obstructive pulmonary disease, unspecified: Secondary | ICD-10-CM | POA: Diagnosis not present

## 2022-08-30 DIAGNOSIS — J439 Emphysema, unspecified: Secondary | ICD-10-CM | POA: Diagnosis not present

## 2022-08-30 DIAGNOSIS — G4733 Obstructive sleep apnea (adult) (pediatric): Secondary | ICD-10-CM

## 2022-08-30 MED ORDER — TRELEGY ELLIPTA 100-62.5-25 MCG/ACT IN AEPB: 1.0000 | INHALATION_SPRAY | Freq: Every day | RESPIRATORY_TRACT | 3 refills | Status: DC

## 2022-08-30 NOTE — Patient Instructions (Signed)
Trelegy one puff daily, and rinse your mouth after each use  Stop spiriva  Follow up in 4 months

## 2022-08-30 NOTE — Progress Notes (Signed)
[facility], [facility] Sleep [facility]  Chief Complaint  Patient presents with   Follow-up    Still using CPAP (DL in resmed ends 02/2022)  Has had issues with leg swelling, SOB, eye swelling and a recent hospital stay     Constitutional:  BP (!) 142/84   Pulse 92   Temp 98.8 F (37.1 C) (Temporal)   Ht '5\' 2"'$  (1.575 m)   Wt 176 lb 6.4 oz (80 kg)   SpO2 98% Comment: ra  BMI 32.26 kg/m   Past Medical History:  Anxiety, OA, Asthma, Depression, HTN, Migraine HA  Past Surgical History:  She  has a past surgical history that includes Appendectomy; Laparoscopic cholecystectomy; Partial hysterectomy; Bladder surgery; ileum resection (8416); and Colonoscopy (06/2018).  Brief Summary:  Jillian [name] is a 56 y.o. female smoker with obstructive sleep apnea and COPD with chronic hypoxic respiratory failure.      Subjective:   She was in hospital with hypercalcemia.  She was having upset stomach and taking calcium tablets.  She never got trelegy.  She has been using spiriva.  Doesn't feel like it helps enough.    Still smoking cigarettes.  No issue with CPAP.  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 ONO with CPAP 11/23/21 >> test time 8 hrs 1 min.  Baseline SpO2 89%, SpO2 low 79%.  Spent 1 hr 43 min with an SpO2 < 88%. CPAP titration 01/18/22 >> CPAP 12 with 2 liters O2 >> AHI 0 CPAP 02/06/22 to 03/07/22 >> used on 30 of 30 nights with average 8 hrs 2 min.  Average AHI 0.6 with CPAP 12 cm H2O  Cardiac Tests:  Echo 08/14/22 >> EF 65 to 70%, grade 1 DD  Social History:  She  reports that she has been smoking cigarettes. She has a 64.50 pack-year smoking history. She has never used smokeless tobacco. She reports current alcohol use. She reports that she does not use drugs.  Family History:  Her family history includes COPD in her mother; Diabetes in her mother and sister; Heart attack in her father.     Assessment/Plan:   COPD with chronic bronchitis and emphysema. - will change to trelegy 100 one puff daily; samples provided - prn albuterol - she has a nebulizer  Obstructive sleep apnea. - she is compliant with therapy and reports benefit from CPAP use - she uses [employer] for her DME - current CPAP ordered April 2022 - continue CPAP 10 cm H2O  Chronic respiratory failure with hypoxemia. - in setting of COPD and sleep  apnea - she had O2 set up through [facility], but had to return her equipment because she couldn't afford the monthly payments - she will let us know when she is ready to have assessment for oxygen therapy again  Lung nodules. - follow up low dose CT chest in June 2024  Tobacco abuse. - encouraged her to keep up with smoking cessation efforts   Time Spent Involved in Patient Care on Day of Examination:  35 minutes  Follow up:   Patient Instructions  Trelegy one puff daily, and rinse your mouth after each use  Stop spiriva  Follow up in 4 months  Medication List:   Allergies as of 08/30/2022       Reactions   Doxycycline Photosensitivity   Broke out with blisters after being in the sun   Banana  Hives        Medication List        Accurate as of August 30, 2022  9:26 AM. If you have any questions, ask your nurse or doctor.          STOP taking these medications    amoxicillin-clavulanate 875-125 MG tablet Commonly known as: AUGMENTIN Stopped by: Chesley Mires, MD   gabapentin 300 MG capsule Commonly known as: NEURONTIN Stopped by: Chesley Mires, MD   loratadine 10 MG tablet Commonly known as: CLARITIN Stopped by: Chesley Mires, MD   Magnesium 200 MG Tabs Stopped by: Chesley Mires, MD   nystatin 100000 UNIT/ML suspension Commonly known as: MYCOSTATIN Stopped by: Chesley Mires, MD       TAKE these medications    albuterol 108 (90 Base) MCG/ACT inhaler Commonly known as: VENTOLIN HFA TAKE 2 PUFFS BY MOUTH EVERY 6 HOURS AS NEEDED FOR WHEEZE OR SHORTNESS OF BREATH   albuterol (2.5 MG/3ML) 0.083% nebulizer solution Commonly known as: PROVENTIL Take 3 mLs (2.5 mg total) by nebulization every 6 (six) hours as needed for wheezing or shortness of breath.   ALKA-SELTZER HEARTBURN PO Take 1 tablet by mouth as needed (heartburn).   buPROPion 150 MG 12 hr tablet Commonly known as: WELLBUTRIN SR TAKE 1 TABLET (150 MG TOTAL) BY MOUTH 2 (TWO) TIMES DAILY. TAKE 1 TAB ONCE DAILY FOR 7 DAYS AND THEN INCREASE TO 2 TABLETS BID.   cyanocobalamin 1000 MCG tablet Commonly known as: VITAMIN B12 Take 1,000 mcg by mouth daily.   DULoxetine 60 MG capsule Commonly known as: CYMBALTA Take 60 mg by mouth 2 (two) times daily.   GOODY HEADACHE PO Take 1 tablet by mouth as needed.   metFORMIN 500 MG tablet Commonly known as: GLUCOPHAGE Take by mouth 2 (two) times daily with a meal.   mometasone 50 MCG/ACT nasal spray Commonly known as: NASONEX Place 2 sprays into the nose daily.   pantoprazole 40 MG tablet Commonly known as: PROTONIX Take 1 tablet by mouth 2 (two) times daily before a meal.   rosuvastatin 20 MG tablet Commonly known as: CRESTOR Take 20 mg by mouth  daily.   Trelegy Ellipta 100-62.5-25 MCG/ACT Aepb Generic drug: Fluticasone-Umeclidin-Vilant Inhale 1 puff into the lungs daily in the afternoon.   valsartan-hydrochlorothiazide 160-12.5 MG tablet Commonly known as: DIOVAN-HCT Take 1 tablet by mouth daily.   Vitamin D (Ergocalciferol) 1.25 MG (50000 UNIT) Caps capsule Commonly known as: DRISDOL Take 1 capsule by mouth once a week. tuesdays        Signature:  Chesley Mires, MD [place] Pager - [phone] 08/30/2022, 9:26 AM

## 2022-09-15 ENCOUNTER — Encounter: Payer: Self-pay | Admitting: Pulmonary Disease

## 2022-09-17 ENCOUNTER — Telehealth: Payer: Self-pay | Admitting: Pulmonary Disease

## 2022-09-17 NOTE — Telephone Encounter (Signed)
Pt called the office regarding refill for 3 month supply if possible- please advise.

## 2022-09-18 MED ORDER — ALBUTEROL SULFATE HFA 108 (90 BASE) MCG/ACT IN AERS: 2.0000 | INHALATION_SPRAY | Freq: Four times a day (QID) | RESPIRATORY_TRACT | 3 refills | Status: DC | PRN

## 2022-09-19 ENCOUNTER — Ambulatory Visit: Payer: 59 | Admitting: Family Medicine

## 2022-09-19 NOTE — Telephone Encounter (Signed)
Called and spoke with patient. Patient stated that this was already taken care of.   Nothing further needed.

## 2022-09-27 ENCOUNTER — Encounter: Payer: Self-pay | Admitting: Pulmonary Disease

## 2022-09-27 NOTE — Telephone Encounter (Signed)
This usually is a virus and/or allergies.  She can use azelastine nasal spray one spray in each nostril in the morning and benadryl 25 mg at night.  She can get these over the counter with out a prescription.  She should continue using nasonex.  She can also take acetaminophen or ibuprofen to help with achy feeling.  If she develops a fever or her symptoms persist for more than 1 week she should call back to schedule an ROV.

## 2022-10-17 ENCOUNTER — Encounter: Payer: Self-pay | Admitting: Pulmonary Disease

## 2022-10-17 MED ORDER — ALBUTEROL SULFATE (2.5 MG/3ML) 0.083% IN NEBU: 2.5000 mg | INHALATION_SOLUTION | Freq: Four times a day (QID) | RESPIRATORY_TRACT | 3 refills | Status: DC | PRN

## 2022-10-31 ENCOUNTER — Encounter: Payer: Self-pay | Admitting: Pulmonary Disease

## 2022-10-31 NOTE — Telephone Encounter (Signed)
Can Dr. Halford Chessman please call me in something strong enough to knock out sinus headache? I have woke up with this headache the past 6 days. I can't blow much out. It feels like it is blocked and the top of my head feels like its going to explode. Please help me.    Dr. Halford Chessman please advise.

## 2022-10-31 NOTE — Telephone Encounter (Signed)
She needs visit to assess in more detail first.

## 2022-11-30 ENCOUNTER — Telehealth: Payer: Self-pay | Admitting: *Deleted

## 2022-11-30 NOTE — Telephone Encounter (Signed)
Can we check with pharmacy team to determine which brand of albuterol inhaler will be covered by her insurance.

## 2022-11-30 NOTE — Telephone Encounter (Signed)
Called and spoke w/ pt, I informed her we do not keep samples albuterol (Ventolin) beginning of April. Dr.Sood are there any alternatives that you could recommend.

## 2022-12-07 ENCOUNTER — Telehealth: Payer: Self-pay

## 2022-12-07 ENCOUNTER — Encounter: Payer: Self-pay | Admitting: Pulmonary Disease

## 2022-12-07 ENCOUNTER — Other Ambulatory Visit: Payer: Self-pay

## 2022-12-07 DIAGNOSIS — J441 Chronic obstructive pulmonary disease with (acute) exacerbation: Secondary | ICD-10-CM

## 2022-12-07 MED ORDER — AMOXICILLIN-POT CLAVULANATE 875-125 MG PO TABS: 1.0000 | ORAL_TABLET | Freq: Two times a day (BID) | ORAL | 0 refills | Status: DC

## 2022-12-07 MED ORDER — PREDNISONE 10 MG PO TABS: ORAL_TABLET | ORAL | 0 refills | Status: AC

## 2022-12-07 NOTE — Telephone Encounter (Signed)
Prescriptions sent to pts preferred pharmacy she is aware of Dr.Sood's recommendations and will call Monday if not better. Nothing further needed. Closing encounter.

## 2022-12-07 NOTE — Telephone Encounter (Signed)
Called and spoke with pt message sent to Dr.Sood and will route back to triage.

## 2022-12-07 NOTE — Telephone Encounter (Signed)
Dr. Halford Chessman pt called and states that 2 weeks ago she started feeling worse than her baseline. Symptoms are blood while blowing nose and drainage, coughing, chest tightness, and nauseated. She is also unable to use CPAP because of a tooth problem.   She wanted to move appt up from 3/19 but the only opening you have is in [place] and she is unable to come that far, because she lives in [place]

## 2022-12-07 NOTE — Telephone Encounter (Signed)
Can send script for augmentin 875 one pill bid for 7 days.  Can send script for prednisone 10 mg pill >> 3 pills daily for 2 days, 2 pills daily for 2 days, 1 pill daily for 2 days.  If not improved over the weekend, then she should call back to schedule a video visit with one of the NPs.

## 2022-12-11 ENCOUNTER — Other Ambulatory Visit (HOSPITAL_COMMUNITY): Payer: Self-pay

## 2022-12-11 NOTE — Telephone Encounter (Signed)
Dr.Sood please see message from prior auth team, I have called pt to let her know the information.

## 2022-12-11 NOTE — Telephone Encounter (Signed)
Noted  

## 2022-12-11 NOTE — Telephone Encounter (Signed)
Albuterol HFA 8.5gm is covered for the patient at this time, showing too soon to fill again due to last fill on 11/29/2022. Max quantity of 17gm (2inhalers) per 25 days.

## 2022-12-18 ENCOUNTER — Ambulatory Visit: Payer: 59 | Admitting: Pulmonary Disease

## 2023-01-01 ENCOUNTER — Ambulatory Visit: Payer: 59 | Admitting: Pulmonary Disease

## 2023-01-02 ENCOUNTER — Ambulatory Visit (INDEPENDENT_AMBULATORY_CARE_PROVIDER_SITE_OTHER): Payer: Medicare Other | Admitting: Pulmonary Disease

## 2023-01-02 ENCOUNTER — Encounter: Payer: Self-pay | Admitting: Pulmonary Disease

## 2023-01-02 VITALS — BP 140/88 | HR 98 | Temp 99.1°F | Ht 62.0 in | Wt 178.0 lb

## 2023-01-02 DIAGNOSIS — J449 Chronic obstructive pulmonary disease, unspecified: Secondary | ICD-10-CM

## 2023-01-02 DIAGNOSIS — G4733 Obstructive sleep apnea (adult) (pediatric): Secondary | ICD-10-CM

## 2023-01-02 DIAGNOSIS — J4489 Other specified chronic obstructive pulmonary disease: Secondary | ICD-10-CM

## 2023-01-02 DIAGNOSIS — J441 Chronic obstructive pulmonary disease with (acute) exacerbation: Secondary | ICD-10-CM | POA: Diagnosis not present

## 2023-01-02 DIAGNOSIS — R918 Other nonspecific abnormal finding of lung field: Secondary | ICD-10-CM

## 2023-01-02 DIAGNOSIS — Z720 Tobacco use: Secondary | ICD-10-CM | POA: Diagnosis not present

## 2023-01-02 DIAGNOSIS — J9611 Chronic respiratory failure with hypoxia: Secondary | ICD-10-CM

## 2023-01-02 DIAGNOSIS — J439 Emphysema, unspecified: Secondary | ICD-10-CM

## 2023-01-02 MED ORDER — AMOXICILLIN-POT CLAVULANATE 875-125 MG PO TABS: 1.0000 | ORAL_TABLET | Freq: Two times a day (BID) | ORAL | 0 refills | Status: DC

## 2023-01-02 MED ORDER — PREDNISONE 10 MG PO TABS: ORAL_TABLET | ORAL | 0 refills | Status: AC

## 2023-01-02 MED ORDER — ALBUTEROL SULFATE HFA 108 (90 BASE) MCG/ACT IN AERS: 2.0000 | INHALATION_SPRAY | Freq: Four times a day (QID) | RESPIRATORY_TRACT | 3 refills | Status: DC | PRN

## 2023-01-02 NOTE — Patient Instructions (Signed)
Augmentin 1 pill twice daily for 7 days.  Prednisone 10 mg pill >> 3 pills daily for 2 days, 2 pills daily for 2 days, 1 pill daily for 2 days.  Follow up in 4 months.

## 2023-01-02 NOTE — Progress Notes (Signed)
[facility]  Chief Complaint  Patient presents with   Follow-up    Breathing not doing well      Constitutional:  BP (!) 140/88 (BP Location: Left Arm, Patient Position: Sitting)   Pulse 98   Temp 99.1 F (37.3 C)   Ht 5\' 2"  (1.575 m)   Wt 178 lb (80.7 kg)   SpO2 96% Comment: ra  BMI 32.56 kg/m   Past Medical History:  Anxiety, OA, Asthma, Depression, HTN, Migraine HA  Past Surgical History:  She  has a past surgical history that includes Appendectomy; Laparoscopic cholecystectomy; Partial hysterectomy; Bladder surgery; ileum resection [id number]); and Colonoscopy (06/2018).  Brief Summary:  Jillian [name] is a 57 y.o. female smoker with obstructive sleep apnea and COPD with chronic hypoxic respiratory failure.      Subjective:   She has trouble with her teeth.  Had some pulled and might need more pulled.  This causes jaw pain and makes it difficult to wear her CPAP mask.  She has low grade temperature.  Has chest congestion with cough and yellow sputum.  Feels tight in her chest.  Still smoking cigarettes.  She has been getting bloated in her stomach.  Her breathing feels more labored when she gets bloated.  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 ONO with CPAP 11/23/21 >> test time 8 hrs 1 min.  Baseline SpO2 89%, SpO2 low 79%.  Spent 1 hr 43 min with an SpO2 < 88%. CPAP titration 01/18/22 >> CPAP 12 with 2 liters O2 >> AHI 0 CPAP 02/06/22 to 03/07/22 >> used on 30 of 30 nights with average 8 hrs 2 min.  Average AHI 0.6 with CPAP 12 cm H2O  Cardiac Tests:  Echo 08/14/22 >> EF 65 to 70%, grade 1 DD  Social History:  She  reports that she has been smoking cigarettes. She has a 64.50 pack-year smoking history. She has never used smokeless tobacco. She reports current alcohol use. She reports that she does not use drugs.  Family History:  Her family history includes COPD in her mother; Diabetes in her mother and sister; Heart attack in her father.     Assessment/Plan:   COPD with chronic bronchitis and emphysema. - she has an exacerbation - will give her course of prednisone and augmentin - continue trelegy 100 one puff daily - prn albuterol - she has a nebulizer machine  Obstructive sleep apnea. - she is compliant with therapy and reports benefit from CPAP use - she uses [employer] for her DME - current  CPAP ordered April 2022 - continue CPAP 12 cm H2O  Chronic respiratory failure with hypoxemia. - in setting of COPD and sleep apnea - she had O2 set up through [facility], but had to return her equipment because she couldn't afford the monthly payments - she will let us know when she is ready to have assessment for oxygen therapy again  Lung nodules. - follow up low dose CT chest in June 2024  Tobacco abuse. - encouraged her to keep up with smoking cessation efforts  Jaw pain. - she will follow up with her dentist  Abdominal bloating. - she will call if she needs a referral to gastroenterology    Time Spent Involved in Patient Care on Day of Examination:  38 minutes  Follow up:    Patient Instructions  Augmentin 1 pill twice daily for 7 days.  Prednisone 10 mg pill >> 3 pills daily for 2 days, 2 pills daily for 2 days, 1 pill daily for 2 days.  Follow up in 4 months.  Medication List:   Allergies as of 01/02/2023       Reactions   Doxycycline Photosensitivity   Broke out with blisters after being in the sun   Banana Hives        Medication List        Accurate as of January 02, 2023 11:17 AM. If you have any questions, ask your nurse or doctor.          albuterol (2.5 MG/3ML) 0.083% nebulizer solution Commonly known as: PROVENTIL Take 3 mLs (2.5 mg total) by nebulization every 6 (six) hours as needed for wheezing or shortness of breath.   albuterol 108 (90 Base) MCG/ACT inhaler Commonly known as: VENTOLIN HFA Inhale 2 puffs into the lungs every 6 (six) hours as needed for wheezing or shortness of breath.   ALKA-SELTZER HEARTBURN PO Take 1 tablet by mouth as needed (heartburn).   amoxicillin-clavulanate 875-125 MG tablet Commonly known as: AUGMENTIN Take 1 tablet by mouth 2 (two) times daily.   buPROPion 150 MG 12 hr tablet Commonly known as: WELLBUTRIN SR TAKE 1 TABLET (150 MG TOTAL) BY MOUTH 2 (TWO) TIMES DAILY. TAKE 1 TAB ONCE DAILY FOR 7 DAYS AND THEN INCREASE TO 2 TABLETS BID.   cyanocobalamin 1000 MCG tablet Commonly known as: VITAMIN B12 Take 1,000 mcg by mouth daily.   DULoxetine 60 MG capsule Commonly known as: CYMBALTA Take 60 mg by mouth 2 (two) times daily.   GOODY HEADACHE PO Take 1 tablet by mouth as needed.   metFORMIN 500 MG tablet Commonly known as: GLUCOPHAGE Take by mouth 2 (two) times daily with a meal.   mometasone 50 MCG/ACT nasal spray Commonly known as: NASONEX Place 2 sprays into the nose daily.   pantoprazole 40 MG tablet Commonly known as: PROTONIX Take 1 tablet by mouth 2 (two) times daily before a meal.   predniSONE 10 MG tablet Commonly known as: DELTASONE Take 3 tablets (30 mg total) by  mouth daily with breakfast for 2 days, THEN 2 tablets (20 mg total) daily with breakfast for 2 days, THEN 1 tablet (10 mg total) daily with breakfast for 2 days. Start taking on: January 02, 2023 Started by: Chesley Mires, MD   rosuvastatin 20 MG tablet Commonly known as: CRESTOR Take 20 mg by mouth daily.   Trelegy Ellipta 100-62.5-25 MCG/ACT Aepb Generic drug: Fluticasone-Umeclidin-Vilant Inhale 1 puff into the lungs daily in the afternoon. What changed: when to take this   valsartan-hydrochlorothiazide  160-12.5 MG tablet Commonly known as: DIOVAN-HCT Take 1 tablet by mouth daily.   Vitamin D (Ergocalciferol) 1.25 MG (50000 UNIT) Caps capsule Commonly known as: DRISDOL Take 1 capsule by mouth once a week. tuesdays        Signature:  Chesley Mires, MD [facility] Pager - [phone] 01/02/2023, 11:17 AM

## 2023-01-10 ENCOUNTER — Telehealth: Payer: Self-pay | Admitting: Pulmonary Disease

## 2023-01-10 NOTE — Telephone Encounter (Signed)
  PA team is there a way to do a PA on her albuterol inhaler or find a covered alternative with her insurance?   Thank you!

## 2023-01-11 ENCOUNTER — Other Ambulatory Visit (HOSPITAL_COMMUNITY): Payer: Self-pay

## 2023-01-11 NOTE — Telephone Encounter (Signed)
Per test claims it looks like the Albuterol HFA 8.5gm is covered, the claim is showing too soon to refill until 04/11 due to last fill of (I'm assuming a 3 month supply on 02/15. On 04/01 the insurance may be switching to the 18gm but we will not know that until that time, patient may be able to access the upcoming formulary to determine this.

## 2023-01-14 ENCOUNTER — Other Ambulatory Visit (HOSPITAL_COMMUNITY): Payer: Self-pay

## 2023-02-12 ENCOUNTER — Encounter: Payer: Self-pay | Admitting: Pulmonary Disease

## 2023-02-15 ENCOUNTER — Encounter: Payer: Self-pay | Admitting: Pulmonary Disease

## 2023-02-15 ENCOUNTER — Telehealth (INDEPENDENT_AMBULATORY_CARE_PROVIDER_SITE_OTHER): Payer: Medicare Other | Admitting: Pulmonary Disease

## 2023-02-15 DIAGNOSIS — G4733 Obstructive sleep apnea (adult) (pediatric): Secondary | ICD-10-CM

## 2023-02-15 DIAGNOSIS — R918 Other nonspecific abnormal finding of lung field: Secondary | ICD-10-CM | POA: Diagnosis not present

## 2023-02-15 DIAGNOSIS — J441 Chronic obstructive pulmonary disease with (acute) exacerbation: Secondary | ICD-10-CM

## 2023-02-15 DIAGNOSIS — J449 Chronic obstructive pulmonary disease, unspecified: Secondary | ICD-10-CM | POA: Diagnosis not present

## 2023-02-15 DIAGNOSIS — Z720 Tobacco use: Secondary | ICD-10-CM

## 2023-02-15 MED ORDER — FLUTICASONE PROPIONATE 50 MCG/ACT NA SUSP: 1.0000 | Freq: Every day | NASAL | 2 refills | Status: DC

## 2023-02-15 MED ORDER — PREDNISONE 10 MG PO TABS: ORAL_TABLET | ORAL | 0 refills | Status: DC

## 2023-02-15 NOTE — Progress Notes (Signed)
[facility]  Chief Complaint  Patient presents with   Acute Visit    Pt acute visit states that she is having head/nasal congestion. She just came home from [place] Saturday which is when her symptoms began. She is thinking the pollen is what is causing her issues.   Denies fevers - taking mucinex q12 hrs.     Constitutional:  There were no vitals taken for this visit.  Past Medical History:  Anxiety, OA, Asthma, Depression, HTN, Migraine HA  Past Surgical History:  She  has a past surgical history that includes Appendectomy; Laparoscopic cholecystectomy; Partial hysterectomy; Bladder surgery; ileum resection (6045); and Colonoscopy (06/2018).  Brief Summary:  Jillian [name] is a 57 y.o. female smoker with obstructive sleep apnea and COPD with chronic hypoxic respiratory failure.      Subjective:  Virtual Visit via Video Note  I connected with Jillian [name] on 02/15/23 at 11:45 AM EDT by a video enabled telemedicine application and verified that I am speaking with the correct person using two identifiers.  Location: Patient: home Provider: medical office   I discussed the limitations of evaluation and management by telemedicine and the availability of in person appointments. The patient expressed understanding and agreed to proceed.  History of Present Illness:  She was visiting family in [place].  When she returned to [place] she started having allergy trouble.  Having sinus congestion and getting clear drainage.  Has more cough and wheeze.  Chest feels tight.  No fever.  Chest sore from coughing.  She has appointment with [facility].  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  Physical Exam:  Alert, speech fluent, not using accessory muscles.    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 ONO with CPAP 11/23/21 >> test time 8 hrs 1 min.  Baseline SpO2 89%, SpO2 low 79%.  Spent 1 hr 43 min with an SpO2 < 88%. CPAP titration 01/18/22 >> CPAP 12 with 2 liters O2 >> AHI 0 CPAP 02/06/22 to 03/07/22 >> used on 30 of 30 nights with average 8 hrs 2 min.  Average AHI 0.6 with CPAP 12 cm H2O  Cardiac Tests:  Echo 08/14/22 >> EF 65 to 70%, grade 1 DD  Social History:  She  reports that she has been smoking cigarettes. She has a 64.50 pack-year smoking history. She has never used smokeless tobacco. She reports current alcohol use. She reports that she does not use drugs.  Family History:  Her family history includes COPD in her mother; Diabetes in her mother and sister; Heart attack in her father.     Assessment/Plan:   COPD with chronic bronchitis and emphysema. - has an exacerbation likely related to allergies and pollen - don't think she needs antibiotics at this time - will give her course of prednisone - continue trelegy 100 one puff daily - prn albuterol - she has a  nebulizer machine  Obstructive sleep apnea. - she is compliant with therapy and reports benefit from CPAP use - she uses [employer] for her DME - current CPAP ordered April 2022 - continue CPAP 12 cm H2O  Chronic respiratory failure with hypoxemia. - in setting of COPD and sleep apnea - she had O2 set up through [employer], but had to return her equipment because she couldn't afford the monthly payments - she will let us know when she is ready to have assessment for oxygen therapy again  Lung nodules. - follow up low dose CT chest in  June 2024  Tobacco abuse. 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 - discussed importance of smoking cessation - she has appointment with vascular surgery at [facility] - explained that she would be at increased risk of perioperative respiratory complications if she needs to have surgery, but these are not prohibitive  Time Spent Involved in Patient Care on Day of Examination:   I discussed the assessment and treatment plan with the patient. The patient was provided an opportunity to ask questions and all were answered. The patient agreed with the plan and demonstrated an understanding of the instructions.   The patient was advised to call back or seek an in-person evaluation if the symptoms worsen or if the condition fails to improve as anticipated.  I provided 37 minutes of non-face-to-face time during this encounter.  Follow up:   There are no Patient Instructions on file for this visit.  Medication List:   Allergies as of 02/15/2023       Reactions   Doxycycline Photosensitivity   Broke out with blisters after being in the sun   Banana Hives        Medication List        Accurate as of Feb 15, 2023 12:11 PM. If you have any questions, ask your nurse or doctor.          albuterol (2.5 MG/3ML) 0.083% nebulizer solution Commonly known as: PROVENTIL Take 3 mLs (2.5 mg total) by nebulization every 6 (six) hours as needed for wheezing or shortness of breath.   albuterol 108 (90 Base) MCG/ACT inhaler Commonly known as: VENTOLIN HFA Inhale 2 puffs into the lungs every 6 (six) hours as needed for wheezing or shortness of breath.   ALKA-SELTZER HEARTBURN PO Take 1 tablet by mouth as needed (heartburn).   amoxicillin-clavulanate 875-125 MG tablet Commonly known as: AUGMENTIN Take 1 tablet by mouth 2 (two) times daily.   buPROPion 150 MG 12 hr tablet Commonly known as: WELLBUTRIN SR TAKE 1 TABLET (150  MG TOTAL) BY MOUTH 2 (TWO) TIMES DAILY. TAKE 1 TAB ONCE DAILY FOR 7 DAYS AND THEN INCREASE TO 2 TABLETS BID.   cyanocobalamin 1000 MCG tablet Commonly known as: VITAMIN B12 Take 1,000 mcg by mouth daily.   DULoxetine 60 MG capsule Commonly known as: CYMBALTA Take 60 mg by mouth 2 (two) times daily.   GOODY HEADACHE PO Take 1 tablet by mouth as needed.   metFORMIN 500 MG tablet Commonly known as: GLUCOPHAGE Take by mouth 2 (two) times daily with a meal.   mometasone 50 MCG/ACT nasal spray Commonly known as: NASONEX Place 2 sprays into the nose daily.   pantoprazole 40 MG tablet Commonly known as: PROTONIX Take 1 tablet by mouth 2 (two) times daily before a meal.   rosuvastatin 20 MG tablet Commonly known as: CRESTOR Take 20 mg by mouth  daily.   Trelegy Ellipta 100-62.5-25 MCG/ACT Aepb Generic drug: Fluticasone-Umeclidin-Vilant Inhale 1 puff into the lungs daily in the afternoon. What changed: when to take this   valsartan-hydrochlorothiazide 160-12.5 MG tablet Commonly known as: DIOVAN-HCT Take 1 tablet by mouth daily.   Vitamin D (Ergocalciferol) 1.25 MG (50000 UNIT) Caps capsule Commonly known as: DRISDOL Take 1 capsule by mouth once a week. tuesdays        Signature:  Coralyn Helling, MD [facility] Pager - [phone] 02/15/2023, 12:11 PM

## 2023-02-15 NOTE — Patient Instructions (Signed)
Prednisone 10 mg pill >> 3 pills daily for 2 days, 2 pills daily for 2 days, 1 pill daily for 2 days  Flonase 1 spray in each nostril daily  Follow up in 10 weeks

## 2023-02-19 ENCOUNTER — Encounter: Payer: Self-pay | Admitting: Pulmonary Disease

## 2023-02-21 ENCOUNTER — Telehealth: Payer: Self-pay | Admitting: *Deleted

## 2023-02-21 MED ORDER — PREDNISONE 10 MG PO TABS: ORAL_TABLET | ORAL | 0 refills | Status: AC

## 2023-02-21 NOTE — Telephone Encounter (Signed)
Please let her know I have sent script for prednisone.

## 2023-02-21 NOTE — Telephone Encounter (Signed)
Dr.Sood pt told us that she was feeling better in 5/7 and denied wanting a f/u. She called today and reports that she is sluggish w/ no energy. She is requesting another round of prednisone. Please advise.

## 2023-02-21 NOTE — Addendum Note (Signed)
Addended by: Coralyn Helling on: 02/21/2023 06:30 PM   Modules accepted: Orders

## 2023-02-21 NOTE — Telephone Encounter (Signed)
Jillian [name] called and states she is feeling better but is still having some "sluggish and no energy" symptoms. Jillian [name] would like to see if Dr. Craige Cotta would prescribe her some prednisone. Jillian [name] states she is allergic to doxycyline.   Jillian [name] uses [facility].  Please call and advise Jillian [name]. [phone]

## 2023-03-02 ENCOUNTER — Other Ambulatory Visit: Payer: Self-pay | Admitting: Acute Care

## 2023-03-02 DIAGNOSIS — F17210 Nicotine dependence, cigarettes, uncomplicated: Secondary | ICD-10-CM

## 2023-03-02 DIAGNOSIS — Z122 Encounter for screening for malignant neoplasm of respiratory organs: Secondary | ICD-10-CM

## 2023-03-02 DIAGNOSIS — Z87891 Personal history of nicotine dependence: Secondary | ICD-10-CM

## 2023-03-27 ENCOUNTER — Other Ambulatory Visit: Payer: 59

## 2023-04-04 ENCOUNTER — Other Ambulatory Visit: Payer: 59

## 2023-04-29 ENCOUNTER — Other Ambulatory Visit: Payer: 59

## 2023-04-29 ENCOUNTER — Ambulatory Visit
Admission: RE | Admit: 2023-04-29 | Discharge: 2023-04-29 | Disposition: A | Discharge status: Home / Self Care | Payer: Medicare Other | Source: Ambulatory Visit | Attending: Acute Care | Admitting: Acute Care

## 2023-04-29 DIAGNOSIS — F17210 Nicotine dependence, cigarettes, uncomplicated: Secondary | ICD-10-CM

## 2023-04-29 DIAGNOSIS — Z122 Encounter for screening for malignant neoplasm of respiratory organs: Secondary | ICD-10-CM

## 2023-04-29 DIAGNOSIS — Z87891 Personal history of nicotine dependence: Secondary | ICD-10-CM

## 2023-05-02 ENCOUNTER — Telehealth: Payer: Self-pay | Admitting: Acute Care

## 2023-05-02 ENCOUNTER — Other Ambulatory Visit: Payer: Self-pay

## 2023-05-02 DIAGNOSIS — Z122 Encounter for screening for malignant neoplasm of respiratory organs: Secondary | ICD-10-CM

## 2023-05-02 DIAGNOSIS — F17210 Nicotine dependence, cigarettes, uncomplicated: Secondary | ICD-10-CM

## 2023-05-02 DIAGNOSIS — Z87891 Personal history of nicotine dependence: Secondary | ICD-10-CM

## 2023-05-02 NOTE — Telephone Encounter (Signed)
No answer by phone. Left VM to call back to review results of recent LDCT.

## 2023-05-02 NOTE — Telephone Encounter (Signed)
Patient returned call. Spoke with patient and informed her of the LDCT results and made specific mention of the aortic valve calcifications and to follow up with her PCP. Patient states she is scheduled to see a vascular surgeon on 8/1 for celiac artery stenosis. Advised patient I would send results to her PCP and to call their office if they have not followed up with her in 7-10 days and her PCP can decide if to defer to her [facility] vasc appt. 12 month repeat LDCT has already been ordered. Results sent to PCP. Pt verbalized understanding and denied any further questions or concerns at this time.    IMPRESSION: 1. Lung-RADS 2S, benign appearance or behavior. Continue annual screening with low-dose chest CT without contrast in 12 months. 2. The "S" modifier above refers to potentially clinically significant non lung cancer related findings. Specifically, there is aortic atherosclerosis, in addition to right coronary artery disease. Please note that although the presence of coronary artery calcium documents the presence of coronary artery disease, the severity of this disease and any potential stenosis cannot be assessed on this non-gated CT examination. Assessment for potential risk factor modification, dietary therapy or pharmacologic therapy may be warranted, if clinically indicated. 3. Mild diffuse bronchial wall thickening with mild centrilobular and paraseptal emphysema; imaging findings suggestive of underlying COPD. 4. There are calcifications of the aortic valve. Echocardiographic correlation for evaluation of potential valvular dysfunction may be warranted if clinically indicated.   Aortic Atherosclerosis (ICD10-I70.0) and Emphysema (ICD10-J43.9).     Electronically Signed   By: Trudie Reed M.D.   On: 05/02/2023 08:08

## 2023-05-27 ENCOUNTER — Ambulatory Visit: Payer: 59 | Admitting: Pulmonary Disease

## 2023-05-29 ENCOUNTER — Ambulatory Visit: Payer: Medicare Other | Admitting: Family Medicine

## 2023-06-04 ENCOUNTER — Encounter: Payer: Self-pay | Admitting: Pulmonary Disease

## 2023-06-06 ENCOUNTER — Telehealth: Payer: Self-pay | Admitting: Pulmonary Disease

## 2023-06-06 NOTE — Telephone Encounter (Signed)
Can schedule an appointment with Dr. Vassie Loll, or Dr. Wynona Neat.

## 2023-06-06 NOTE — Telephone Encounter (Signed)
Patient has a cough,wheezing,no energy and a heavy chest. Patient would like for some medication to be called in; zpack and prednisone.

## 2023-06-07 NOTE — Telephone Encounter (Signed)
Pt calling back to check on script

## 2023-06-07 NOTE — Telephone Encounter (Signed)
Okay to send prednisone 10 mg pill >> 3 pills daily for 2 days, 2 pills daily for 2 days, 1 pill daily for 2 days.  Okay to send zithromax 250 mg pill >> 2 pills on day 1, then 1 pill daily for next 4 days.

## 2023-06-10 MED ORDER — AZITHROMYCIN 250 MG PO TABS: ORAL_TABLET | ORAL | 0 refills | Status: DC

## 2023-06-10 MED ORDER — PREDNISONE 10 MG PO TABS: ORAL_TABLET | ORAL | 0 refills | Status: AC

## 2023-06-10 NOTE — Telephone Encounter (Signed)
Called and spoke with patient, advised of recommendations per Dr. Craige Cotta.  She verified understanding.  Verified pharmacy and scripts sent.  Advised to call back if she was not feeling better after she completed the mediations.  Nothing further needed.

## 2023-06-10 NOTE — Telephone Encounter (Signed)
Pt. Calling in again per note from Dr. Craige Cotta but meds have not got called in to pharmacy yet

## 2023-07-08 ENCOUNTER — Other Ambulatory Visit: Payer: Self-pay

## 2023-07-08 ENCOUNTER — Ambulatory Visit: Payer: Medicare Other | Admitting: Family Medicine

## 2023-07-08 ENCOUNTER — Telehealth: Payer: Self-pay | Admitting: Pulmonary Disease

## 2023-07-08 MED ORDER — TRELEGY ELLIPTA 100-62.5-25 MCG/ACT IN AEPB: 1.0000 | INHALATION_SPRAY | Freq: Every day | RESPIRATORY_TRACT | 3 refills | Status: DC

## 2023-07-08 NOTE — Telephone Encounter (Signed)
Patient states needs refill for Trelegy. Needs 3 month supply. Patient has 2 days left. Pharmacy is [facility] [place]. Patient phone number is [phone].

## 2023-07-08 NOTE — Telephone Encounter (Signed)
Called informed [facility] that [facility]

## 2023-07-08 NOTE — Telephone Encounter (Signed)
[facility]

## 2023-07-19 ENCOUNTER — Ambulatory Visit: Payer: Medicare Other | Attending: Internal Medicine | Admitting: Internal Medicine

## 2023-07-19 ENCOUNTER — Encounter: Payer: Self-pay | Admitting: Internal Medicine

## 2023-07-19 ENCOUNTER — Telehealth: Payer: Self-pay | Admitting: Internal Medicine

## 2023-07-19 VITALS — BP 100/64 | HR 90 | Ht 61.0 in | Wt 169.8 lb

## 2023-07-19 DIAGNOSIS — I779 Disorder of arteries and arterioles, unspecified: Secondary | ICD-10-CM

## 2023-07-19 DIAGNOSIS — E7849 Other hyperlipidemia: Secondary | ICD-10-CM

## 2023-07-19 DIAGNOSIS — M79662 Pain in left lower leg: Secondary | ICD-10-CM | POA: Insufficient documentation

## 2023-07-19 DIAGNOSIS — I701 Atherosclerosis of renal artery: Secondary | ICD-10-CM

## 2023-07-19 DIAGNOSIS — I774 Celiac artery compression syndrome: Secondary | ICD-10-CM | POA: Diagnosis present

## 2023-07-19 DIAGNOSIS — R0789 Other chest pain: Secondary | ICD-10-CM | POA: Diagnosis present

## 2023-07-19 DIAGNOSIS — M79661 Pain in right lower leg: Secondary | ICD-10-CM | POA: Diagnosis present

## 2023-07-19 DIAGNOSIS — I10 Essential (primary) hypertension: Secondary | ICD-10-CM | POA: Diagnosis present

## 2023-07-19 DIAGNOSIS — R079 Chest pain, unspecified: Secondary | ICD-10-CM | POA: Diagnosis not present

## 2023-07-19 DIAGNOSIS — M79606 Pain in leg, unspecified: Secondary | ICD-10-CM

## 2023-07-19 NOTE — Telephone Encounter (Signed)
Checking percert on the following patient for testing scheduled at [facility].    LEXISCAN 07/25/2023

## 2023-07-19 NOTE — Patient Instructions (Signed)
Medication Instructions:  Your physician recommends that you continue on your current medications as directed. Please refer to the Current Medication list given to you today.   Labwork: None  Testing/Procedures: Your physician has requested that you have an ankle brachial index (ABI). During this test an ultrasound and blood pressure cuff are used to evaluate the arteries that supply the arms and legs with blood. Allow thirty minutes for this exam. There are no restrictions or special instructions.  Your physician has requested that you have a lexiscan myoview. For further information please visit [email or web address] Please follow instruction sheet, as given.   Follow-Up: Your physician recommends that you schedule a follow-up appointment in: Pending Follow up  Any Other Special Instructions Will Be Listed Below (If Applicable).  If you need a refill on your cardiac medications before your next appointment, please call your pharmacy.

## 2023-07-22 ENCOUNTER — Telehealth: Payer: Self-pay | Admitting: Gastroenterology

## 2023-07-22 NOTE — Telephone Encounter (Signed)
Good afternoon Dr. Chales Abrahams,   We received a referral for this patient to be scheduled for a gastric emptying study and abdominal pain.[facility] states she needs a GI workup prior to getting surgery with them. She has last been seen by Melonie [place] in 2021 and a colonoscopy with [facility] in 2019. Records from previous visits are in Epic for your review. Patient states [facility] is unwilling to schedule her so she would like a transfer of care to our practice. Would you please review and advise on scheduling?  Thank you.

## 2023-07-22 NOTE — Telephone Encounter (Signed)
Extensive notes reviewed Patient lives in [place] Please follow-up with [facility] gastro as it is closer to home and since she has established patient there. I hate to put her through GI workup again which has already been done  RG

## 2023-07-23 NOTE — Telephone Encounter (Signed)
Inbound call from patient stating she will continue with [facility] due to being able to be seen sooner.

## 2023-07-24 ENCOUNTER — Encounter: Payer: Self-pay | Admitting: Internal Medicine

## 2023-07-24 ENCOUNTER — Ambulatory Visit (INDEPENDENT_AMBULATORY_CARE_PROVIDER_SITE_OTHER): Payer: Medicare Other | Admitting: Internal Medicine

## 2023-07-24 ENCOUNTER — Telehealth (HOSPITAL_COMMUNITY): Payer: Self-pay | Admitting: Emergency Medicine

## 2023-07-24 VITALS — BP 130/73 | HR 86 | Temp 98.2°F | Ht 61.0 in | Wt 173.9 lb

## 2023-07-24 DIAGNOSIS — K582 Mixed irritable bowel syndrome: Secondary | ICD-10-CM

## 2023-07-24 DIAGNOSIS — G8929 Other chronic pain: Secondary | ICD-10-CM

## 2023-07-24 DIAGNOSIS — Z791 Long term (current) use of non-steroidal anti-inflammatories (NSAID): Secondary | ICD-10-CM

## 2023-07-24 DIAGNOSIS — K219 Gastro-esophageal reflux disease without esophagitis: Secondary | ICD-10-CM

## 2023-07-24 DIAGNOSIS — R1013 Epigastric pain: Secondary | ICD-10-CM | POA: Diagnosis not present

## 2023-07-24 DIAGNOSIS — R140 Abdominal distension (gaseous): Secondary | ICD-10-CM

## 2023-07-24 DIAGNOSIS — R1033 Periumbilical pain: Secondary | ICD-10-CM | POA: Diagnosis not present

## 2023-07-24 MED ORDER — SUCRALFATE 1 G PO TABS: 1.0000 g | ORAL_TABLET | Freq: Four times a day (QID) | ORAL | 1 refills | Status: DC

## 2023-07-24 MED ORDER — OMEPRAZOLE 40 MG PO CPDR: 40.0000 mg | DELAYED_RELEASE_CAPSULE | Freq: Two times a day (BID) | ORAL | 11 refills | Status: DC

## 2023-07-24 NOTE — Progress Notes (Signed)
Primary Care Physician:  Toma Deiters, MD Primary Gastroenterologist:  Dr. Marletta Lor  Chief Complaint  Patient presents with   New Patient (Initial Visit)    Pt referred for persistent epigastric pain    HPI:   Jillian [name] is a 57 y.o. female who presents to clinic today by referral from her vascular surgery clinic, [facility] for evaluation for abdominal pain.  Patient with multiple GI complaints for me today.  Has had significant epigastric pain, constant, improves slightly with meals though then returns.  Reports of poor appetite eating only 1 meal a day however.  Pain is mild to moderate, does not radiate.  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  SMA and IMA patent.  Does note chronic NSAID use currently taking 4-5 Goody powders a day.  States she previously did take more than this and has cut back to current dosing as this is the only thing that helps her pain.  No melena hematochezia.  Chronically on omeprazole 20 mg daily.  Also complaining of issues with her bowels.  Primarily constipation, will go multiple days without bowel movements.  Does note significant straining at times.  When she has a bowel movement then she will proceed to have diarrhea multiple episodes afterwards.  Colonoscopy 2019 with healthy ileocolonic anastomosis, diverticulosis of the sigmoid and descending colons.  Biopsies unremarkable.  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  Past Medical History:  Diagnosis Date   Anxiety    Arthritis    Asthma    Depression    High blood pressure    History of bladder problems    Migraines    Sleep apnea     Past Surgical History:  Procedure Laterality Date    APPENDECTOMY     BLADDER SURGERY     x3   COLONOSCOPY  06/2018   Dr. Marca Ancona: Normal terminal ileum with normal biopsies.  Evidence of prior end to side ileocolonic anastomosis in the cecum.  This was patent and was characterized by healthy-appearing mucosa.  Multiple small and large mouth diverticula noted in the sigmoid colon and descending colon.  Random colon biopsies were benign.  Recommended colonoscopy in 10 years.   ileum resection  1982   LAPAROSCOPIC CHOLECYSTECTOMY     PARTIAL HYSTERECTOMY      Current Outpatient Medications  Medication Sig Dispense Refill   albuterol (PROVENTIL) (2.5 MG/3ML) 0.083% nebulizer solution Take 3 mLs (2.5 mg total) by nebulization every 6 (six) hours as needed for wheezing or shortness of breath. 360 mL 3   albuterol (VENTOLIN HFA) 108 (90 Base) MCG/ACT inhaler Inhale 2 puffs into the lungs every 6 (six) hours as needed for wheezing or shortness of breath. 18 g 3   Aspirin-Acetaminophen-Caffeine (GOODY HEADACHE PO) Take 1 tablet by mouth as needed.     DULoxetine (CYMBALTA) 60 MG capsule Take 60 mg by mouth 2 (two) times daily.      famotidine (PEPCID) 20 MG tablet Take 20 mg by mouth daily.     fluticasone (FLONASE) 50 MCG/ACT nasal spray Place 1 spray into both nostrils daily. 16 g 2   Fluticasone-Umeclidin-Vilant (TRELEGY ELLIPTA) 100-62.5-25 MCG/ACT AEPB Inhale 1 puff into the lungs daily in the  afternoon. 540 each 3   Magnesium 200 MG TABS Take 200 mg by mouth daily at 12 noon.     metFORMIN (GLUCOPHAGE) 500 MG tablet Take 500 mg by mouth 2 (two) times daily with a meal.     olmesartan (BENICAR) 20 MG tablet Take 20 mg by mouth daily.     omeprazole (PRILOSEC) 20 MG capsule Take 20 mg by mouth daily.     potassium chloride (KLOR-CON) 10 MEQ tablet Take 1 tablet by mouth daily.     rosuvastatin (CRESTOR) 20 MG tablet Take 20 mg by mouth daily.     traZODone (DESYREL) 50 MG tablet Take 50 mg by mouth at bedtime.     vitamin B-12 (CYANOCOBALAMIN) 1000  MCG tablet Take 1,000 mcg by mouth daily.     Sodium Bicarbonate-Citric Acid (ALKA-SELTZER HEARTBURN PO) Take 1 tablet by mouth as needed (heartburn). (Patient not taking: Reported on 07/24/2023)     No current facility-administered medications for this visit.    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[name]    Comment: currently on short term disability  Tobacco Use   Smoking status: Every Day    Current packs/day: 1.50    Average packs/day: 1.5 packs/day for 43.0 years (64.5 ttl pk-yrs)    Types: Cigarettes   Smokeless tobacco: Never   Tobacco comments:    Smokes a pack and a half a day [facility] 02/05/2022  Vaping Use   Vaping status: Never Used  Substance and Sexual Activity   Alcohol use: Yes    Comment: hardly ever   Drug use: No   Sexual activity: Not on file  Other Topics Concern   Not on file  Social History Narrative   Not on file   Social Determinants of Health   Financial Resource Strain: Low Risk  (08/13/2022)   Received from [facility], [facility]   Overall Financial Resource Strain (CARDIA)    Difficulty of Paying Living Expenses: Not very hard  Food Insecurity: No Food Insecurity (08/13/2022)   Received from [facility], [facility]   Hunger Vital Sign    Worried About Running Out of Food in the Last Year: Never true    Ran Out of Food in the  Last Year: Never true  Transportation Needs: No Transportation Needs (08/13/2022)   Received from [facility], [facility]   [facility] - Transportation    Lack of Transportation (Medical): No    Lack of Transportation (Non-Medical): No  Physical Activity: Not on file  Stress: Not on file  Social Connections: Not on file  Intimate Partner Violence: Not on file    Subjective: Review of Systems  Constitutional:  Negative for chills and fever.  HENT:  Negative for congestion and hearing loss.   Eyes:  Negative for blurred vision and double vision.  Respiratory:  Negative for cough and shortness of breath.   Cardiovascular:  Negative for chest pain and palpitations.  Gastrointestinal:  Positive for abdominal pain and constipation. Negative for blood in stool, diarrhea, heartburn, melena and vomiting.  Genitourinary:  Negative for dysuria and urgency.  Musculoskeletal:  Negative for joint pain and myalgias.  Skin:  Negative for itching and rash.  Neurological:  Negative for dizziness and headaches.  Psychiatric/Behavioral:  Negative for depression. The patient is not nervous/anxious.        Objective: BP 130/73   Pulse 86   Temp 98.2 F (36.8 C)   Ht 5\' 1"  (1.549 m)   Wt 173 lb 14.4 oz (78.9 kg)   BMI 32.86 kg/m  Physical Exam Constitutional:      Appearance: Normal appearance.  HENT:     Head: Normocephalic and atraumatic.  Eyes:     Extraocular Movements: Extraocular movements intact.     Conjunctiva/sclera: Conjunctivae normal.  Cardiovascular:     Rate and Rhythm: Normal rate and regular rhythm.  Pulmonary:     Effort: Pulmonary effort is normal.     Breath sounds: Normal breath sounds.  Abdominal:     General: Bowel sounds are normal.     Palpations: Abdomen is soft.  Musculoskeletal:        General: No swelling. Normal range of motion.     Cervical back: Normal range of motion and neck supple.  Skin:    General: Skin is warm and dry.     Coloration:  Skin is not jaundiced.  Neurological:     General: No focal deficit present.     Mental Status: She is alert and oriented to person, place, and time.  Psychiatric:        Mood and Affect: Mood normal.        Behavior: Behavior normal.      Assessment/Plan:  1.  Epigastric pain, GERD, abdominal bloating-chronic, worsening. Patient notes taking 4-5 Goody powders daily.  Concern for peptic ulcer disease.  Will schedule for urgent EGD to evaluate for peptic ulcer disease, esophagitis, gastritis, H. Pylori, duodenitis, or other. Will also evaluate for esophageal stricture, Schatzki's ring, esophageal web or other.   The risks including infection, bleed, or perforation as well as benefits, limitations, alternatives and imponderables have been reviewed with the patient. Potential for esophageal dilation, biopsy, etc. have also been reviewed.  Questions have been answered. All parties agreeable.  I will increase her omeprazole to 40 mg twice daily.  Carafate up to 4 times a day as needed for breakthrough symptoms.  CT angiogram concerning for possible MALS.  Currently being worked up by vascular surgery clinic in this regard.  May need gastric imaging study if upper endoscopy unremarkable.  2.  Chronic constipation with overflow diarrhea- For patient's constipation, I recommended taking MiraLAX 1 capful daily.  If this is not adequate then increase to 2 capfuls daily.  If this is still not adequate then add on once daily Dulcolax.  Encouraged to stay well-hydrated.  May need to consider trial of Linzess pending response.   Follow-up after EGD.  Thank you Doroteo Glassman for the kind referral.  07/24/2023 9:29 AM   Disclaimer: This note was dictated with voice recognition software. Similar sounding words can inadvertently be transcribed and may not be corrected upon review.

## 2023-07-24 NOTE — H&P (View-Only) (Signed)
Primary Care Physician:  Toma Deiters, MD Primary Gastroenterologist:  Dr. Marletta Lor  Chief Complaint  Patient presents with   New Patient (Initial Visit)    Pt referred for persistent epigastric pain    HPI:   Jillian [name] is a 57 y.o. female who presents to clinic today by referral from her vascular surgery clinic, [facility] for evaluation for abdominal pain.  Patient with multiple GI complaints for me today.  Has had significant epigastric pain, constant, improves slightly with meals though then returns.  Reports of poor appetite eating only 1 meal a day however.  Pain is mild to moderate, does not radiate.  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  SMA and IMA patent.  Does note chronic NSAID use currently taking 4-5 Goody powders a day.  States she previously did take more than this and has cut back to current dosing as this is the only thing that helps her pain.  No melena hematochezia.  Chronically on omeprazole 20 mg daily.  Also complaining of issues with her bowels.  Primarily constipation, will go multiple days without bowel movements.  Does note significant straining at times.  When she has a bowel movement then she will proceed to have diarrhea multiple episodes afterwards.  Colonoscopy 2019 with healthy ileocolonic anastomosis, diverticulosis of the sigmoid and descending colons.  Biopsies unremarkable.  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  Past Medical History:  Diagnosis Date   Anxiety    Arthritis    Asthma    Depression    High blood pressure    History of bladder problems    Migraines    Sleep apnea     Past Surgical History:  Procedure Laterality Date    APPENDECTOMY     BLADDER SURGERY     x3   COLONOSCOPY  06/2018   Dr. Marca Ancona: Normal terminal ileum with normal biopsies.  Evidence of prior end to side ileocolonic anastomosis in the cecum.  This was patent and was characterized by healthy-appearing mucosa.  Multiple small and large mouth diverticula noted in the sigmoid colon and descending colon.  Random colon biopsies were benign.  Recommended colonoscopy in 10 years.   ileum resection  1982   LAPAROSCOPIC CHOLECYSTECTOMY     PARTIAL HYSTERECTOMY      Current Outpatient Medications  Medication Sig Dispense Refill   albuterol (PROVENTIL) (2.5 MG/3ML) 0.083% nebulizer solution Take 3 mLs (2.5 mg total) by nebulization every 6 (six) hours as needed for wheezing or shortness of breath. 360 mL 3   albuterol (VENTOLIN HFA) 108 (90 Base) MCG/ACT inhaler Inhale 2 puffs into the lungs every 6 (six) hours as needed for wheezing or shortness of breath. 18 g 3   Aspirin-Acetaminophen-Caffeine (GOODY HEADACHE PO) Take 1 tablet by mouth as needed.     DULoxetine (CYMBALTA) 60 MG capsule Take 60 mg by mouth 2 (two) times daily.      famotidine (PEPCID) 20 MG tablet Take 20 mg by mouth daily.     fluticasone (FLONASE) 50 MCG/ACT nasal spray Place 1 spray into both nostrils daily. 16 g 2   Fluticasone-Umeclidin-Vilant (TRELEGY ELLIPTA) 100-62.5-25 MCG/ACT AEPB Inhale 1 puff into the lungs daily in the  afternoon. 540 each 3   Magnesium 200 MG TABS Take 200 mg by mouth daily at 12 noon.     metFORMIN (GLUCOPHAGE) 500 MG tablet Take 500 mg by mouth 2 (two) times daily with a meal.     olmesartan (BENICAR) 20 MG tablet Take 20 mg by mouth daily.     omeprazole (PRILOSEC) 20 MG capsule Take 20 mg by mouth daily.     potassium chloride (KLOR-CON) 10 MEQ tablet Take 1 tablet by mouth daily.     rosuvastatin (CRESTOR) 20 MG tablet Take 20 mg by mouth daily.     traZODone (DESYREL) 50 MG tablet Take 50 mg by mouth at bedtime.     vitamin B-12 (CYANOCOBALAMIN) 1000  MCG tablet Take 1,000 mcg by mouth daily.     Sodium Bicarbonate-Citric Acid (ALKA-SELTZER HEARTBURN PO) Take 1 tablet by mouth as needed (heartburn). (Patient not taking: Reported on 07/24/2023)     No current facility-administered medications for this visit.    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[name]    Comment: currently on short term disability  Tobacco Use   Smoking status: Every Day    Current packs/day: 1.50    Average packs/day: 1.5 packs/day for 43.0 years (64.5 ttl pk-yrs)    Types: Cigarettes   Smokeless tobacco: Never   Tobacco comments:    Smokes a pack and a half a day [facility] 02/05/2022  Vaping Use   Vaping status: Never Used  Substance and Sexual Activity   Alcohol use: Yes    Comment: hardly ever   Drug use: No   Sexual activity: Not on file  Other Topics Concern   Not on file  Social History Narrative   Not on file   Social Determinants of Health   Financial Resource Strain: Low Risk  (08/13/2022)   Received from [facility], [facility]   Overall Financial Resource Strain (CARDIA)    Difficulty of Paying Living Expenses: Not very hard  Food Insecurity: No Food Insecurity (08/13/2022)   Received from [facility], [facility]   Hunger Vital Sign    Worried About Running Out of Food in the Last Year: Never true    Ran Out of Food in the  Last Year: Never true  Transportation Needs: No Transportation Needs (08/13/2022)   Received from [facility], [facility]   [facility] - Transportation    Lack of Transportation (Medical): No    Lack of Transportation (Non-Medical): No  Physical Activity: Not on file  Stress: Not on file  Social Connections: Not on file  Intimate Partner Violence: Not on file    Subjective: Review of Systems  Constitutional:  Negative for chills and fever.  HENT:  Negative for congestion and hearing loss.   Eyes:  Negative for blurred vision and double vision.  Respiratory:  Negative for cough and shortness of breath.   Cardiovascular:  Negative for chest pain and palpitations.  Gastrointestinal:  Positive for abdominal pain and constipation. Negative for blood in stool, diarrhea, heartburn, melena and vomiting.  Genitourinary:  Negative for dysuria and urgency.  Musculoskeletal:  Negative for joint pain and myalgias.  Skin:  Negative for itching and rash.  Neurological:  Negative for dizziness and headaches.  Psychiatric/Behavioral:  Negative for depression. The patient is not nervous/anxious.        Objective: BP 130/73   Pulse 86   Temp 98.2 F (36.8 C)   Ht 5\' 1"  (1.549 m)   Wt 173 lb 14.4 oz (78.9 kg)   BMI 32.86 kg/m  Physical Exam Constitutional:      Appearance: Normal appearance.  HENT:     Head: Normocephalic and atraumatic.  Eyes:     Extraocular Movements: Extraocular movements intact.     Conjunctiva/sclera: Conjunctivae normal.  Cardiovascular:     Rate and Rhythm: Normal rate and regular rhythm.  Pulmonary:     Effort: Pulmonary effort is normal.     Breath sounds: Normal breath sounds.  Abdominal:     General: Bowel sounds are normal.     Palpations: Abdomen is soft.  Musculoskeletal:        General: No swelling. Normal range of motion.     Cervical back: Normal range of motion and neck supple.  Skin:    General: Skin is warm and dry.     Coloration:  Skin is not jaundiced.  Neurological:     General: No focal deficit present.     Mental Status: She is alert and oriented to person, place, and time.  Psychiatric:        Mood and Affect: Mood normal.        Behavior: Behavior normal.      Assessment/Plan:  1.  Epigastric pain, GERD, abdominal bloating-chronic, worsening. Patient notes taking 4-5 Goody powders daily.  Concern for peptic ulcer disease.  Will schedule for urgent EGD to evaluate for peptic ulcer disease, esophagitis, gastritis, H. Pylori, duodenitis, or other. Will also evaluate for esophageal stricture, Schatzki's ring, esophageal web or other.   The risks including infection, bleed, or perforation as well as benefits, limitations, alternatives and imponderables have been reviewed with the patient. Potential for esophageal dilation, biopsy, etc. have also been reviewed.  Questions have been answered. All parties agreeable.  I will increase her omeprazole to 40 mg twice daily.  Carafate up to 4 times a day as needed for breakthrough symptoms.  CT angiogram concerning for possible MALS.  Currently being worked up by vascular surgery clinic in this regard.  May need gastric imaging study if upper endoscopy unremarkable.  2.  Chronic constipation with overflow diarrhea- For patient's constipation, I recommended taking MiraLAX 1 capful daily.  If this is not adequate then increase to 2 capfuls daily.  If this is still not adequate then add on once daily Dulcolax.  Encouraged to stay well-hydrated.  May need to consider trial of Linzess pending response.   Follow-up after EGD.  Thank you Doroteo Glassman for the kind referral.  07/24/2023 9:29 AM   Disclaimer: This note was dictated with voice recognition software. Similar sounding words can inadvertently be transcribed and may not be corrected upon review.

## 2023-07-24 NOTE — Patient Instructions (Addendum)
We will schedule you for upper endoscopy to further evaluate your epigastric pain.  I am going to increase your omeprazole to 40 mg twice daily.  I will also send in Carafate to take on top of this as needed up to 4 times a day.  We may need to further work you up for potential MALS though we will proceed with upper endoscopy first.  For your constipation, I want you to start taking over the counter MiraLAX 1 capful daily.  If this does not adequately control your constipation, I would increase to 2 capfuls daily.  If this is still not adequate, then I would add on once daily Dulcolax (bisacodyl) tablet.  Be sure to stay well hydrated.   It was very nice meeting you today.  Dr. Marletta Lor

## 2023-07-24 NOTE — Telephone Encounter (Signed)
Reviewed Lexiscan protocol, patient understands and will arrive at 10 am on 07/25/2023.

## 2023-07-25 ENCOUNTER — Encounter (HOSPITAL_BASED_OUTPATIENT_CLINIC_OR_DEPARTMENT_OTHER)
Admission: RE | Admit: 2023-07-25 | Discharge: 2023-07-25 | Disposition: A | Discharge status: Home / Self Care | Payer: Medicare Other | Source: Ambulatory Visit | Attending: Internal Medicine | Admitting: Internal Medicine

## 2023-07-25 ENCOUNTER — Encounter: Payer: Self-pay | Admitting: *Deleted

## 2023-07-25 ENCOUNTER — Ambulatory Visit (HOSPITAL_COMMUNITY)
Admission: RE | Admit: 2023-07-25 | Discharge: 2023-07-25 | Disposition: A | Discharge status: Home / Self Care | Payer: Medicare Other | Source: Ambulatory Visit | Attending: Internal Medicine | Admitting: Internal Medicine

## 2023-07-25 DIAGNOSIS — R079 Chest pain, unspecified: Secondary | ICD-10-CM | POA: Insufficient documentation

## 2023-07-25 LAB — NM MYOCAR MULTI W/SPECT W/WALL MOTION / EF
Base ST Depression (mm): 0 mm
LV dias vol: 65 mL (ref 46–106)
LV sys vol: 14 mL
Nuc Stress EF: 78 %
Peak HR: 98 {beats}/min
RATE: 0.5
Rest HR: 88 {beats}/min
Rest Nuclear Isotope Dose: 11 mCi
SDS: 1
SRS: 2
SSS: 3
ST Depression (mm): 0 mm
Stress Nuclear Isotope Dose: 33 mCi
TID: 0.98

## 2023-07-25 MED ORDER — SODIUM CHLORIDE FLUSH 0.9 % IV SOLN
INTRAVENOUS | Status: AC
  Administered 2023-07-25: 10 mL via INTRAVENOUS
  Filled 2023-07-25: qty 10

## 2023-07-25 MED ORDER — TECHNETIUM TC 99M TETROFOSMIN IV KIT
30.0000 | PACK | Freq: Once | INTRAVENOUS | Status: AC | PRN
  Administered 2023-07-25: 33 via INTRAVENOUS

## 2023-07-25 MED ORDER — REGADENOSON 0.4 MG/5ML IV SOLN
INTRAVENOUS | Status: AC
  Administered 2023-07-25: 0.4 mg via INTRAVENOUS
  Filled 2023-07-25: qty 5

## 2023-07-25 MED ORDER — TECHNETIUM TC 99M TETROFOSMIN IV KIT
10.0000 | PACK | Freq: Once | INTRAVENOUS | Status: AC | PRN
  Administered 2023-07-25: 11 via INTRAVENOUS

## 2023-07-31 DIAGNOSIS — I10 Essential (primary) hypertension: Secondary | ICD-10-CM | POA: Insufficient documentation

## 2023-07-31 DIAGNOSIS — R0789 Other chest pain: Secondary | ICD-10-CM | POA: Insufficient documentation

## 2023-07-31 DIAGNOSIS — E782 Mixed hyperlipidemia: Secondary | ICD-10-CM | POA: Insufficient documentation

## 2023-07-31 DIAGNOSIS — I779 Disorder of arteries and arterioles, unspecified: Secondary | ICD-10-CM | POA: Insufficient documentation

## 2023-07-31 DIAGNOSIS — I774 Celiac artery compression syndrome: Secondary | ICD-10-CM | POA: Insufficient documentation

## 2023-07-31 DIAGNOSIS — M79661 Pain in right lower leg: Secondary | ICD-10-CM | POA: Insufficient documentation

## 2023-07-31 DIAGNOSIS — I701 Atherosclerosis of renal artery: Secondary | ICD-10-CM | POA: Insufficient documentation

## 2023-07-31 DIAGNOSIS — E785 Hyperlipidemia, unspecified: Secondary | ICD-10-CM | POA: Insufficient documentation

## 2023-07-31 NOTE — Progress Notes (Signed)
Cardiology Office Note  Date: 07/31/2023   ID: Jillian [name], DOB 06/28/1966, MRN [id number]  PCP:  Toma Deiters, MD  Cardiologist:  Marjo Bicker, MD Electrophysiologist:  None   History of Present Illness: Jillian [name] is a 57 y.o. 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  Chronic ongoing chest pains for a few years, noticing it now with exertion.  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  She also was noted to have lipomatous hypertrophy of the atrial septum. Lower extremity ABIs were within normal limits in 2021 but left TBI was abnormal.  She continues to have pain in both extremities with exertion.  No DOE, orthopnea, PND, palpitations, presyncope and syncope.  Past Medical History:  Diagnosis Date   Anxiety    Arthritis    Asthma    Depression    High blood pressure    History of bladder problems    Migraines    Sleep apnea     Past Surgical History:  Procedure Laterality Date   APPENDECTOMY     BLADDER SURGERY     x3   COLONOSCOPY  06/2018   Dr. Marca Ancona: Normal terminal ileum with normal biopsies.  Evidence of prior end to side ileocolonic anastomosis in the cecum.  This was patent and was characterized by healthy-appearing mucosa.  Multiple small and large mouth diverticula noted in the sigmoid colon and descending colon.  Random colon biopsies were benign.  Recommended colonoscopy in 10 years.   ileum resection  1982   LAPAROSCOPIC CHOLECYSTECTOMY     PARTIAL HYSTERECTOMY      Current Outpatient Medications  Medication Sig Dispense Refill   albuterol (PROVENTIL) (2.5 MG/3ML) 0.083% nebulizer solution Take 3 mLs (2.5 mg total) by nebulization every 6 (six) hours as needed for wheezing or shortness of breath. 360 mL 3    albuterol (VENTOLIN HFA) 108 (90 Base) MCG/ACT inhaler Inhale 2 puffs into the lungs every 6 (six) hours as needed for wheezing or shortness of breath. 18 g 3   Aspirin-Acetaminophen-Caffeine (GOODY HEADACHE PO) Take 1 tablet by mouth as needed.     DULoxetine (CYMBALTA) 60 MG capsule Take 60 mg by mouth 2 (two) times daily.      famotidine (PEPCID) 20 MG tablet Take 20 mg by mouth daily.     fluticasone (FLONASE) 50 MCG/ACT nasal spray Place 1 spray into both nostrils daily. 16 g 2   Fluticasone-Umeclidin-Vilant (TRELEGY ELLIPTA) 100-62.5-25 MCG/ACT AEPB Inhale 1 puff into the lungs daily in the afternoon. 540 each 3   Magnesium 200 MG TABS Take 200 mg by mouth daily at 12 noon.     metFORMIN (GLUCOPHAGE) 500 MG tablet Take 500 mg by mouth 2 (two) times daily with a meal.     olmesartan (BENICAR) 20 MG tablet Take 20 mg by mouth daily.     potassium chloride (KLOR-CON) 10 MEQ tablet Take 1 tablet by mouth daily.     rosuvastatin (CRESTOR) 20 MG tablet Take 20 mg by mouth daily.     traZODone (DESYREL) 50 MG tablet Take 50 mg by mouth at bedtime.     vitamin B-12 (CYANOCOBALAMIN) 1000 MCG tablet Take 1,000 mcg by mouth daily.     omeprazole (PRILOSEC) 40 MG capsule Take  1 capsule (40 mg total) by mouth 2 (two) times daily before a meal. 60 capsule 11   sucralfate (CARAFATE) 1 g tablet Take 1 tablet (1 g total) by mouth 4 (four) times daily. 120 tablet 1   No current facility-administered medications for this visit.   Allergies:  Doxycycline and Banana   Social History: The patient  reports that she has been smoking cigarettes. She has a 64.5 pack-year smoking history. She has never used smokeless tobacco. She reports current alcohol use. She reports that she does not use drugs.   Family History: The patient's family history includes COPD in her mother; Diabetes in her mother and sister; Heart attack in her father.   ROS:  Please see the history of present illness. Otherwise, complete review  of systems is positive for none  All other systems are reviewed and negative.   Physical Exam: VS:  BP 100/64   Pulse 90   Ht 5\' 1"  (1.549 m)   Wt 169 lb 12.8 oz (77 kg)   SpO2 94%   BMI 32.08 kg/m , BMI Body mass index is 32.08 kg/m.  Wt Readings from Last 3 Encounters:  07/24/23 173 lb 14.4 oz (78.9 kg)  07/19/23 169 lb 12.8 oz (77 kg)  01/02/23 178 lb (80.7 kg)    General: Patient appears comfortable at rest. HEENT: Conjunctiva and lids normal, oropharynx clear with moist mucosa. Neck: Supple, no elevated JVP or carotid bruits, no thyromegaly. Lungs: Clear to auscultation, nonlabored breathing at rest. Cardiac: Regular rate and rhythm, no S3 or significant systolic murmur, no pericardial rub. Abdomen: Soft, nontender, no hepatomegaly, bowel sounds present, no guarding or rebound. Extremities: No pitting edema, distal pulses 2+. Skin: Warm and dry. Musculoskeletal: No kyphosis. Neuropsychiatric: Alert and oriented x3, affect grossly appropriate.  Recent Labwork: No results found for requested labs within last 365 days.  No results found for: "CHOL", "TRIG", "HDL", "CHOLHDL", "VLDL", "LDLCALC", "LDLDIRECT"   PVL mesenteric artery duplex on 07/04/2023 at [facility] Mesenteric: Normal splenic artery and hepatic artery findings. 70 to 99% stenosis in the celiac artery and superior mesenteric artery. Other: Interrogation of the mesenteric arteries was limited due to the limitations listed above.   CT angio abdomen pelvis with and without contrast on 07/08/2023 at [facility] Mild stenosis at the origin of the celiac artery which becomes severe on the expiratory phase imaging; with prominent overlying soft tissue and prominent pancreaticoduodenal arterial arcade - which can be seen in the setting of median arcuate ligament syndrome.   Severe calcified atherosclerotic disease of the visualized aorta with multiple areas of penetrating atherosclerotic ulcer noted in the distal descending thoracic  aorta and proximal abdominal aorta which appear overall similar in size and morphology to prior.   Severe stenosis of the left renal artery with associated cortical atrophy.   Echocardiogram in 07/2022 at [facility] Normal LVEF G1 DD No valvular heart disease   Assessment and Plan:  Chest pressure: Ongoing chronic chest pains but recently noticed chest pressures with exertion, CT cardiac from 2021 showed coronary calcium score 22 which is 85th percentile for age and sex matched control, ischemia less likely, will obtain NM stress test.  Median arcuate ligament syndrome (MALS): CT angio abdomen in 9/24 confirmed median arcuate ligament syndrome compressing celiac artery and SMA and probably causing chronic stomach pains.  She also has weight loss for which she was referred to GI, will undergo urgent EGD soon.  Follows up with vascular surgery at [facility] and GI in [place].  Isolated PAU: CT angio abdomen pelvis with contrast in 07/08/2023 showed multiple areas of penetrating atherosclerotic ulcer in the distal descending thoracic aorta and proximal abdominal aorta which appeared overall similar in size and morphology compared to the prior.  No high risk findings present.  Serial imaging every 6 months for 2 years recommended.  Follows up with vascular surgery at [facility].  Severe stenosis of the left renal artery with associated cortical atrophy: Normal serum creatinine, HTN adequately controlled, follows up with vascular surgery at [facility].  Pain in bilateral lower extremities, r/o PAD: Lower extremity ABIs were within normal limits in 2021 but left TBI was abnormal. Obtain ABI of bilateral lower EXTR.  HTN, controlled: Continue olmesartan 20 mg once daily  HLD, at goal: Continue rosuvastatin 20 mg nightly.   I have spent a total duration of 45 minutes, reviewing records from [facility] Everywhere (vascular surgery notes, imaging studies including mesenteric Doppler, CT abdomen pelvis), prior cardiology notes,  imaging studies, face-to-face discussion about her medical condition, answering all her questions, ordering test/imaging and documenting the findings in the note.   Disposition:  Follow up  pending results  Signed Anarosa Kubisiak Verne Spurr, MD, 07/31/2023 9:30 AM    [facility] [address] [place], [place], [place] [address]

## 2023-08-01 ENCOUNTER — Ambulatory Visit: Payer: Medicare Other | Attending: Internal Medicine

## 2023-08-01 DIAGNOSIS — M79605 Pain in left leg: Secondary | ICD-10-CM

## 2023-08-01 DIAGNOSIS — M79604 Pain in right leg: Secondary | ICD-10-CM

## 2023-08-01 DIAGNOSIS — M79606 Pain in leg, unspecified: Secondary | ICD-10-CM | POA: Insufficient documentation

## 2023-08-01 LAB — VAS US ABI WITH/WO TBI
Left ABI: 1.11
Right ABI: 1.21

## 2023-08-02 ENCOUNTER — Encounter: Payer: Self-pay | Admitting: *Deleted

## 2023-08-05 ENCOUNTER — Telehealth: Payer: Self-pay | Admitting: Internal Medicine

## 2023-08-05 NOTE — Telephone Encounter (Signed)
Pt states her PCP has not received tests that were sent over last week. She asked that they be faxed over again - fax number [facility]) - [phone]

## 2023-08-05 NOTE — Telephone Encounter (Signed)
Printed and faxed to [facility]

## 2023-08-06 NOTE — Patient Instructions (Signed)
Jillian [name]  08/06/2023     @PREFPERIOPPHARMACY @   Your procedure is scheduled on  08/12/2023.   Report to [name] at  1245  P.M.   Call this number if you have problems the morning of surgery:  [phone]  If you experience any cold or flu symptoms such as cough, fever, chills, shortness of breath, etc. between now and your scheduled surgery, please notify us at the above number.   Remember:  Follow the diet and prep instructions given to you by the office.    You may drink clear liquids until 1045 am on 08/12/2023.    Clear liquids allowed are:                    Water, Carbonated beverages (diabetics please choose diet or no sugar options), Black Coffee Only (No creamer, milk or cream, including half & half and powdered creamer), and Clear Sports drink (No red color; diabetics please choose diet or no sugar options)    Take these medicines the morning of surgery with A SIP OF WATER                         duloxetine, pepcid, omeprazole.     Do not wear jewelry, make-up or nail polish, including gel polish,  artificial nails, or any other type of covering on natural nails (fingers and  toes).  Do not wear lotions, powders, or perfumes, or deodorant.  Do not shave 48 hours prior to surgery.  Men may shave face and neck.  Do not bring valuables to the hospital.  [facility] is not responsible for any belongings or valuables.  Contacts, dentures or bridgework may not be worn into surgery.  Leave your suitcase in the car.  After surgery it may be brought to your room.  For patients admitted to the hospital, discharge time will be determined by your treatment team.  Patients discharged the day of surgery will not be allowed to drive home and must have someone with them for 24 hours.    Special instructions:   DO NOT smoke tobacco or vape for 24 hours before your procedure.  Please read over the following fact sheets that you were given. Anesthesia Post-op  Instructions and Care and Recovery After Surgery      Upper Endoscopy, Adult, Care After After the procedure, it is common to have a sore throat. It is also common to have: Mild stomach pain or discomfort. Bloating. Nausea. Follow these instructions at home: The instructions below may help you care for yourself at home. Your health care provider may give you more instructions. If you have questions, ask your health care provider. If you were given a sedative during the procedure, it can affect you for several hours. Do not drive or operate machinery until your health care provider says that it is safe. If you will be going home right after the procedure, plan to have a responsible adult: Take you home from the hospital or clinic. You will not be allowed to drive. Care for you for the time you are told. Follow instructions from your health care provider about what you may eat and drink. Return to your normal activities as told by your health care provider. Ask your health care provider what activities are safe for you. Take over-the-counter and prescription medicines only as told by your health care provider. Contact a health care provider if  you: Have a sore throat that lasts longer than one day. Have trouble swallowing. Have a fever. Get help right away if you: Vomit blood or your vomit looks like coffee grounds. Have bloody, black, or tarry stools. Have a very bad sore throat or you cannot swallow. Have difficulty breathing or very bad pain in your chest or abdomen. These symptoms may be an emergency. Get help right away. Call 911. Do not wait to see if the symptoms will go away. Do not drive yourself to the hospital. Summary After the procedure, it is common to have a sore throat, mild stomach discomfort, bloating, and nausea. If you were given a sedative during the procedure, it can affect you for several hours. Do not drive until your health care provider says that it is  safe. Follow instructions from your health care provider about what you may eat and drink. Return to your normal activities as told by your health care provider. This information is not intended to replace advice given to you by your health care provider. Make sure you discuss any questions you have with your health care provider. Document Revised: 01/10/2022 Document Reviewed: 01/10/2022 Elsevier Patient Education  2024 Elsevier Inc. Monitored Anesthesia Care, Care After The following information offers guidance on how to care for yourself after your procedure. Your health care provider may also give you more specific instructions. If you have problems or questions, contact your health care provider. What can I expect after the procedure? After the procedure, it is common to have: Tiredness. Little or no memory about what happened during or after the procedure. Impaired judgment when it comes to making decisions. Nausea or vomiting. Some trouble with balance. Follow these instructions at home: For the time period you were told by your health care provider:  Rest. Do not participate in activities where you could fall or become injured. Do not drive or use machinery. Do not drink alcohol. Do not take sleeping pills or medicines that cause drowsiness. Do not make important decisions or sign legal documents. Do not take care of children on your own. Medicines Take over-the-counter and prescription medicines only as told by your health care provider. If you were prescribed antibiotics, take them as told by your health care provider. Do not stop using the antibiotic even if you start to feel better. Eating and drinking Follow instructions from your health care provider about what you may eat and drink. Drink enough fluid to keep your urine pale yellow. If you vomit: Drink clear fluids slowly and in small amounts as you are able. Clear fluids include water, ice chips, low-calorie sports  drinks, and fruit juice that has water added to it (diluted fruit juice). Eat light and bland foods in small amounts as you are able. These foods include bananas, applesauce, rice, lean meats, toast, and crackers. General instructions  Have a responsible adult stay with you for the time you are told. It is important to have someone help care for you until you are awake and alert. If you have sleep apnea, surgery and some medicines can increase your risk for breathing problems. Follow instructions from your health care provider about wearing your sleep device: When you are sleeping. This includes during daytime naps. While taking prescription pain medicines, sleeping medicines, or medicines that make you drowsy. Do not use any products that contain nicotine or tobacco. These products include cigarettes, chewing tobacco, and vaping devices, such as e-cigarettes. If you need help quitting, ask your health care provider.  Contact a health care provider if: You feel nauseous or vomit every time you eat or drink. You feel light-headed. You are still sleepy or having trouble with balance after 24 hours. You get a rash. You have a fever. You have redness or swelling around the IV site. Get help right away if: You have trouble breathing. You have new confusion after you get home. These symptoms may be an emergency. Get help right away. Call 911. Do not wait to see if the symptoms will go away. Do not drive yourself to the hospital. This information is not intended to replace advice given to you by your health care provider. Make sure you discuss any questions you have with your health care provider. Document Revised: 02/26/2022 Document Reviewed: 02/26/2022 Elsevier Patient Education  2024 [employer].

## 2023-08-07 ENCOUNTER — Encounter (HOSPITAL_COMMUNITY): Payer: Self-pay

## 2023-08-07 ENCOUNTER — Encounter (HOSPITAL_COMMUNITY)
Admission: RE | Admit: 2023-08-07 | Discharge: 2023-08-07 | Disposition: A | Discharge status: Home / Self Care | Payer: Medicare Other | Source: Ambulatory Visit | Attending: Internal Medicine | Admitting: Internal Medicine

## 2023-08-07 VITALS — BP 130/73 | HR 86 | Temp 98.2°F | Resp 18 | Ht 61.0 in | Wt 173.9 lb

## 2023-08-07 DIAGNOSIS — Z01812 Encounter for preprocedural laboratory examination: Secondary | ICD-10-CM | POA: Insufficient documentation

## 2023-08-07 DIAGNOSIS — R7303 Prediabetes: Secondary | ICD-10-CM | POA: Diagnosis not present

## 2023-08-07 DIAGNOSIS — Z01818 Encounter for other preprocedural examination: Secondary | ICD-10-CM | POA: Diagnosis present

## 2023-08-07 HISTORY — DX: Type 2 diabetes mellitus without complications: E11.9

## 2023-08-07 HISTORY — DX: Chronic obstructive pulmonary disease, unspecified: J44.9

## 2023-08-07 LAB — BASIC METABOLIC PANEL
Anion gap: 12 (ref 5–15)
BUN: 22 mg/dL — ABNORMAL HIGH (ref 6–20)
CO2: 21 mmol/L — ABNORMAL LOW (ref 22–32)
Calcium: 9 mg/dL (ref 8.9–10.3)
Chloride: 103 mmol/L (ref 98–111)
Creatinine, Ser: 1.26 mg/dL — ABNORMAL HIGH (ref 0.44–1.00)
GFR, Estimated: 49 mL/min — ABNORMAL LOW (ref >60)
Glucose, Bld: 153 mg/dL — ABNORMAL HIGH (ref 70–99)
Potassium: 4.5 mmol/L (ref 3.5–5.1)
Sodium: 136 mmol/L (ref 135–145)

## 2023-08-12 ENCOUNTER — Ambulatory Visit (HOSPITAL_COMMUNITY): Payer: Medicare Other | Admitting: Certified Registered"

## 2023-08-12 ENCOUNTER — Encounter (HOSPITAL_COMMUNITY)
Admission: RE | Disposition: A | Discharge status: Home / Self Care | Payer: Self-pay | Source: Home / Self Care | Attending: Internal Medicine

## 2023-08-12 ENCOUNTER — Ambulatory Visit (HOSPITAL_BASED_OUTPATIENT_CLINIC_OR_DEPARTMENT_OTHER): Payer: Medicare Other | Admitting: Certified Registered"

## 2023-08-12 ENCOUNTER — Ambulatory Visit (HOSPITAL_COMMUNITY)
Admission: RE | Admit: 2023-08-12 | Discharge: 2023-08-12 | Disposition: A | Discharge status: Home / Self Care | Payer: Medicare Other | Attending: Internal Medicine | Admitting: Internal Medicine

## 2023-08-12 ENCOUNTER — Encounter (HOSPITAL_COMMUNITY): Payer: Self-pay

## 2023-08-12 DIAGNOSIS — K315 Obstruction of duodenum: Secondary | ICD-10-CM

## 2023-08-12 DIAGNOSIS — K5909 Other constipation: Secondary | ICD-10-CM | POA: Insufficient documentation

## 2023-08-12 DIAGNOSIS — F419 Anxiety disorder, unspecified: Secondary | ICD-10-CM | POA: Insufficient documentation

## 2023-08-12 DIAGNOSIS — J449 Chronic obstructive pulmonary disease, unspecified: Secondary | ICD-10-CM | POA: Diagnosis not present

## 2023-08-12 DIAGNOSIS — I10 Essential (primary) hypertension: Secondary | ICD-10-CM | POA: Diagnosis not present

## 2023-08-12 DIAGNOSIS — K219 Gastro-esophageal reflux disease without esophagitis: Secondary | ICD-10-CM | POA: Insufficient documentation

## 2023-08-12 DIAGNOSIS — R634 Abnormal weight loss: Secondary | ICD-10-CM | POA: Insufficient documentation

## 2023-08-12 DIAGNOSIS — K2950 Unspecified chronic gastritis without bleeding: Secondary | ICD-10-CM

## 2023-08-12 DIAGNOSIS — G4730 Sleep apnea, unspecified: Secondary | ICD-10-CM | POA: Diagnosis not present

## 2023-08-12 DIAGNOSIS — Z79899 Other long term (current) drug therapy: Secondary | ICD-10-CM | POA: Diagnosis not present

## 2023-08-12 DIAGNOSIS — R1013 Epigastric pain: Secondary | ICD-10-CM | POA: Diagnosis present

## 2023-08-12 DIAGNOSIS — F17210 Nicotine dependence, cigarettes, uncomplicated: Secondary | ICD-10-CM | POA: Diagnosis not present

## 2023-08-12 DIAGNOSIS — J4489 Other specified chronic obstructive pulmonary disease: Secondary | ICD-10-CM | POA: Diagnosis not present

## 2023-08-12 DIAGNOSIS — F32A Depression, unspecified: Secondary | ICD-10-CM | POA: Diagnosis not present

## 2023-08-12 DIAGNOSIS — K269 Duodenal ulcer, unspecified as acute or chronic, without hemorrhage or perforation: Secondary | ICD-10-CM | POA: Diagnosis not present

## 2023-08-12 DIAGNOSIS — Z7984 Long term (current) use of oral hypoglycemic drugs: Secondary | ICD-10-CM | POA: Insufficient documentation

## 2023-08-12 DIAGNOSIS — Z6832 Body mass index (BMI) 32.0-32.9, adult: Secondary | ICD-10-CM | POA: Insufficient documentation

## 2023-08-12 DIAGNOSIS — R109 Unspecified abdominal pain: Secondary | ICD-10-CM | POA: Diagnosis not present

## 2023-08-12 DIAGNOSIS — E1151 Type 2 diabetes mellitus with diabetic peripheral angiopathy without gangrene: Secondary | ICD-10-CM | POA: Diagnosis not present

## 2023-08-12 DIAGNOSIS — R140 Abdominal distension (gaseous): Secondary | ICD-10-CM | POA: Insufficient documentation

## 2023-08-12 DIAGNOSIS — R12 Heartburn: Secondary | ICD-10-CM | POA: Diagnosis not present

## 2023-08-12 DIAGNOSIS — K259 Gastric ulcer, unspecified as acute or chronic, without hemorrhage or perforation: Secondary | ICD-10-CM

## 2023-08-12 HISTORY — PX: BIOPSY: SHX5522

## 2023-08-12 HISTORY — PX: ESOPHAGOGASTRODUODENOSCOPY (EGD) WITH PROPOFOL: SHX5813

## 2023-08-12 LAB — GLUCOSE, CAPILLARY: Glucose-Capillary: 100 mg/dL — ABNORMAL HIGH (ref 70–99)

## 2023-08-12 SURGERY — ESOPHAGOGASTRODUODENOSCOPY (EGD) WITH PROPOFOL
Anesthesia: General

## 2023-08-12 MED ORDER — PHENYLEPHRINE 80 MCG/ML (10ML) SYRINGE FOR IV PUSH (FOR BLOOD PRESSURE SUPPORT)
PREFILLED_SYRINGE | INTRAVENOUS | Status: AC
  Filled 2023-08-12: qty 10

## 2023-08-12 MED ORDER — LACTATED RINGERS IV SOLN: INTRAVENOUS | Status: DC | PRN

## 2023-08-12 MED ORDER — STERILE WATER FOR IRRIGATION IR SOLN
Status: DC | PRN
  Administered 2023-08-12: 60 mL

## 2023-08-12 MED ORDER — LIDOCAINE HCL (CARDIAC) PF 100 MG/5ML IV SOSY
PREFILLED_SYRINGE | INTRAVENOUS | Status: DC | PRN
  Administered 2023-08-12: 50 mg via INTRAVENOUS

## 2023-08-12 MED ORDER — PHENYLEPHRINE 80 MCG/ML (10ML) SYRINGE FOR IV PUSH (FOR BLOOD PRESSURE SUPPORT)
PREFILLED_SYRINGE | INTRAVENOUS | Status: DC | PRN
  Administered 2023-08-12: 40 ug via INTRAVENOUS
  Administered 2023-08-12: 50 ug via INTRAVENOUS
  Administered 2023-08-12: 160 ug via INTRAVENOUS

## 2023-08-12 MED ORDER — PROPOFOL 10 MG/ML IV BOLUS
INTRAVENOUS | Status: DC | PRN
  Administered 2023-08-12: 120 mg via INTRAVENOUS
  Administered 2023-08-12: 40 mg via INTRAVENOUS

## 2023-08-12 NOTE — Anesthesia Procedure Notes (Signed)
Date/Time: 08/12/2023 7:45 AM  Performed by: Julian Reil, CRNAPre-anesthesia Checklist: Emergency Drugs available, Suction available, Patient being monitored and Patient identified Patient Re-evaluated:Patient Re-evaluated prior to induction Oxygen Delivery Method: Nasal cannula Induction Type: IV induction Placement Confirmation: positive ETCO2 Comments: Optiflow High Flow [place] O2 used.

## 2023-08-12 NOTE — Interval H&P Note (Signed)
History and Physical Interval Note:  08/12/2023 7:37 AM  Jillian [name]  has presented today for surgery, with the diagnosis of persistant epi pain.  The various methods of treatment have been discussed with the patient and family. After consideration of risks, benefits and other options for treatment, the patient has consented to  Procedure(s) with comments: ESOPHAGOGASTRODUODENOSCOPY (EGD) WITH PROPOFOL (N/A) - 245pm, asa 3 as a surgical intervention.  The patient's history has been reviewed, patient examined, no change in status, stable for surgery.  I have reviewed the patient's chart and labs.  Questions were answered to the patient's satisfaction.     Lanelle Bal

## 2023-08-12 NOTE — Discharge Instructions (Signed)
EGD Discharge instructions Please read the instructions outlined below and refer to this sheet in the next few weeks. These discharge instructions provide you with general information on caring for yourself after you leave the hospital. Your doctor may also give you specific instructions. While your treatment has been planned according to the most current medical practices available, unavoidable complications occasionally occur. If you have any problems or questions after discharge, please call your doctor. ACTIVITY You may resume your regular activity but move at a slower pace for the next 24 hours.  Take frequent rest periods for the next 24 hours.  Walking will help expel (get rid of) the air and reduce the bloated feeling in your abdomen.  No driving for 24 hours (because of the anesthesia (medicine) used during the test).  You may shower.  Do not sign any important legal documents or operate any machinery for 24 hours (because of the anesthesia used during the test).  NUTRITION Drink plenty of fluids.  You may resume your normal diet.  Begin with a light meal and progress to your normal diet.  Avoid alcoholic beverages for 24 hours or as instructed by your caregiver.  MEDICATIONS You may resume your normal medications unless your caregiver tells you otherwise.  WHAT YOU CAN EXPECT TODAY You may experience abdominal discomfort such as a feeling of fullness or "gas" pains.  FOLLOW-UP Your doctor will discuss the results of your test with you.  SEEK IMMEDIATE MEDICAL ATTENTION IF ANY OF THE FOLLOWING OCCUR: Excessive nausea (feeling sick to your stomach) and/or vomiting.  Severe abdominal pain and distention (swelling).  Trouble swallowing.  Temperature over 101 F (37.8 C).  Rectal bleeding or vomiting of blood.   Your upper endoscopy revealed significant inflammation in your stomach with multiple superficial ulcers.  I took biopsies to rule infection with bacteria called H.  pylori.  You had a medium size cratered ulcer at the end portion of your stomach.  I took biopsies of this as well.  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  I opened this up slightly today.  Continue on omeprazole twice daily.  It is imperative that you avoid NSAIDs as best as you can.  Tylenol okay.  We will need to repeat upper endoscopy in 8 to 12 weeks to evaluate healing.  I may stretch the stricture further at that time.  Follow-up in GI office in 6 weeks   I hope you have a great rest of your week!  Jillian [name]. Marletta Lor, D.O. Gastroenterology and Hepatology [facility]

## 2023-08-12 NOTE — Anesthesia Preprocedure Evaluation (Signed)
Anesthesia Evaluation  Patient identified by MRN, date of birth, ID band Patient awake    Reviewed: Allergy & Precautions, H&P , NPO status , Patient's Chart, lab work & pertinent test results, reviewed documented beta blocker date and time   Airway Mallampati: II  TM Distance: >3 FB Neck ROM: full    Dental no notable dental hx.    Pulmonary neg pulmonary ROS, shortness of breath, asthma , sleep apnea , COPD, Current Smoker   Pulmonary exam normal breath sounds clear to auscultation       Cardiovascular Exercise Tolerance: Good hypertension, + Peripheral Vascular Disease  negative cardio ROS  Rhythm:regular Rate:Normal     Neuro/Psych  Headaches PSYCHIATRIC DISORDERS Anxiety Depression    negative neurological ROS  negative psych ROS   GI/Hepatic negative GI ROS, Neg liver ROS,,,  Endo/Other  negative endocrine ROSdiabetes    Renal/GU negative Renal ROS  negative genitourinary   Musculoskeletal   Abdominal   Peds  Hematology negative hematology ROS (+)   Anesthesia Other Findings   Reproductive/Obstetrics negative OB ROS                             Anesthesia Physical Anesthesia Plan  ASA: 3  Anesthesia Plan: General   Post-op Pain Management:    Induction:   PONV Risk Score and Plan: Propofol infusion  Airway Management Planned:   Additional Equipment:   Intra-op Plan:   Post-operative Plan:   Informed Consent: I have reviewed the patients History and Physical, chart, labs and discussed the procedure including the risks, benefits and alternatives for the proposed anesthesia with the patient or authorized representative who has indicated his/her understanding and acceptance.     Dental Advisory Given  Plan Discussed with: CRNA  Anesthesia Plan Comments:        Anesthesia Quick Evaluation

## 2023-08-12 NOTE — Transfer of Care (Signed)
Immediate Anesthesia Transfer of Care Note  Patient: [name]  Procedure(s) Performed: ESOPHAGOGASTRODUODENOSCOPY (EGD) WITH PROPOFOL BIOPSY  Patient Location: Short Stay  Anesthesia Type:General  Level of Consciousness: drowsy  Airway & Oxygen Therapy: Patient Spontanous Breathing  Post-op Assessment: Report given to RN and Post -op Vital signs reviewed and stable  Post vital signs: Reviewed and stable  Last Vitals:  Vitals Value Taken Time  BP 128/48 08/12/23 0756  Temp 36.9 C 08/12/23 0756  Pulse 96 08/12/23 0756  Resp 18 08/12/23 0756  SpO2 96 % 08/12/23 0756    Last Pain:  Vitals:   08/12/23 0756  TempSrc: Axillary  PainSc: Asleep         Complications: No notable events documented.

## 2023-08-12 NOTE — Anesthesia Postprocedure Evaluation (Signed)
Anesthesia Post Note  Patient: Jillian [name]  Procedure(s) Performed: ESOPHAGOGASTRODUODENOSCOPY (EGD) WITH PROPOFOL BIOPSY  Patient location during evaluation: Phase II Anesthesia Type: General Level of consciousness: awake Pain management: pain level controlled Vital Signs Assessment: post-procedure vital signs reviewed and stable Respiratory status: spontaneous breathing and respiratory function stable Cardiovascular status: blood pressure returned to baseline and stable Postop Assessment: no headache and no apparent nausea or vomiting Anesthetic complications: no Comments: Late entry   No notable events documented.   Last Vitals:  Vitals:   08/12/23 0756 08/12/23 0758  BP: (!) 128/48 (!) 103/47  Pulse: 96 96  Resp: 18 16  Temp: 36.9 C   SpO2: 96% 95%    Last Pain:  Vitals:   08/12/23 0758  TempSrc:   PainSc: 0-No pain                 Windell Norfolk

## 2023-08-12 NOTE — Op Note (Signed)
[facility] Patient Name: Jillian [name] Procedure Date: 08/12/2023 7:08 AM MRN: [id number] Date of Birth: [birth year] Attending MD: Hennie Duos. Marletta Lor , [place], [id number] CSN: [id number] Age: 57 Admit Type: Outpatient Procedure:                Upper GI endoscopy Indications:              Epigastric abdominal pain, Heartburn, Weight loss Providers:                Hennie Duos. Marletta Lor, DO, Emilee Tubb RN, RN, [employer], [employer]. Jessee Avers, Technician Referring MD:              Medicines:                See the Anesthesia note for documentation of the                            administered medications Complications:            No immediate complications. Estimated Blood Loss:     Estimated blood loss was minimal. Procedure:                Pre-Anesthesia Assessment:                           - The anesthesia plan was to use monitored                            anesthesia care (MAC).                           After obtaining informed consent, the endoscope was                            passed under direct vision. Throughout the                            procedure, the patient's blood pressure, pulse, and                            oxygen saturations were monitored continuously. The                            [id number]-H190 ([id number]) scope was introduced through the                            mouth, and advanced to the second part of duodenum.                            The upper GI endoscopy was accomplished without                            difficulty. The patient tolerated the procedure  well. Scope In: 7:44:13 AM Scope Out: 7:53:17 AM Total Procedure Duration: 0 hours 9 minutes 4 seconds  Findings:      There is no endoscopic evidence of esophagitis or inflammation in the       entire esophagus.      Diffuse moderate inflammation characterized by erosions, erythema and       shallow ulcerations was found in the entire examined  stomach. Biopsies       were taken with a cold forceps for Helicobacter pylori testing.      One non-bleeding cratered gastric ulcer with a clean ulcer base (Forrest       Class III) was found at the pylorus. The lesion was 8 mm in largest       dimension. Biopsies were taken with a cold forceps for histology.      An acquired benign-appearing, intrinsic moderate stenosis was found in       the first portion of the duodenum and was traversed. Mucusal disruption       occured with passage of endoscope. Biopsies were taken with a cold       forceps for histology. Impression:               - Gastritis. Biopsied.                           - Non-bleeding gastric ulcer with a clean ulcer                            base (Forrest Class III). Biopsied.                           - Acquired duodenal stenosis. Biopsied. Moderate Sedation:      Per Anesthesia Care Recommendation:           - Patient has a contact number available for                            emergencies. The signs and symptoms of potential                            delayed complications were discussed with the                            patient. Return to normal activities tomorrow.                            Written discharge instructions were provided to the                            patient.                           - Resume previous diet.                           - Use a proton pump inhibitor PO BID.                           - Use sucralfate tablets 1  gram PO QID.                           - AVOID ALL NSAIDS (patient with history of Goody                            powder use)                           - Repeat upper endoscopy in 8-12 weeks to evaluate                            the response to therapy.                           - Return to GI office in 6 weeks. Procedure Code(s):        --- Professional ---                           [phone], Esophagogastroduodenoscopy, flexible,                            transoral; with  biopsy, single or multiple Diagnosis Code(s):        --- Professional ---                           K29.70, Gastritis, unspecified, without bleeding                           K25.9, Gastric ulcer, unspecified as acute or                            chronic, without hemorrhage or perforation                           K31.5, Obstruction of duodenum                           R10.13, Epigastric pain                           R12, Heartburn                           R63.4, Abnormal weight loss CPT copyright 2022 American Medical Association. All rights reserved. The codes documented in this report are preliminary and upon coder review may  be revised to meet current compliance requirements. Hennie Duos. Marletta Lor, DO Hennie Duos. Marletta Lor, DO 08/12/2023 7:58:40 AM This report has been signed electronically. Number of Addenda: 0

## 2023-08-13 LAB — SURGICAL PATHOLOGY

## 2023-08-16 ENCOUNTER — Encounter (HOSPITAL_COMMUNITY): Payer: Self-pay | Admitting: Internal Medicine

## 2023-08-19 ENCOUNTER — Encounter: Payer: Self-pay | Admitting: Internal Medicine

## 2023-08-19 ENCOUNTER — Encounter (HOSPITAL_BASED_OUTPATIENT_CLINIC_OR_DEPARTMENT_OTHER): Payer: Self-pay | Admitting: Pulmonary Disease

## 2023-08-19 NOTE — Telephone Encounter (Signed)
noted 

## 2023-08-20 NOTE — Telephone Encounter (Signed)
Patient needs a refill of albuterol sulfate handinhaler . Is a former patient of Dr.Sood. 3 month supply.  Pharmacy: [facility] in [place]

## 2023-08-27 ENCOUNTER — Encounter: Payer: Self-pay | Admitting: Internal Medicine

## 2023-08-28 MED ORDER — ALBUTEROL SULFATE HFA 108 (90 BASE) MCG/ACT IN AERS: 2.0000 | INHALATION_SPRAY | Freq: Four times a day (QID) | RESPIRATORY_TRACT | 0 refills | Status: DC | PRN

## 2023-09-18 ENCOUNTER — Ambulatory Visit: Payer: Medicare Other | Admitting: Internal Medicine

## 2023-09-30 ENCOUNTER — Encounter: Payer: Self-pay | Admitting: *Deleted

## 2023-10-13 ENCOUNTER — Encounter (HOSPITAL_BASED_OUTPATIENT_CLINIC_OR_DEPARTMENT_OTHER): Payer: Self-pay | Admitting: Pulmonary Disease

## 2023-10-16 MED ORDER — TRELEGY ELLIPTA 100-62.5-25 MCG/ACT IN AEPB: 1.0000 | INHALATION_SPRAY | Freq: Every day | RESPIRATORY_TRACT | 0 refills | Status: DC

## 2023-10-21 ENCOUNTER — Encounter (HOSPITAL_BASED_OUTPATIENT_CLINIC_OR_DEPARTMENT_OTHER): Payer: Self-pay | Admitting: Pulmonary Disease

## 2023-10-21 ENCOUNTER — Telehealth (HOSPITAL_BASED_OUTPATIENT_CLINIC_OR_DEPARTMENT_OTHER): Payer: Medicare Other | Admitting: Pulmonary Disease

## 2023-10-21 ENCOUNTER — Telehealth: Payer: Self-pay | Admitting: Pulmonary Disease

## 2023-10-21 ENCOUNTER — Encounter (HOSPITAL_BASED_OUTPATIENT_CLINIC_OR_DEPARTMENT_OTHER): Payer: Self-pay

## 2023-10-21 DIAGNOSIS — J441 Chronic obstructive pulmonary disease with (acute) exacerbation: Secondary | ICD-10-CM

## 2023-10-21 DIAGNOSIS — J189 Pneumonia, unspecified organism: Secondary | ICD-10-CM

## 2023-10-21 MED ORDER — PREDNISONE 10 MG PO TABS: ORAL_TABLET | ORAL | 0 refills | Status: DC

## 2023-10-21 MED ORDER — ALBUTEROL SULFATE HFA 108 (90 BASE) MCG/ACT IN AERS: 2.0000 | INHALATION_SPRAY | Freq: Four times a day (QID) | RESPIRATORY_TRACT | 0 refills | Status: DC | PRN

## 2023-10-21 NOTE — Telephone Encounter (Signed)
 Patient appointment has already been changed to Mychart virtual visit with Dr. Vassie Loll.  Nothing further at this time.

## 2023-10-21 NOTE — Telephone Encounter (Signed)
 Patient would like today's appointment to be a mychart video visit. Patient phone number is [phone].

## 2023-10-21 NOTE — Telephone Encounter (Signed)
 Visit changed to video and mychart message sent to patient.

## 2023-10-21 NOTE — Progress Notes (Signed)
 Subjective:    Patient ID: Jillian [name], female    DOB: 12-16-1965, 58 y.o.   MRN: [id number]  HPI   I connected with  Jillian [name] on 10/21/23 by  video enabled telemedicine application and verified that I am speaking with the correct person using two identifiers.     Location: Patient: Home Provider: Office - [facility] [employer]   I discussed the limitations of evaluation and management by telemedicine and the availability of in person appointments. The patient expressed understanding and agreed to proceed. I also discussed with the patient that there may be a patient responsible charge related to this service. The patient expressed understanding and agreed to proceed.   Patient consented to consult : Yes People present and their role in pt care: Pt   58 y.o. female smoker with obstructive sleep apnea and COPD with chronic hypoxic respiratory failure.  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   Discussed the use of AI scribe software for clinical note transcription with the patient, who gave verbal consent to proceed.  History of Present Illness   The patient, with a history of COPD, presents with persistent respiratory symptoms despite recent treatment for pneumonia. She reports feeling 'terrible' and 'not better' after a recent emergency room visit 1/1 where she was treated with prednisone  and levofloxacin for pneumonia. She describes her chest as 'heavy and tight' and notes that her inhalers are providing no relief. She also reports wheezing, mainly at night, and a cough that is sometimes productive and sometimes dry. She notes that her throat started hurting around Saturday, which was not a symptom she had previously. She also reports aches all over her body, particularly in  her upper back. She has been on amoxicillin  for head congestion, which she finished yesterday. She is currently on Trelegy for her COPD and has an albuterol  nebulizer and inhaler, though she reports that the inhaler does not provide relief and the nebulizer medication is expired. She also reports that she is still smoking.       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 ONO with CPAP 11/23/21 >> test time 8 hrs 1 min.  Baseline SpO2 89%, SpO2 low 79%.  Spent 1 hr 43 min with an SpO2 < 88%. CPAP titration 01/18/22 >> CPAP 12 with 2 liters O2 >> AHI 0 CPAP 02/06/22 to 03/07/22 >> used on 30 of 30 nights with average 8 hrs 2 min.  Average AHI 0.6 with CPAP 12 cm H2O    Review of Systems neg for any significant sore throat, dysphagia, itching, sneezing, nasal congestion or excess/ purulent secretions, fever, chills, sweats,  unintended wt loss, pleuritic or exertional cp, hempoptysis, orthopnea pnd or change in chronic leg swelling. Also denies presyncope, palpitations, heartburn, abdominal pain, nausea, vomiting, diarrhea or change in bowel or urinary habits, dysuria,hematuria, rash, arthralgias, visual complaints, headache, numbness weakness or ataxia.     Objective:   Physical Exam  Speaking in full sentences No accessory muscle use Normal affect, slight anxious      Assessment & Plan:    Assessment and  Plan    Pneumonia due to COPD Persistent symptoms post-treatment for pneumonia due to COPD, including chest tightness, heaviness, and nocturnal wheezing. Recent ER visit confirmed pneumonia with a hazy area in the lower right lung on chest x-ray. Completed levofloxacin and prednisone . No fever, but new sore throat and persistent chest symptoms. WBC count slightly elevated at 13.3, suggesting possible viral infection. Current inhalers (Trelegy and albuterol ) ineffective. Discussed potential need for another prednisone  course if symptoms worsen. Informed about Mucinex and Delsym for symptom relief. - Call in prednisone  to pharmacy for potential use if symptoms worsen - Recommend Mucinex 600 mg twice daily - Recommend Delsym for nighttime cough - Refill albuterol  nebulizer - Prescribe generic albuterol  inhaler for 69-month supply  Chronic Obstructive Pulmonary Disease (COPD) exacerbation ?viral COPD managed with Trelegy once daily. Reports albuterol  inhaler provided by insurance is ineffective. Nebulizer medication expired and caused fatigue. Discussed insurance limitations on inhaler prescriptions and need for a 81-month supply for cost-effectiveness. - Refill Trelegy - Refill albuterol  nebulizer - Prescribe generic albuterol  inhaler for 88-month supply  Smoking Cessation Continues to smoke despite multiple quit attempts. Acknowledges difficulty and health impact, including lung nodules and history of lung cancer screening. Discussed potential for hypnotism or other cessation aids in future visits. - Discuss potential for hypnotism or other smoking cessation aids in future visits  General Health Maintenance Up to date with RSV, COVID, flu, shingles, and pneumonia vaccinations. - Continue annual lung cancer screening  Follow-up - Schedule follow-up appointment in 4-6 months.      Total encounter time was 31 minutes

## 2023-10-21 NOTE — Patient Instructions (Addendum)
 X Refills on albuterol nebs & MDI x 3 months with 3 refills  X pred taper to take as needed

## 2023-10-22 ENCOUNTER — Encounter (HOSPITAL_BASED_OUTPATIENT_CLINIC_OR_DEPARTMENT_OTHER): Payer: Self-pay | Admitting: Pulmonary Disease

## 2023-10-22 MED ORDER — PROAIR HFA 108 (90 BASE) MCG/ACT IN AERS: 2.0000 | INHALATION_SPRAY | Freq: Four times a day (QID) | RESPIRATORY_TRACT | 0 refills | Status: DC | PRN

## 2023-10-29 ENCOUNTER — Encounter (HOSPITAL_BASED_OUTPATIENT_CLINIC_OR_DEPARTMENT_OTHER): Payer: Self-pay | Admitting: Pulmonary Disease

## 2023-10-30 ENCOUNTER — Encounter: Payer: Self-pay | Admitting: Internal Medicine

## 2023-10-30 ENCOUNTER — Telehealth: Payer: Medicare Other | Admitting: Internal Medicine

## 2023-10-30 ENCOUNTER — Encounter: Payer: Self-pay | Admitting: *Deleted

## 2023-10-30 VITALS — Ht 61.0 in | Wt 165.0 lb

## 2023-10-30 DIAGNOSIS — R140 Abdominal distension (gaseous): Secondary | ICD-10-CM

## 2023-10-30 DIAGNOSIS — K219 Gastro-esophageal reflux disease without esophagitis: Secondary | ICD-10-CM

## 2023-10-30 DIAGNOSIS — R195 Other fecal abnormalities: Secondary | ICD-10-CM | POA: Diagnosis not present

## 2023-10-30 DIAGNOSIS — K259 Gastric ulcer, unspecified as acute or chronic, without hemorrhage or perforation: Secondary | ICD-10-CM

## 2023-10-30 DIAGNOSIS — K315 Obstruction of duodenum: Secondary | ICD-10-CM

## 2023-10-30 DIAGNOSIS — K5909 Other constipation: Secondary | ICD-10-CM | POA: Diagnosis not present

## 2023-10-30 DIAGNOSIS — G8929 Other chronic pain: Secondary | ICD-10-CM

## 2023-10-30 DIAGNOSIS — K582 Mixed irritable bowel syndrome: Secondary | ICD-10-CM

## 2023-10-30 DIAGNOSIS — R1013 Epigastric pain: Secondary | ICD-10-CM

## 2023-10-30 DIAGNOSIS — Z791 Long term (current) use of non-steroidal anti-inflammatories (NSAID): Secondary | ICD-10-CM

## 2023-10-30 NOTE — Telephone Encounter (Signed)
 Noted, I spoke with the patient over phone. She will have a My Chart visit with Dr. Mordechai April today.

## 2023-10-30 NOTE — Progress Notes (Signed)
 Primary Care Physician:  Veda Gerald, MD   Patient Location: Home   Provider Location: [facility] office   Reason for Visit: Follow-up after upper endoscopy, abdominal pain   Persons present on the virtual encounter, with roles:    Total time (minutes) spent on medical discussion: >21 minutes   Visit was conducted using virtual method.  Visit was requested by patient.  Virtual Visit via MyChart Video Note I connected with Jillian [name] on 10/30/23 at 10:30 AM EST by video and verified that I am speaking with the correct person using two identifiers.   I discussed the limitations, risks, security and privacy concerns of performing an evaluation and management service by video and the availability of in person appointments. I also discussed with the patient that there may be a patient responsible charge related to this service. The patient expressed understanding and agreed to proceed.  Chief Complaint  Patient presents with   Follow-up    Patient being seen today via My Chart due to the need for a post procedure visit. Patient had a Egd done on 08/12/2023. Patient says she does still have issues with Tonna Frederic waking her up and vomiting at times. She says she burps up chunks of food. She is taking Omeprazole  40 mg bid also takes famotidine  20 mg at bedtime.    mucus in stools    Patient is has seen some mucus in stools several times. No issues with constipation. She is having some fecal urgency.      History of Present Illness:  Patient is a very pleasant 58 year old female who presents for virtual visit today for follow-up visit.  On treatment for recent diagnosis of pneumonia so wished to change her office visit to virtual today.  Initially seen 07/24/2023 as a new patient for abdominal pain.  Significant epigastric pain, constant, as well as poor appetite.  Prior workup:  Mesenteric artery duplex ultrasound 07/04/2023 which noted stenosis of the celiac artery and SMA.   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  SMA and IMA patent.  Follows with vascular surgery.  EGD 08/12/2023 with significant gastritis, superficial ulcerations, one 8 mm cratered gastric ulcer, duodenal stricture dilated with endoscope.  Biopsies negative for H. pylori.  Placed on omeprazole  twice daily as well as Carafate  as needed.  History of NSAID use with Goody powders 4-5 times a day, states she has decreased this immensely, taking 1 Goody powder every few days for diffuse bodyaches, fibromyalgia.  Today, overall states she feels much better.  Abdominal pain improved.  Abdominal bloating resolved.  Continues to have occasional breakthrough reflux symptoms primarily at night.  Has decreased the amount she eats at night which has helped.  Also avoiding certain food triggers.  Past Medical History:  Diagnosis Date   Anxiety    Arthritis    Asthma    COPD (chronic obstructive pulmonary disease) (HCC)    Depression    Diabetes mellitus without complication (HCC)    High blood pressure    History of bladder problems    Migraines    Sleep apnea      Past Surgical History:  Procedure Laterality Date   APPENDECTOMY     BIOPSY  08/12/2023   Procedure: BIOPSY;  Surgeon: Vinetta Greening, DO;  Location: [facility];  Service: Endoscopy;;   BLADDER SURGERY  x3   COLONOSCOPY  06/2018   Dr. Feliberto Hopping: Normal terminal ileum with normal biopsies.  Evidence of prior end to side ileocolonic anastomosis in the cecum.  This was patent and was characterized by healthy-appearing mucosa.  Multiple small and large mouth diverticula noted in the sigmoid colon and descending colon.  Random colon biopsies were benign.  Recommended colonoscopy in 10 years.   ESOPHAGOGASTRODUODENOSCOPY (EGD) WITH PROPOFOL  N/A 08/12/2023   Procedure: ESOPHAGOGASTRODUODENOSCOPY (EGD) WITH  PROPOFOL ;  Surgeon: Vinetta Greening, DO;  Location: [facility];  Service: Endoscopy;  Laterality: N/A;  245pm, asa 3   ileum resection  1982   LAPAROSCOPIC CHOLECYSTECTOMY     PARTIAL HYSTERECTOMY       Current Meds  Medication Sig   albuterol  (VENTOLIN  HFA) 108 (90 Base) MCG/ACT inhaler Inhale 2 puffs into the lungs every 6 (six) hours as needed for wheezing or shortness of breath.   Aspirin-Acetaminophen -Caffeine (GOODY HEADACHE PO) Take 1 tablet by mouth as needed.   DULoxetine  (CYMBALTA ) 60 MG capsule Take 60 mg by mouth 2 (two) times daily.    famotidine  (PEPCID ) 20 MG tablet Take 20 mg by mouth daily.   fluticasone  (FLONASE ) 50 MCG/ACT nasal spray Place 1 spray into both nostrils daily.   Fluticasone -Umeclidin-Vilant (TRELEGY ELLIPTA ) 100-62.5-25 MCG/ACT AEPB Inhale 1 puff into the lungs daily in the afternoon.   Magnesium 200 MG TABS Take 200 mg by mouth daily at 12 noon.   metFORMIN  (GLUCOPHAGE ) 500 MG tablet Take 500 mg by mouth 2 (two) times daily with a meal.   olmesartan  (BENICAR ) 20 MG tablet Take 20 mg by mouth daily.   omeprazole  (PRILOSEC) 40 MG capsule Take 1 capsule (40 mg total) by mouth 2 (two) times daily before a meal.   potassium chloride (KLOR-CON) 10 MEQ tablet Take 1 tablet by mouth daily.   predniSONE  (DELTASONE ) 10 MG tablet Take 4 tabs  daily with food x 4 days, then 3 tabs daily x 4 days, then 2 tabs daily x 4 days, then 1 tab daily x4 days then stop. #40   PROAIR  HFA 108 (90 Base) MCG/ACT inhaler Inhale 2 puffs into the lungs every 6 (six) hours as needed for wheezing or shortness of breath.   rosuvastatin  (CRESTOR ) 20 MG tablet Take 20 mg by mouth daily.   sucralfate  (CARAFATE ) 1 g tablet Take 1 tablet (1 g total) by mouth 4 (four) times daily.   traZODone  (DESYREL ) 50 MG tablet Take 50 mg by mouth at bedtime.   vitamin B-12 (CYANOCOBALAMIN) 1000 MCG tablet Take 1,000 mcg by mouth daily.     Family History  Problem Relation Age of Onset   Diabetes  Mother    COPD Mother    Heart attack Father    Diabetes Sister    Colon cancer Neg Hx    Celiac disease Neg Hx    Inflammatory bowel disease Neg Hx     Social History   Socioeconomic History   Marital status: Divorced    Spouse name: Not on file   Number of children: 1   Years of education: Not on file   Highest education level: High school graduate  Occupational History   Occupation: Comptroller: [employer]    Comment: currently on short term disability  Tobacco Use   Smoking status: Every Day    Current packs/day: 1.50    Average packs/day: 1.5 packs/day for 43.0 years (64.5 ttl pk-yrs)    Types: Cigarettes  Smokeless tobacco: Never   Tobacco comments:    Smokes a pack and a half a day [facility] 02/05/2022  Vaping Use   Vaping status: Never Used  Substance and Sexual Activity   Alcohol use: Yes    Comment: hardly ever   Drug use: No   Sexual activity: Not on file  Other Topics Concern   Not on file  Social History Narrative   Not on file   Social Drivers of Health   Financial Resource Strain: Low Risk  (08/13/2022)   Received from [facility]   Overall Financial Resource Strain (CARDIA)    Difficulty of Paying Living Expenses: Not very hard  Food Insecurity: No Food Insecurity (08/13/2022)   [facility] from [facility], [facility]   Hunger Vital Sign    Worried About Running Out of Food in the Last Year: Never true    Ran Out of Food in the Last Year: Never true  Transportation Needs: No Transportation Needs (08/13/2022)   [facility] from [facility]   [facility] - Transportation    Lack of Transportation (Medical): No    Lack of Transportation (Non-Medical): No  Physical Activity: Not on file  Stress: Not on file  Social Connections: Not on file       Review of Systems: Gen: Denies fever, chills, anorexia. Denies fatigue, weakness, weight loss.  CV: Denies chest pain, palpitations, syncope,  peripheral edema, and claudication. Resp: Denies dyspnea at rest, cough, wheezing, coughing up blood, and pleurisy. GI: see HPI Derm: Denies rash, itching, dry skin Psych: Denies depression, anxiety, memory loss, confusion. No homicidal or suicidal ideation.  Heme: Denies bruising, bleeding, and enlarged lymph nodes.  Observations/Objective: No distress. Unable to perform physical exam due to video encounter.   Assessment and Plan:  1. Epigastric pain, abdominal bloating-chronic in the setting of NSAID induced gastric ulcer, duodenal stricture, ?MALS.   Improved, with occasional breakthrough symptoms. Continue on 40 mg BID. Continue to avoid NSAIDs. Carafate  as need for breakthrough symptoms.   Diet recommendations:  4-6 small meals daily Low fat diet Low fiber diet (avoid raw fruits and vegetables).  Will schedule for EGD to evaluate healing of gastric ulcer as well as for possible dilation of duodenal stricture.   The risks including infection, bleed, or perforation as well as benefits, limitations, alternatives and imponderables have been reviewed with the patient. Potential for esophageal dilation, biopsy, etc. have also been reviewed.  Questions have been answered. All parties agreeable.  Continue follow-up with vascular surgery clinic.  2.  Chronic constipation with overflow diarrhea, has tried MiraLAX.  States she does not feel as though she is constipated anymore.  Continue to monitor.  Does note some mucus in her stool.  May need to update her colonoscopy though we will hold off for now.  Follow-up after upper endoscopy  Follow Up Instructions: I discussed the assessment and treatment plan with the patient. The patient was provided an opportunity to ask questions and all were answered. The patient agreed with the plan and demonstrated an understanding of the instructions.   The patient was advised to call back or seek an in-person evaluation if the symptoms worsen or if the  condition fails to improve as anticipated.  I provided >21 minutes of face-to-face time during this MyChart Video encounter.  Rolando Cliche. Karleen Seebeck D.O. [facility]

## 2023-10-30 NOTE — Patient Instructions (Signed)
 We will schedule you for upper endoscopy to to evaluate healing of your ulcerations in your stomach.  I may elect to dilate the stricture in your small bowel as well.  Continue on omeprazole  twice daily.  You can take Carafate  on top of this as needed.  Continue to limit Goody powder use.  It was very nice talking with you today.  Dr. Mordechai April

## 2023-11-12 ENCOUNTER — Ambulatory Visit: Payer: Self-pay

## 2023-11-12 NOTE — Telephone Encounter (Signed)
Chief Complaint: SOB Symptoms: Moderate SOB, cough, runny nose Frequency: Comes and goes Pertinent Negatives: Patient denies chest pain, fever, nausea vomiting Disposition: [] ED /[] Urgent Care (no appt availability in office) / [] Appointment(In office/virtual)/ []  [facility]/ [] Home Care/ [x] Refused Recommended Disposition /[] [facility]/ []  Follow-up with PCP Additional Notes: Patient states she was dx with pneumonia on 10/16/23 and stated she has a hard time taking a good deep breath now. Patient reports Hx of COPD. Patient stated she wants to make sure the pneumonia is gone. Care advice was given and patient stated she wants to establish care with a new PCP. Appointment has been scheduled for first available but advised patient to seek care at urgent care for symptoms. Patient stated she does not want to go today but if her symptoms gets worse she will go. Advised to callback if symptoms get worse. Patient verbalized understanding.    Copied from CRM [id number]. 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 RESPIRATORY STATUS: "Describe your breathing?" (e.g., wheezing, shortness of breath, unable to speak, severe coughing)      SOB 2. ONSET: "When did this breathing problem begin?"      Jan.1 2025  3. PATTERN "Does the difficult breathing come and go, or has it been constant since it started?"      Come and go 4. SEVERITY: "How bad is your breathing?" (e.g., mild, moderate, severe)    - MILD: No SOB at rest, mild  SOB with walking, speaks normally in sentences, can lie down, no retractions, pulse < 100.    - MODERATE: SOB at rest, SOB with minimal exertion and prefers to sit, cannot lie down flat, speaks in phrases, mild retractions, audible wheezing, pulse 100-120.    - SEVERE: Very SOB at rest, speaks in single words, struggling to breathe, sitting hunched forward, retractions, pulse > 120      Moderate  5. RECURRENT SYMPTOM: "Have you had difficulty breathing before?" If Yes, ask: "When was the last time?" and "What happened that time?"      Yes, I have COPD  6. CARDIAC HISTORY: "Do you have any history of heart disease?" (e.g., heart attack, angina, bypass surgery, angioplasty)      N/A 7. LUNG HISTORY: "Do you have any history of lung disease?"  (e.g., pulmonary embolus, asthma, emphysema)     COPD stage 4  8. CAUSE: "What do you think is causing the breathing problem?"      Symptoms from COPD and recent pneumonia  9. OTHER SYMPTOMS: "Do you have any other symptoms? (e.g., dizziness, runny nose, cough, chest pain, fever)     Cough, runny nose  Protocols used: Breathing Difficulty-A-AH

## 2023-11-13 ENCOUNTER — Encounter: Payer: Self-pay | Admitting: *Deleted

## 2023-11-14 ENCOUNTER — Encounter (HOSPITAL_COMMUNITY): Payer: Medicare Other

## 2023-11-29 ENCOUNTER — Encounter (HOSPITAL_BASED_OUTPATIENT_CLINIC_OR_DEPARTMENT_OTHER): Payer: Self-pay | Admitting: Pulmonary Disease

## 2023-11-29 MED ORDER — AZITHROMYCIN 250 MG PO TABS: 250.0000 mg | ORAL_TABLET | Freq: Every day | ORAL | 0 refills | Status: DC

## 2023-11-29 MED ORDER — PREDNISONE 10 MG PO TABS: ORAL_TABLET | ORAL | 0 refills | Status: DC

## 2023-11-29 NOTE — Telephone Encounter (Signed)
Please advise

## 2023-12-02 ENCOUNTER — Encounter: Payer: Self-pay | Admitting: *Deleted

## 2023-12-02 ENCOUNTER — Encounter (HOSPITAL_COMMUNITY): Payer: Medicare Other

## 2023-12-02 ENCOUNTER — Telehealth: Payer: Self-pay | Admitting: *Deleted

## 2023-12-02 NOTE — Telephone Encounter (Signed)
Elsie Amis, RN  Lyda Colcord S, CMA; Marlowe Shores, LPN; Elinor Dodge, LPN; Carloyn Jaeger H Well.. I just got off the phone with Smith Mince. She needs to reschedule her procedure due to another death in her family. She die want to reschedule ASAP. A+She said she would call the office after 0900 to let you know.

## 2023-12-02 NOTE — Telephone Encounter (Signed)
Pt has been rescheduled to 12/23/23. Updated instructions sent via mychart.

## 2023-12-04 ENCOUNTER — Ambulatory Visit: Payer: Medicare Other | Admitting: Internal Medicine

## 2023-12-19 ENCOUNTER — Encounter (HOSPITAL_COMMUNITY): Payer: Self-pay

## 2023-12-19 ENCOUNTER — Encounter (HOSPITAL_COMMUNITY)
Admission: RE | Admit: 2023-12-19 | Discharge: 2023-12-19 | Disposition: A | Discharge status: Home / Self Care | Payer: Medicare Other | Source: Ambulatory Visit | Attending: Internal Medicine | Admitting: Internal Medicine

## 2023-12-23 ENCOUNTER — Ambulatory Visit: Payer: Self-pay | Admitting: Internal Medicine

## 2023-12-23 ENCOUNTER — Encounter: Payer: Self-pay | Admitting: *Deleted

## 2023-12-23 NOTE — Telephone Encounter (Signed)
 Pt called office and has been rescheduled for 01/13/24. Updated instructions sent via mychart

## 2024-01-03 ENCOUNTER — Other Ambulatory Visit (HOSPITAL_COMMUNITY): Payer: Self-pay

## 2024-01-03 ENCOUNTER — Telehealth: Payer: Self-pay | Admitting: Pharmacy Technician

## 2024-01-03 ENCOUNTER — Encounter: Payer: Self-pay | Admitting: Internal Medicine

## 2024-01-03 ENCOUNTER — Ambulatory Visit: Admitting: Internal Medicine

## 2024-01-03 VITALS — BP 130/60 | HR 88 | Ht 61.0 in | Wt 170.8 lb

## 2024-01-03 DIAGNOSIS — E1169 Type 2 diabetes mellitus with other specified complication: Secondary | ICD-10-CM | POA: Diagnosis not present

## 2024-01-03 DIAGNOSIS — M797 Fibromyalgia: Secondary | ICD-10-CM | POA: Diagnosis not present

## 2024-01-03 DIAGNOSIS — E782 Mixed hyperlipidemia: Secondary | ICD-10-CM | POA: Diagnosis not present

## 2024-01-03 DIAGNOSIS — I10 Essential (primary) hypertension: Secondary | ICD-10-CM

## 2024-01-03 DIAGNOSIS — J449 Chronic obstructive pulmonary disease, unspecified: Secondary | ICD-10-CM | POA: Diagnosis not present

## 2024-01-03 DIAGNOSIS — Z1231 Encounter for screening mammogram for malignant neoplasm of breast: Secondary | ICD-10-CM

## 2024-01-03 DIAGNOSIS — Z720 Tobacco use: Secondary | ICD-10-CM

## 2024-01-03 DIAGNOSIS — Z1159 Encounter for screening for other viral diseases: Secondary | ICD-10-CM

## 2024-01-03 DIAGNOSIS — G4733 Obstructive sleep apnea (adult) (pediatric): Secondary | ICD-10-CM

## 2024-01-03 DIAGNOSIS — Z7984 Long term (current) use of oral hypoglycemic drugs: Secondary | ICD-10-CM

## 2024-01-03 DIAGNOSIS — F17210 Nicotine dependence, cigarettes, uncomplicated: Secondary | ICD-10-CM

## 2024-01-03 DIAGNOSIS — Z114 Encounter for screening for human immunodeficiency virus [HIV]: Secondary | ICD-10-CM

## 2024-01-03 MED ORDER — AMITRIPTYLINE HCL 10 MG PO TABS: 10.0000 mg | ORAL_TABLET | Freq: Every day | ORAL | 2 refills | Status: DC

## 2024-01-03 MED ORDER — METFORMIN HCL 500 MG PO TABS: 500.0000 mg | ORAL_TABLET | Freq: Two times a day (BID) | ORAL | 1 refills | Status: DC

## 2024-01-03 MED ORDER — ROSUVASTATIN CALCIUM 20 MG PO TABS: 20.0000 mg | ORAL_TABLET | Freq: Every day | ORAL | 1 refills | Status: DC

## 2024-01-03 MED ORDER — OLMESARTAN MEDOXOMIL 20 MG PO TABS: 20.0000 mg | ORAL_TABLET | Freq: Every day | ORAL | 1 refills | Status: DC

## 2024-01-03 MED ORDER — ALBUTEROL SULFATE (2.5 MG/3ML) 0.083% IN NEBU: 2.5000 mg | INHALATION_SOLUTION | Freq: Four times a day (QID) | RESPIRATORY_TRACT | 3 refills | Status: DC | PRN

## 2024-01-03 NOTE — Assessment & Plan Note (Signed)
 Uncontrolled, likely due to poor sleep quality Currently takes Cymbalta 60 mg twice daily, advised to take Cymbalta 60 mg QD for now Added Elavil 10 mg at bedtime, can increase dose if tolerated DC trazodone as it is not improving insomnia

## 2024-01-03 NOTE — Assessment & Plan Note (Signed)
 Daytime fatigue and poor sleep quality could be related to OSA as well Needs to contact DME for alternative mask Followed by pulmonology

## 2024-01-03 NOTE — Progress Notes (Signed)
 New Patient Office Visit  Subjective:  Patient ID: Jillian [name], female    DOB: September 28, 1966  Age: 58 y.o. MRN: [id number]  CC:  Chief Complaint  Patient presents with   Establish Care    New patient establishing care    HPI Jillian [name] is a 58 y.o. female with past medical history of HTN, COPD, OSA, type II DM, GERD and tobacco abuse who presents for establishing care.  HTN: Her BP is well controlled currently.  She takes olmesartan 20 mg QD currently.  Denies any headache, dizziness or chest pain currently.  Type II DM: Her HbA1c has been up to 7.0 in the past.  She takes metformin 500 mg twice daily.  Chart review suggests that her HbA1c has been around 6.3 since 2023.  She denies polyuria or polyphagia.  Has chronic fatigue.  OSA: She has chronic fatigue and insomnia.  She has CPAP, but does not tolerate the mask.  Followed by Dr. Vassie Loll currently.  She takes trazodone as needed for insomnia.  COPD: She uses Trelegy regularly and albuterol inhaler as needed for dyspnea or wheezing.  She had responded well to ProAir, but had to use Ventolin recently due to insurance formulary, but does not get adequate relief with Ventolin.  She requests refill of albuterol nebulizer.  Denies any recent worsening of dyspnea or wheezing currently.  She still smokes 2 pack/day, but reports that she lights it, but does not smoke whole cigarette.  Fibromyalgia: She takes Cymbalta 60 mg twice daily currently.  She has diffuse muscle aches, around neck, low back, upper and lower extremities.  She has poor sleep quality, has difficulty maintaining sleep.  Denies anhedonia, SI or HI.  GERD: She takes omeprazole 40 mg twice daily, followed by GI.  Denies nausea, vomiting, dysphagia or odynophagia currently.   Past Medical History:  Diagnosis Date   Anxiety    Arthritis    Asthma    COPD (chronic obstructive pulmonary disease) (HCC)    Depression    Diabetes mellitus without complication (HCC)    High  blood pressure    History of bladder problems    Migraines    Sleep apnea     Past Surgical History:  Procedure Laterality Date   APPENDECTOMY     BIOPSY  08/12/2023   Procedure: BIOPSY;  Surgeon: Lanelle Bal, DO;  Location: [facility];  Service: Endoscopy;;   BLADDER SURGERY     x3   COLONOSCOPY  06/2018   Dr. Marca Ancona: Normal terminal ileum with normal biopsies.  Evidence of prior end to side ileocolonic anastomosis in the cecum.  This was patent and was characterized by healthy-appearing mucosa.  Multiple small and large mouth diverticula noted in the sigmoid colon and descending colon.  Random colon biopsies were benign.  Recommended colonoscopy in 10 years.   ESOPHAGOGASTRODUODENOSCOPY (EGD) WITH PROPOFOL N/A 08/12/2023   Procedure: ESOPHAGOGASTRODUODENOSCOPY (EGD) WITH PROPOFOL;  Surgeon: Lanelle Bal, DO;  Location: [facility];  Service: Endoscopy;  Laterality: N/A;  245pm, asa 3   ileum resection  1982   LAPAROSCOPIC CHOLECYSTECTOMY     PARTIAL HYSTERECTOMY      Family History  Problem Relation Age of Onset   Diabetes Mother    COPD Mother    Heart attack Father    Diabetes Sister    Colon cancer Neg Hx    Celiac disease Neg Hx    Inflammatory bowel disease Neg Hx  Social History   Socioeconomic History   Marital status: Divorced    Spouse name: Not on file   Number of children: 1   Years of education: Not on file   Highest education level: Some college, no degree  Occupational History   Occupation: Comptroller: [name]    Comment: currently on short term disability  Tobacco Use   Smoking status: Every Day    Current packs/day: 1.50    Average packs/day: 1.5 packs/day for 43.0 years (64.5 ttl pk-yrs)    Types: Cigarettes   Smokeless tobacco: Never   Tobacco comments:    2 ppd as of 01/03/24  Vaping Use   Vaping status: Never Used  Substance and Sexual Activity   Alcohol use: Yes    Comment: hardly ever   Drug use:  No   Sexual activity: Not on file  Other Topics Concern   Not on file  Social History Narrative   Not on file   Social Drivers of Health   Financial Resource Strain: Medium Risk (12/30/2023)   Overall Financial Resource Strain (CARDIA)    Difficulty of Paying Living Expenses: Somewhat hard  Food Insecurity: Food Insecurity Present (12/30/2023)   Hunger Vital Sign    Worried About Running Out of Food in the Last Year: Patient declined    Ran Out of Food in the Last Year: Sometimes true  Transportation Needs: No Transportation Needs (12/30/2023)   PRAPARE - Administrator, Civil Service (Medical): No    Lack of Transportation (Non-Medical): No  Physical Activity: Unknown (12/30/2023)   Exercise Vital Sign    Days of Exercise per Week: 0 days    Minutes of Exercise per Session: Not on file  Stress: Stress Concern Present (12/30/2023)   [employer] of Occupational Health - Occupational Stress Questionnaire    Feeling of Stress : Very much  Social Connections: Socially Isolated (12/30/2023)   Social Connection and Isolation Panel [NHANES]    Frequency of Communication with Friends and Family: Twice a week    Frequency of Social Gatherings with Friends and Family: Never    Attends Religious Services: Never    Database administrator or Organizations: No    Attends Engineer, structural: Not on file    Marital Status: Divorced  Intimate Partner Violence: Not on file    ROS Review of Systems  Constitutional:  Negative for chills and fever.  HENT:  Negative for congestion, sinus pressure, sinus pain and sore throat.   Eyes:  Negative for pain and discharge.  Respiratory:  Positive for shortness of breath (Intermittent). Negative for cough.   Cardiovascular:  Negative for chest pain and palpitations.  Gastrointestinal:  Negative for abdominal pain, diarrhea, nausea and vomiting.  Endocrine: Negative for polydipsia and polyuria.  Genitourinary:  Negative for  dysuria and hematuria.  Musculoskeletal:  Positive for back pain and myalgias. Negative for neck pain and neck stiffness.  Skin:  Negative for rash.  Neurological:  Negative for dizziness and weakness.  Psychiatric/Behavioral:  Negative for agitation and behavioral problems.     Objective:   Today's Vitals: BP 130/60   Pulse 88   Ht 5\' 1"  (1.549 m)   Wt 170 lb 12.8 oz (77.5 kg)   SpO2 90%   BMI 32.27 kg/m   Physical Exam Vitals reviewed.  Constitutional:      General: She is not in acute distress.    Appearance: She is not  diaphoretic.  HENT:     Head: Normocephalic and atraumatic.     Nose: Nose normal.     Mouth/Throat:     Mouth: Mucous membranes are moist.  Eyes:     General: No scleral icterus.    Extraocular Movements: Extraocular movements intact.  Cardiovascular:     Rate and Rhythm: Normal rate and regular rhythm.     Heart sounds: Normal heart sounds. No murmur heard. Pulmonary:     Breath sounds: Normal breath sounds. No wheezing or rales.  Musculoskeletal:     Cervical back: Neck supple. No tenderness.     Right lower leg: No edema.     Left lower leg: No edema.  Skin:    General: Skin is warm.     Findings: No rash.  Neurological:     General: No focal deficit present.     Mental Status: She is alert and oriented to person, place, and time.  Psychiatric:        Mood and Affect: Mood normal.        Behavior: Behavior normal.     Assessment & Plan:   Problem List Items Addressed This Visit       Cardiovascular and Mediastinum   Essential hypertension   BP Readings from Last 1 Encounters:  01/03/24 130/60   Well-controlled with olmesartan 20 mg QD Counseled for compliance with the medications Advised DASH diet and moderate exercise/walking, at least 150 mins/week       Relevant Medications   olmesartan (BENICAR) 20 MG tablet   rosuvastatin (CRESTOR) 20 MG tablet   Other Relevant Orders   CBC with Differential/Platelet   CMP14+EGFR    TSH     Respiratory   OSA (obstructive sleep apnea)   Daytime fatigue and poor sleep quality could be related to OSA as well Needs to contact DME for alternative mask Followed by pulmonology      COPD (chronic obstructive pulmonary disease) (HCC)   Overall well controlled with Trelegy and as needed albuterol Refilled albuterol neb Needs to cut down -> quit smoking Followed by pulmonology      Relevant Medications   albuterol (PROVENTIL) (2.5 MG/3ML) 0.083% nebulizer solution     Endocrine   Type 2 diabetes mellitus with other specified complication (HCC) - Primary   Well-controlled now HbA1c has been up to 7 in the past On Metformin 500 mg twice daily Advised to follow diabetic diet On ARB and statin F/u CMP and lipid panel Diabetic eye exam: Advised to follow up with Ophthalmology for diabetic eye exam      Relevant Medications   olmesartan (BENICAR) 20 MG tablet   rosuvastatin (CRESTOR) 20 MG tablet   metFORMIN (GLUCOPHAGE) 500 MG tablet   Other Relevant Orders   CMP14+EGFR   Hemoglobin A1c   Urine Microalbumin w/creat. ratio     Other   Tobacco abuse   Smokes about 2 pack/day  Asked about quitting: confirms that he/she currently smokes cigarettes Advise to quit smoking: Educated about QUITTING to reduce the risk of cancer, cardio and cerebrovascular disease. Assess willingness: Unwilling to quit at this time, but is working on cutting back. Assist with counseling and pharmacotherapy: Counseled for 5 minutes. Arrange for follow up: follow up in 3 months and continue to offer help.      Mixed hyperlipidemia   Continue Crestor 20 mg once daily Check lipid profile      Relevant Medications   olmesartan (BENICAR) 20 MG tablet   rosuvastatin (  CRESTOR) 20 MG tablet   Other Relevant Orders   Lipid Profile   Fibromyalgia   Uncontrolled, likely due to poor sleep quality Currently takes Cymbalta 60 mg twice daily, advised to take Cymbalta 60 mg QD for now Added  Elavil 10 mg at bedtime, can increase dose if tolerated DC trazodone as it is not improving insomnia      Relevant Medications   amitriptyline (ELAVIL) 10 MG tablet   Other Visit Diagnoses       Need for hepatitis C screening test       Relevant Orders   Hepatitis C Antibody     Screening for HIV (human immunodeficiency virus)       Relevant Orders   HIV antibody (with reflex)     Breast cancer screening by mammogram       Relevant Orders   MM 3D SCREENING MAMMOGRAM BILATERAL BREAST       Outpatient Encounter Medications as of 01/03/2024  Medication Sig   albuterol (VENTOLIN HFA) 108 (90 Base) MCG/ACT inhaler Inhale 2 puffs into the lungs every 6 (six) hours as needed for wheezing or shortness of breath.   amitriptyline (ELAVIL) 10 MG tablet Take 1 tablet (10 mg total) by mouth at bedtime.   Aspirin-Acetaminophen-Caffeine (GOODY HEADACHE PO) Take 1 tablet by mouth as needed.   DULoxetine (CYMBALTA) 60 MG capsule Take 60 mg by mouth 2 (two) times daily.    famotidine (PEPCID) 20 MG tablet Take 20 mg by mouth daily.   fluticasone (FLONASE) 50 MCG/ACT nasal spray Place 1 spray into both nostrils daily.   Fluticasone-Umeclidin-Vilant (TRELEGY ELLIPTA) 100-62.5-25 MCG/ACT AEPB Inhale 1 puff into the lungs daily in the afternoon.   Magnesium 200 MG TABS Take 200 mg by mouth daily at 12 noon.   omeprazole (PRILOSEC) 40 MG capsule Take 1 capsule (40 mg total) by mouth 2 (two) times daily before a meal.   potassium chloride (KLOR-CON) 10 MEQ tablet Take 1 tablet by mouth daily.   vitamin B-12 (CYANOCOBALAMIN) 1000 MCG tablet Take 1,000 mcg by mouth daily.   [DISCONTINUED] metFORMIN (GLUCOPHAGE) 500 MG tablet Take 500 mg by mouth 2 (two) times daily with a meal.   [DISCONTINUED] olmesartan (BENICAR) 20 MG tablet Take 20 mg by mouth daily.   [DISCONTINUED] rosuvastatin (CRESTOR) 20 MG tablet Take 20 mg by mouth daily.   [DISCONTINUED] traZODone (DESYREL) 50 MG tablet Take 50 mg by mouth at  bedtime.   albuterol (PROVENTIL) (2.5 MG/3ML) 0.083% nebulizer solution Take 3 mLs (2.5 mg total) by nebulization every 6 (six) hours as needed for wheezing or shortness of breath.   metFORMIN (GLUCOPHAGE) 500 MG tablet Take 1 tablet (500 mg total) by mouth 2 (two) times daily with a meal.   olmesartan (BENICAR) 20 MG tablet Take 1 tablet (20 mg total) by mouth daily.   rosuvastatin (CRESTOR) 20 MG tablet Take 1 tablet (20 mg total) by mouth daily.   sucralfate (CARAFATE) 1 g tablet Take 1 tablet (1 g total) by mouth 4 (four) times daily.   [DISCONTINUED] albuterol (PROVENTIL) (2.5 MG/3ML) 0.083% nebulizer solution Take 3 mLs (2.5 mg total) by nebulization every 6 (six) hours as needed for wheezing or shortness of breath. (Patient not taking: Reported on 01/03/2024)   [DISCONTINUED] azithromycin (ZITHROMAX) 250 MG tablet Take 1 tablet (250 mg total) by mouth daily.   [DISCONTINUED] predniSONE (DELTASONE) 10 MG tablet Take 4 tabs  daily with food x 4 days, then 3 tabs daily x 4 days, then  2 tabs daily x 4 days, then 1 tab daily x4 days then stop. #40   [DISCONTINUED] PROAIR HFA 108 (90 Base) MCG/ACT inhaler Inhale 2 puffs into the lungs every 6 (six) hours as needed for wheezing or shortness of breath.   No facility-administered encounter medications on file as of 01/03/2024.    Follow-up: Return in about 3 months (around 04/04/2024) for DM and fibromyalgia.   Anabel Halon, MD

## 2024-01-03 NOTE — Assessment & Plan Note (Signed)
 Overall well controlled with Trelegy and as needed albuterol Refilled albuterol neb Needs to cut down -> quit smoking Followed by pulmonology

## 2024-01-03 NOTE — Assessment & Plan Note (Signed)
 BP Readings from Last 1 Encounters:  01/03/24 130/60   Well-controlled with olmesartan 20 mg QD Counseled for compliance with the medications Advised DASH diet and moderate exercise/walking, at least 150 mins/week

## 2024-01-03 NOTE — Assessment & Plan Note (Addendum)
 Smokes about 2 pack/day  Asked about quitting: confirms that he/she currently smokes cigarettes Advise to quit smoking: Educated about QUITTING to reduce the risk of cancer, cardio and cerebrovascular disease. Assess willingness: Unwilling to quit at this time, but is working on cutting back. Assist with counseling and pharmacotherapy: Counseled for 5 minutes. Arrange for follow up: follow up in 3 months and continue to offer help.

## 2024-01-03 NOTE — Telephone Encounter (Signed)
 Pharmacy Patient Advocate Encounter   Received notification from CoverMyMeds that prior authorization for Albuterol Sulfate (2.5 MG/3ML)0.083% nebulizer solution is required/requested.   Insurance verification completed.   The patient is insured through [employer] .   Per test claim: Covered under Medicare Part B

## 2024-01-03 NOTE — Assessment & Plan Note (Signed)
 Well-controlled now HbA1c has been up to 7 in the past On Metformin 500 mg twice daily Advised to follow diabetic diet On ARB and statin F/u CMP and lipid panel Diabetic eye exam: Advised to follow up with Ophthalmology for diabetic eye exam

## 2024-01-03 NOTE — Assessment & Plan Note (Signed)
 Continue Crestor 20 mg once daily Check lipid profile

## 2024-01-03 NOTE — Patient Instructions (Addendum)
 Please schedule pap with NP.  Please start taking Amitriptyline as prescribed at bedtime.  Please start taking Cymbalta 60 mg once daily instead of twice daily.  Please continue to take medications as prescribed.  Please continue to follow low carb diet and perform moderate exercise/walking at least 150 mins/week.

## 2024-01-06 ENCOUNTER — Other Ambulatory Visit: Payer: Self-pay

## 2024-01-06 ENCOUNTER — Ambulatory Visit: Payer: Self-pay

## 2024-01-06 LAB — CMP14+EGFR
ALT: 44 IU/L — ABNORMAL HIGH (ref 0–32)
AST: 26 IU/L (ref 0–40)
Albumin: 4.2 g/dL (ref 3.8–4.9)
Alkaline Phosphatase: 115 IU/L (ref 44–121)
BUN/Creatinine Ratio: 17 (ref 9–23)
BUN: 23 mg/dL (ref 6–24)
Bilirubin Total: <0.2 mg/dL (ref 0.0–1.2)
CO2: 22 mmol/L (ref 20–29)
Calcium: 9.2 mg/dL (ref 8.7–10.2)
Chloride: 101 mmol/L (ref 96–106)
Creatinine, Ser: 1.39 mg/dL — ABNORMAL HIGH (ref 0.57–1.00)
Globulin, Total: 2.2 g/dL (ref 1.5–4.5)
Glucose: 121 mg/dL — ABNORMAL HIGH (ref 70–99)
Potassium: 5.1 mmol/L (ref 3.5–5.2)
Sodium: 139 mmol/L (ref 134–144)
Total Protein: 6.4 g/dL (ref 6.0–8.5)
eGFR: 44 mL/min/{1.73_m2} — ABNORMAL LOW (ref >59)

## 2024-01-06 LAB — MICROALBUMIN / CREATININE URINE RATIO
Creatinine, Urine: 63.9 mg/dL
Microalb/Creat Ratio: 14 mg/g{creat} (ref 0–29)
Microalbumin, Urine: 8.7 ug/mL

## 2024-01-06 LAB — HEMOGLOBIN A1C
Est. average glucose Bld gHb Est-mCnc: 166 mg/dL
Hgb A1c MFr Bld: 7.4 % — ABNORMAL HIGH (ref 4.8–5.6)

## 2024-01-06 LAB — CBC WITH DIFFERENTIAL/PLATELET
Basophils Absolute: 0.1 10*3/uL (ref 0.0–0.2)
Basos: 1 %
EOS (ABSOLUTE): 1.2 10*3/uL — ABNORMAL HIGH (ref 0.0–0.4)
Eos: 8 %
Hematocrit: 44.6 % (ref 34.0–46.6)
Hemoglobin: 14.2 g/dL (ref 11.1–15.9)
Immature Grans (Abs): 0 10*3/uL (ref 0.0–0.1)
Immature Granulocytes: 0 %
Lymphocytes Absolute: 2.7 10*3/uL (ref 0.7–3.1)
Lymphs: 18 %
MCH: 29.6 pg (ref 26.6–33.0)
MCHC: 31.8 g/dL (ref 31.5–35.7)
MCV: 93 fL (ref 79–97)
Monocytes Absolute: 1 10*3/uL — ABNORMAL HIGH (ref 0.1–0.9)
Monocytes: 7 %
Neutrophils Absolute: 9.7 10*3/uL — ABNORMAL HIGH (ref 1.4–7.0)
Neutrophils: 66 %
Platelets: 292 10*3/uL (ref 150–450)
RBC: 4.8 x10E6/uL (ref 3.77–5.28)
RDW: 14.1 % (ref 11.7–15.4)
WBC: 14.7 10*3/uL — ABNORMAL HIGH (ref 3.4–10.8)

## 2024-01-06 LAB — TSH: TSH: 1.9 u[IU]/mL (ref 0.450–4.500)

## 2024-01-06 LAB — LIPID PANEL
Chol/HDL Ratio: 2.4 ratio (ref 0.0–4.4)
Cholesterol, Total: 92 mg/dL — ABNORMAL LOW (ref 100–199)
HDL: 39 mg/dL — ABNORMAL LOW (ref >39)
LDL Chol Calc (NIH): 21 mg/dL (ref 0–99)
Triglycerides: 204 mg/dL — ABNORMAL HIGH (ref 0–149)
VLDL Cholesterol Cal: 32 mg/dL (ref 5–40)

## 2024-01-06 LAB — HEPATITIS C ANTIBODY: Hep C Virus Ab: NONREACTIVE

## 2024-01-06 LAB — HIV ANTIBODY (ROUTINE TESTING W REFLEX): HIV Screen 4th Generation wRfx: NONREACTIVE

## 2024-01-06 NOTE — Telephone Encounter (Signed)
  Chief Complaint: medication question/side effect Symptoms: insomnia, irritability Frequency: started new medication, Elavil on 01/03/24 Pertinent Negatives: Patient denies N/A. Disposition: [] ED /[] Urgent Care (no appt availability in office) / [] Appointment(In office/virtual)/ []  [place] [facility]/ [] Home Care/ [] Refused Recommended Disposition /[] [place] [facility]/ [x]  Follow-up with PCP Additional Notes: Patient states she had some medication changes at her last  OV with Dr Allena Katz on 01/03/24. She states her trazodone was causing "daytime fogginess". She states the Elavil does not cause the fogginess but it has not helped her sleep/insomnia and she feels irritable. She would like to know if she should continue the medication or go back to taking her trazodone. She states she does not feel comfortable taking another dose of the Elavil tonight.  Copied from CRM [id number]. Topic: Clinical - Red Word Triage >> Jan 06, 2024  3:46 PM Clayton Bibles wrote: Red Word that prompted transfer to Nurse Triage: Jillian [name] was put on amitriptyline (ELAVIL) 10 MG tablet for sleeping. She has taken it twice. She feels irritated and cannot sleep. Reason for Disposition  [1] Caller has NON-URGENT medicine question about med that PCP prescribed AND [2] triager unable to answer question  Answer Assessment - Initial Assessment Questions 1. NAME of MEDICINE: "What medicine(s) are you calling about?"     Amitriptyline.  2. QUESTION: "What is your question?" (e.g., double dose of medicine, side effect)     Side effect question. She would like to know if she can stop the medication and go back to her trazodone.  3. PRESCRIBER: "Who prescribed the medicine?" Reason: if prescribed by specialist, call should be referred to that group.     Dr Allena Katz  4. SYMPTOMS: "Do you have any symptoms?" If Yes, ask: "What symptoms are you having?"  "How bad are the symptoms (e.g., mild, moderate, severe)     She states the  amitriptyline has helped with the daytime fogginess but she states it is not helping her with her insomnia. She states she has been feeling short tempered and irritated.  5. PREGNANCY:  "Is there any chance that you are pregnant?" "When was your last menstrual period?"     N/A.  Protocols used: Medication Question Call-A-AH

## 2024-01-07 ENCOUNTER — Encounter: Payer: Self-pay | Admitting: Internal Medicine

## 2024-01-09 ENCOUNTER — Encounter (HOSPITAL_COMMUNITY)
Admission: RE | Admit: 2024-01-09 | Discharge: 2024-01-09 | Disposition: A | Discharge status: Home / Self Care | Source: Ambulatory Visit | Attending: Internal Medicine | Admitting: Internal Medicine

## 2024-01-09 ENCOUNTER — Encounter (HOSPITAL_COMMUNITY): Payer: Self-pay

## 2024-01-09 ENCOUNTER — Other Ambulatory Visit: Payer: Self-pay

## 2024-01-10 ENCOUNTER — Telehealth: Payer: Self-pay | Admitting: Internal Medicine

## 2024-01-10 NOTE — Telephone Encounter (Signed)
 Spoke with patient. Discussed Elavil. Advised her it may take a couple of weeks to feel effects from it. She verbalized understanding. Stated she will continue Elavil and if no better will send a message to discuss.

## 2024-01-10 NOTE — Telephone Encounter (Signed)
 Copied from CRM [id number]. Topic: General - Other >> Jan 09, 2024  4:48 PM Emylou G wrote: Reason for CRM: spoke w/clinic.Marland Kitchen adv patient.Marland Kitchen dont see reason on the call today.. but will send message over for f/u .Marland Kitchen

## 2024-01-13 ENCOUNTER — Ambulatory Visit (HOSPITAL_COMMUNITY): Payer: Self-pay | Admitting: Anesthesiology

## 2024-01-13 ENCOUNTER — Encounter (HOSPITAL_COMMUNITY): Payer: Self-pay | Admitting: Internal Medicine

## 2024-01-13 ENCOUNTER — Encounter: Payer: Self-pay | Admitting: Internal Medicine

## 2024-01-13 ENCOUNTER — Ambulatory Visit (HOSPITAL_COMMUNITY)
Admission: RE | Admit: 2024-01-13 | Discharge: 2024-01-13 | Disposition: A | Discharge status: Home / Self Care | Payer: Medicare Other | Attending: Internal Medicine | Admitting: Internal Medicine

## 2024-01-13 ENCOUNTER — Encounter (HOSPITAL_COMMUNITY)
Admission: RE | Disposition: A | Discharge status: Home / Self Care | Payer: Self-pay | Source: Home / Self Care | Attending: Internal Medicine

## 2024-01-13 DIAGNOSIS — G4730 Sleep apnea, unspecified: Secondary | ICD-10-CM | POA: Insufficient documentation

## 2024-01-13 DIAGNOSIS — I10 Essential (primary) hypertension: Secondary | ICD-10-CM

## 2024-01-13 DIAGNOSIS — E119 Type 2 diabetes mellitus without complications: Secondary | ICD-10-CM | POA: Insufficient documentation

## 2024-01-13 DIAGNOSIS — Z7984 Long term (current) use of oral hypoglycemic drugs: Secondary | ICD-10-CM | POA: Insufficient documentation

## 2024-01-13 DIAGNOSIS — K2950 Unspecified chronic gastritis without bleeding: Secondary | ICD-10-CM | POA: Insufficient documentation

## 2024-01-13 DIAGNOSIS — M797 Fibromyalgia: Secondary | ICD-10-CM | POA: Diagnosis not present

## 2024-01-13 DIAGNOSIS — K222 Esophageal obstruction: Secondary | ICD-10-CM | POA: Diagnosis not present

## 2024-01-13 DIAGNOSIS — K259 Gastric ulcer, unspecified as acute or chronic, without hemorrhage or perforation: Secondary | ICD-10-CM | POA: Insufficient documentation

## 2024-01-13 DIAGNOSIS — G43909 Migraine, unspecified, not intractable, without status migrainosus: Secondary | ICD-10-CM | POA: Insufficient documentation

## 2024-01-13 DIAGNOSIS — J4489 Other specified chronic obstructive pulmonary disease: Secondary | ICD-10-CM | POA: Insufficient documentation

## 2024-01-13 DIAGNOSIS — F17210 Nicotine dependence, cigarettes, uncomplicated: Secondary | ICD-10-CM

## 2024-01-13 DIAGNOSIS — F419 Anxiety disorder, unspecified: Secondary | ICD-10-CM | POA: Diagnosis not present

## 2024-01-13 DIAGNOSIS — K315 Obstruction of duodenum: Secondary | ICD-10-CM | POA: Diagnosis not present

## 2024-01-13 DIAGNOSIS — F32A Depression, unspecified: Secondary | ICD-10-CM | POA: Insufficient documentation

## 2024-01-13 DIAGNOSIS — I739 Peripheral vascular disease, unspecified: Secondary | ICD-10-CM | POA: Insufficient documentation

## 2024-01-13 DIAGNOSIS — Z09 Encounter for follow-up examination after completed treatment for conditions other than malignant neoplasm: Secondary | ICD-10-CM | POA: Diagnosis present

## 2024-01-13 HISTORY — PX: ESOPHAGOGASTRODUODENOSCOPY (EGD) WITH PROPOFOL: SHX5813

## 2024-01-13 HISTORY — PX: BALLOON DILATION: SHX5330

## 2024-01-13 LAB — GLUCOSE, CAPILLARY: Glucose-Capillary: 110 mg/dL — ABNORMAL HIGH (ref 70–99)

## 2024-01-13 SURGERY — ESOPHAGOGASTRODUODENOSCOPY (EGD) WITH PROPOFOL
Anesthesia: General

## 2024-01-13 MED ORDER — LACTATED RINGERS IV SOLN
INTRAVENOUS | Status: DC
Start: 2024-01-13 — End: 2024-01-13

## 2024-01-13 MED ORDER — SODIUM CHLORIDE 0.9% FLUSH: 3.0000 mL | Freq: Two times a day (BID) | INTRAVENOUS | Status: DC

## 2024-01-13 MED ORDER — SODIUM CHLORIDE 0.9% FLUSH: 3.0000 mL | INTRAVENOUS | Status: DC | PRN

## 2024-01-13 MED ORDER — LIDOCAINE HCL (CARDIAC) PF 100 MG/5ML IV SOSY
PREFILLED_SYRINGE | INTRAVENOUS | Status: DC | PRN
  Administered 2024-01-13: 100 mg via INTRATRACHEAL

## 2024-01-13 MED ORDER — PROPOFOL 10 MG/ML IV BOLUS
INTRAVENOUS | Status: DC | PRN
  Administered 2024-01-13 (×2): 80 mg via INTRAVENOUS

## 2024-01-13 NOTE — Op Note (Signed)
 [facility] Patient Name: Jillian [name] Procedure Date: 01/13/2024 10:21 AM MRN: [id number] Date of Birth: [birth year] Attending MD: Hennie Duos. Marletta Lor , [place], [id number] CSN: [id number] Age: 58 Admit Type: Outpatient Procedure:                Upper GI endoscopy Indications:              Follow-up of peptic ulcer, Follow-up of duodenal                            stenosis Providers:                Hennie Duos. Marletta Lor, DO, Tammy Vaught, RN, Dyann Ruddle Referring MD:              Medicines:                See the Anesthesia note for documentation of the                            administered medications Complications:            No immediate complications. Estimated Blood Loss:     Estimated blood loss was minimal. Procedure:                Pre-Anesthesia Assessment:                           - The anesthesia plan was to use monitored                            anesthesia care (MAC).                           After obtaining informed consent, the endoscope was                            passed under direct vision. Throughout the                            procedure, the patient's blood pressure, pulse, and                            oxygen saturations were monitored continuously. The                            GIF-H190 ([id number]) scope was introduced through the                            mouth, and advanced to the second part of duodenum.                            The upper GI endoscopy was accomplished without                            difficulty. The patient tolerated the procedure  well. Scope In: 10:43:49 AM Scope Out: 10:51:46 AM Total Procedure Duration: 0 hours 7 minutes 57 seconds  Findings:      A widely patent Schatzki ring was found in the distal esophagus.      Diffuse mild inflammation characterized by erythema was found in the       entire examined stomach. Biopsies were taken with a cold forceps for        Helicobacter pylori testing.      One non-bleeding cratered gastric ulcer with a clean ulcer base (Forrest       Class III) was found at the pylorus. The lesion was 6 mm in largest       dimension. Biopsies were taken with a cold forceps for histology.      An acquired benign-appearing, intrinsic stenosis was found in the first       portion of the duodenum and was traversed after dilation. A TTS dilator       was passed through the scope. Dilation with an 05-23-09 mm balloon dilator       was performed to 10 mm. The dilation site was examined and showed mild       mucosal disruption and moderate improvement in luminal narrowing.      The second portion of the duodenum was normal. Impression:               - Widely patent Schatzki ring.                           - Gastritis. Biopsied.                           - Non-bleeding gastric ulcer with a clean ulcer                            base (Forrest Class III). Biopsied.                           - Acquired duodenal stenosis. Dilated.                           - Normal second portion of the duodenum. Moderate Sedation:      Per Anesthesia Care Recommendation:           - Patient has a contact number available for                            emergencies. The signs and symptoms of potential                            delayed complications were discussed with the                            patient. Return to normal activities tomorrow.                            Written discharge instructions were provided to the                            patient.                           -  Resume previous diet.                           - Continue present medications.                           - Await pathology results.                           - Use a proton pump inhibitor PO BID.                           - No ibuprofen, naproxen, or other non-steroidal                            anti-inflammatory drugs.                           - Return to GI clinic in  3 months. Procedure Code(s):        --- Professional ---                           [id number], Esophagogastroduodenoscopy, flexible,                            transoral; with dilation of gastric/duodenal                            stricture(s) (eg, balloon, bougie)                           43239, 59, Esophagogastroduodenoscopy, flexible,                            transoral; with biopsy, single or multiple Diagnosis Code(s):        --- Professional ---                           K22.2, Esophageal obstruction                           K29.70, Gastritis, unspecified, without bleeding                           K25.9, Gastric ulcer, unspecified as acute or                            chronic, without hemorrhage or perforation                           K31.5, Obstruction of duodenum                           K27.9, Peptic ulcer, site unspecified, unspecified                            as acute or chronic, without hemorrhage or  perforation CPT copyright 2022 American Medical Association. All rights reserved. The codes documented in this report are preliminary and upon coder review may  be revised to meet current compliance requirements. Hennie Duos. Marletta Lor, DO Hennie Duos. Marletta Lor, DO 01/13/2024 10:59:08 AM This report has been signed electronically. Number of Addenda: 0

## 2024-01-13 NOTE — H&P (Signed)
 Primary Care Physician:  Anabel Halon, MD Primary Gastroenterologist:  Dr. Marletta Lor  Pre-Procedure History & Physical: HPI:  Jillian [name] is a 58 y.o. female is here for an EGD with possible dilation to be performed for follow-up of gastric ulcer, duodenal stricture  Past Medical History:  Diagnosis Date   Anxiety    Arthritis    Asthma    COPD (chronic obstructive pulmonary disease) (HCC)    Depression    Diabetes mellitus without complication (HCC)    High blood pressure    History of bladder problems    Migraines    Sleep apnea     Past Surgical History:  Procedure Laterality Date   APPENDECTOMY     BIOPSY  08/12/2023   Procedure: BIOPSY;  Surgeon: Lanelle Bal, DO;  Location: [facility];  Service: Endoscopy;;   BLADDER SURGERY     x3   COLONOSCOPY  06/2018   Dr. Marca Ancona: Normal terminal ileum with normal biopsies.  Evidence of prior end to side ileocolonic anastomosis in the cecum.  This was patent and was characterized by healthy-appearing mucosa.  Multiple small and large mouth diverticula noted in the sigmoid colon and descending colon.  Random colon biopsies were benign.  Recommended colonoscopy in 10 years.   ESOPHAGOGASTRODUODENOSCOPY (EGD) WITH PROPOFOL N/A 08/12/2023   Procedure: ESOPHAGOGASTRODUODENOSCOPY (EGD) WITH PROPOFOL;  Surgeon: Lanelle Bal, DO;  Location: [facility];  Service: Endoscopy;  Laterality: N/A;  245pm, asa 3   ileum resection  1982   LAPAROSCOPIC CHOLECYSTECTOMY     PARTIAL HYSTERECTOMY      Prior to Admission medications   Medication Sig Start Date End Date Taking? Authorizing Provider  albuterol (PROVENTIL) (2.5 MG/3ML) 0.083% nebulizer solution Take 3 mLs (2.5 mg total) by nebulization every 6 (six) hours as needed for wheezing or shortness of breath. 01/03/24  Yes Anabel Halon, MD  albuterol (VENTOLIN HFA) 108 (90 Base) MCG/ACT inhaler Inhale 2 puffs into the lungs every 6 (six) hours as needed for wheezing or shortness  of breath. 10/21/23  Yes Oretha Milch, MD  amitriptyline (ELAVIL) 10 MG tablet Take 1 tablet (10 mg total) by mouth at bedtime. 01/03/24  Yes Anabel Halon, MD  Aspirin-Acetaminophen-Caffeine (GOODY HEADACHE PO) Take 1 tablet by mouth as needed.   Yes [provider]  DULoxetine (CYMBALTA) 60 MG capsule Take 60 mg by mouth daily.   Yes [provider]  famotidine (PEPCID) 20 MG tablet Take 20 mg by mouth daily. 10/15/22  Yes [provider]  fluticasone (FLONASE) 50 MCG/ACT nasal spray Place 1 spray into both nostrils daily. 02/15/23  Yes Coralyn Helling, MD  Fluticasone-Umeclidin-Vilant (TRELEGY ELLIPTA) 100-62.5-25 MCG/ACT AEPB Inhale 1 puff into the lungs daily in the afternoon. 10/16/23  Yes Luciano Cutter, MD  Magnesium 200 MG TABS Take 200 mg by mouth daily at 12 noon. 10/15/22  Yes [provider]  metFORMIN (GLUCOPHAGE) 500 MG tablet Take 1 tablet (500 mg total) by mouth 2 (two) times daily with a meal. 01/03/24  Yes Patel, Earlie Lou, MD  olmesartan (BENICAR) 20 MG tablet Take 1 tablet (20 mg total) by mouth daily. 01/03/24  Yes Anabel Halon, MD  omeprazole (PRILOSEC) 40 MG capsule Take 1 capsule (40 mg total) by mouth 2 (two) times daily before a meal. 07/24/23 07/23/24 Yes Tanyika Barros, Hennie Duos, DO  potassium chloride (KLOR-CON) 10 MEQ tablet Take 1 tablet by mouth daily. 08/29/22  Yes [provider]  rosuvastatin (CRESTOR) 20 MG tablet Take 1 tablet (20 mg total) by mouth daily. 01/03/24  Yes Anabel Halon, MD  vitamin B-12 (CYANOCOBALAMIN) 1000 MCG tablet Take 1,000 mcg by mouth daily.   Yes [provider]  sucralfate (CARAFATE) 1 g tablet Take 1 tablet (1 g total) by mouth 4 (four) times daily. 07/24/23 10/30/23  Lanelle Bal, DO    Allergies as of 10/30/2023 - Review Complete 10/30/2023  Allergen Reaction Noted   Doxycycline Photosensitivity 08/23/2016   Banana Hives 07/10/2019    Family History  Problem Relation Age of Onset    Diabetes Mother    COPD Mother    Heart attack Father    Diabetes Sister    Colon cancer Neg Hx    Celiac disease Neg Hx    Inflammatory bowel disease Neg Hx     Social History   Socioeconomic History   Marital status: Divorced    Spouse name: Not on file   Number of children: 1   Years of education: Not on file   Highest education level: Some college, no degree  Occupational History   Occupation: Comptroller: [name]    Comment: currently on short term disability  Tobacco Use   Smoking status: Every Day    Current packs/day: 1.50    Average packs/day: 1.5 packs/day for 43.0 years (64.5 ttl pk-yrs)    Types: Cigarettes   Smokeless tobacco: Never   Tobacco comments:    2 ppd as of 01/03/24  Vaping Use   Vaping status: Never Used  Substance and Sexual Activity   Alcohol use: Yes    Comment: hardly ever   Drug use: No   Sexual activity: Not on file  Other Topics Concern   Not on file  Social History Narrative   Not on file   Social Drivers of Health   Financial Resource Strain: Medium Risk (12/30/2023)   Overall Financial Resource Strain (CARDIA)    Difficulty of Paying Living Expenses: Somewhat hard  Food Insecurity: Food Insecurity Present (12/30/2023)   Hunger Vital Sign    Worried About Running Out of Food in the Last Year: Patient declined    Ran Out of Food in the Last Year: Sometimes true  Transportation Needs: No Transportation Needs (12/30/2023)   PRAPARE - Administrator, Civil Service (Medical): No    Lack of Transportation (Non-Medical): No  Physical Activity: Unknown (12/30/2023)   Exercise Vital Sign    Days of Exercise per Week: 0 days    Minutes of Exercise per Session: Not on file  Stress: Stress Concern Present (12/30/2023)   [employer] of Occupational Health - Occupational Stress Questionnaire    Feeling of Stress : Very much  Social Connections: Socially Isolated (12/30/2023)   Social Connection and  Isolation Panel [NHANES]    Frequency of Communication with Friends and Family: Twice a week    Frequency of Social Gatherings with Friends and Family: Never    Attends Religious Services: Never    Database administrator or Organizations: No    Attends Engineer, structural: Not on file    Marital Status: Divorced  Intimate Partner Violence: Not on file    Review of Systems: General: Negative for fever, chills, fatigue, weakness. Eyes: Negative for vision changes.  ENT: Negative for hoarseness, difficulty swallowing , nasal congestion. CV: Negative for chest pain, angina, palpitations, dyspnea on exertion, peripheral edema.  Respiratory: Negative for  dyspnea at rest, dyspnea on exertion, cough, sputum, wheezing.  GI: See history of present illness. GU:  Negative for dysuria, hematuria, urinary incontinence, urinary frequency, nocturnal urination.  MS: Negative for joint pain, low back pain.  Derm: Negative for rash or itching.  Neuro: Negative for weakness, abnormal sensation, seizure, frequent headaches, memory loss, confusion.  Psych: Negative for anxiety, depression Endo: Negative for unusual weight change.  Heme: Negative for bruising or bleeding. Allergy: Negative for rash or hives.  Physical Exam: Vital signs in last 24 hours: Temp:  [98.3 F (36.8 C)] 98.3 F (36.8 C) (03/31 0921) Pulse Rate:  [86] 86 (03/31 0921) Resp:  [14] 14 (03/31 0921) BP: (112)/(57) 112/57 (03/31 0921) SpO2:  [95 %] 95 % (03/31 0921) Weight:  [77.4 kg] 77.4 kg (03/31 0921)   General:   Alert,  Well-developed, well-nourished, pleasant and cooperative in NAD Head:  Normocephalic and atraumatic. Eyes:  Sclera clear, no icterus.   Conjunctiva pink. Ears:  Normal auditory acuity. Nose:  No deformity, discharge,  or lesions. Msk:  Symmetrical without gross deformities. Normal posture. Extremities:  Without clubbing or edema. Neurologic:  Alert and  oriented x4;  grossly normal  neurologically. Skin:  Intact without significant lesions or rashes. Psych:  Alert and cooperative. Normal mood and affect.   Impression/Plan: Jillian [name] is here for an EGD with possible dilation to be performed for follow-up of gastric ulcer, duodenal stricture  Risks, benefits, limitations, imponderables and alternatives regarding procedure have been reviewed with the patient. Questions have been answered. All parties agreeable.

## 2024-01-13 NOTE — Anesthesia Preprocedure Evaluation (Signed)
 Anesthesia Evaluation  Patient identified by MRN, date of birth, ID band Patient awake    Reviewed: Allergy & Precautions, H&P , NPO status , Patient's Chart, lab work & pertinent test results, reviewed documented beta blocker date and time   Airway Mallampati: II  TM Distance: >3 FB Neck ROM: full    Dental no notable dental hx. (+) Dental Advisory Given, Teeth Intact   Pulmonary shortness of breath, asthma , sleep apnea , COPD, Current Smoker   Pulmonary exam normal breath sounds clear to auscultation       Cardiovascular Exercise Tolerance: Good hypertension, + Peripheral Vascular Disease  Normal cardiovascular exam Rhythm:regular Rate:Normal     Neuro/Psych  Headaches PSYCHIATRIC DISORDERS Anxiety Depression    negative neurological ROS  negative psych ROS   GI/Hepatic negative GI ROS, Neg liver ROS,,,  Endo/Other  diabetes, Type 2    Renal/GU Renal artery stenosis  negative genitourinary   Musculoskeletal  (+) Arthritis , Osteoarthritis,  Fibromyalgia -  Abdominal   Peds  Hematology negative hematology ROS (+)   Anesthesia Other Findings   Reproductive/Obstetrics negative OB ROS                             Anesthesia Physical Anesthesia Plan  ASA: 3  Anesthesia Plan: General   Post-op Pain Management: Minimal or no pain anticipated   Induction: Intravenous  PONV Risk Score and Plan: Propofol infusion  Airway Management Planned: Natural Airway and Nasal Cannula  Additional Equipment: None  Intra-op Plan:   Post-operative Plan:   Informed Consent: I have reviewed the patients History and Physical, chart, labs and discussed the procedure including the risks, benefits and alternatives for the proposed anesthesia with the patient or authorized representative who has indicated his/her understanding and acceptance.     Dental Advisory Given  Plan Discussed with:  CRNA  Anesthesia Plan Comments:        Anesthesia Quick Evaluation

## 2024-01-13 NOTE — Anesthesia Postprocedure Evaluation (Signed)
 Anesthesia Post Note  Patient: Jillian [name]  Procedure(s) Performed: ESOPHAGOGASTRODUODENOSCOPY (EGD) WITH PROPOFOL BALLOON DILATION  Patient location during evaluation: PACU Anesthesia Type: General Level of consciousness: awake and alert Pain management: pain level controlled Vital Signs Assessment: post-procedure vital signs reviewed and stable Respiratory status: spontaneous breathing, nonlabored ventilation, respiratory function stable and patient connected to nasal cannula oxygen Cardiovascular status: blood pressure returned to baseline and stable Postop Assessment: no apparent nausea or vomiting Anesthetic complications: no   There were no known notable events for this encounter.   Last Vitals:  Vitals:   01/13/24 0921 01/13/24 1057  BP: (!) 112/57 (!) 100/49  Pulse: 86 90  Resp: 14 19  Temp: 36.8 C 36.5 C  SpO2: 95% 100%    Last Pain:  Vitals:   01/13/24 1057  TempSrc:   PainSc: 0-No pain                 Sten Dematteo L Sonni Barse

## 2024-01-13 NOTE — Discharge Instructions (Signed)
 EGD Discharge instructions Please read the instructions outlined below and refer to this sheet in the next few weeks. These discharge instructions provide you with general information on caring for yourself after you leave the hospital. Your doctor may also give you specific instructions. While your treatment has been planned according to the most current medical practices available, unavoidable complications occasionally occur. If you have any problems or questions after discharge, please call your doctor. ACTIVITY You may resume your regular activity but move at a slower pace for the next 24 hours.  Take frequent rest periods for the next 24 hours.  Walking will help expel (get rid of) the air and reduce the bloated feeling in your abdomen.  No driving for 24 hours (because of the anesthesia (medicine) used during the test).  You may shower.  Do not sign any important legal documents or operate any machinery for 24 hours (because of the anesthesia used during the test).  NUTRITION Drink plenty of fluids.  You may resume your normal diet.  Begin with a light meal and progress to your normal diet.  Avoid alcoholic beverages for 24 hours or as instructed by your caregiver.  MEDICATIONS You may resume your normal medications unless your caregiver tells you otherwise.  WHAT YOU CAN EXPECT TODAY You may experience abdominal discomfort such as a feeling of fullness or "gas" pains.  FOLLOW-UP Your doctor will discuss the results of your test with you.  SEEK IMMEDIATE MEDICAL ATTENTION IF ANY OF THE FOLLOWING OCCUR: Excessive nausea (feeling sick to your stomach) and/or vomiting.  Severe abdominal pain and distention (swelling).  Trouble swallowing.  Temperature over 101 F (37.8 C).  Rectal bleeding or vomiting of blood.   Your EGD revealed mild amount inflammation in your stomach.  I took biopsies of this to rule out infection with a bacteria called H. pylori.  Ulcers still present though  slightly improved.  Await pathology results, my office will contact you.  Stricture again noted in your small bowel.  I dilated this today.  Recommend starting Colace 1-2 times daily.  You can take Dulcolax on top of this as needed.  Follow-up in GI office in 3 months.  I hope you have a great rest of your week!  Hennie Duos. Marletta Lor, D.O. Gastroenterology and Hepatology [facility]

## 2024-01-13 NOTE — Transfer of Care (Signed)
 Immediate Anesthesia Transfer of Care Note  Patient: Jillian [name]  Procedure(s) Performed: ESOPHAGOGASTRODUODENOSCOPY (EGD) WITH PROPOFOL BALLOON DILATION  Patient Location: Endoscopy Unit  Anesthesia Type:General  Level of Consciousness: awake, alert , and oriented  Airway & Oxygen Therapy: Patient Spontanous Breathing and Patient connected to face mask oxygen  Post-op Assessment: Report given to RN and Post -op Vital signs reviewed and stable  Post vital signs: Reviewed and stable  Last Vitals:  Vitals Value Taken Time  BP 100/49 01/13/24 1057  Temp 36.5 C 01/13/24 1057  Pulse 90 01/13/24 1057  Resp 19 01/13/24 1057  SpO2 100 % 01/13/24 1057    Last Pain:  Vitals:   01/13/24 1057  TempSrc:   PainSc: 0-No pain         Complications: No notable events documented.

## 2024-01-14 ENCOUNTER — Encounter (HOSPITAL_COMMUNITY): Payer: Self-pay | Admitting: Internal Medicine

## 2024-01-14 LAB — SURGICAL PATHOLOGY

## 2024-01-20 ENCOUNTER — Encounter: Payer: Self-pay | Admitting: Internal Medicine

## 2024-01-22 ENCOUNTER — Encounter: Payer: Self-pay | Admitting: Internal Medicine

## 2024-01-23 ENCOUNTER — Ambulatory Visit

## 2024-01-24 ENCOUNTER — Encounter: Payer: Self-pay | Admitting: Internal Medicine

## 2024-01-26 ENCOUNTER — Encounter (HOSPITAL_BASED_OUTPATIENT_CLINIC_OR_DEPARTMENT_OTHER): Payer: Self-pay | Admitting: Pulmonary Disease

## 2024-01-26 ENCOUNTER — Other Ambulatory Visit: Payer: Self-pay | Admitting: Internal Medicine

## 2024-01-26 DIAGNOSIS — M797 Fibromyalgia: Secondary | ICD-10-CM

## 2024-01-27 ENCOUNTER — Other Ambulatory Visit: Payer: Self-pay | Admitting: Internal Medicine

## 2024-01-27 ENCOUNTER — Telehealth: Payer: Self-pay

## 2024-01-27 DIAGNOSIS — B3731 Acute candidiasis of vulva and vagina: Secondary | ICD-10-CM

## 2024-01-27 MED ORDER — FLUCONAZOLE 150 MG PO TABS: 150.0000 mg | ORAL_TABLET | Freq: Once | ORAL | 0 refills | Status: AC

## 2024-01-27 NOTE — Telephone Encounter (Signed)
 Lm for patient.

## 2024-01-28 ENCOUNTER — Telehealth (HOSPITAL_BASED_OUTPATIENT_CLINIC_OR_DEPARTMENT_OTHER): Payer: Self-pay

## 2024-01-28 ENCOUNTER — Encounter: Payer: Self-pay | Admitting: Internal Medicine

## 2024-01-28 ENCOUNTER — Encounter (HOSPITAL_BASED_OUTPATIENT_CLINIC_OR_DEPARTMENT_OTHER): Payer: Self-pay | Admitting: Pulmonary Disease

## 2024-01-28 NOTE — Telephone Encounter (Signed)
 Left pt message to return call as she will need an appt  Copied from CRM [id number]. Topic: General - Other >> Jan 27, 2024 12:21 PM Jillian [name] wrote: Reason for CRM: Patient returning call to [facility] Would like a call back

## 2024-01-28 NOTE — Telephone Encounter (Signed)
 Looked through chart to find  my chart message about sick patient, since Dr Villa Greaser is out pt will need appt for sick visit as she is complaining of croupy cough, no energy, chest tightness and requesting ABX. Left pt message to return call to office. She does have an appt with her primary care tomorrow already booked if she wants to mention to them about this sickness

## 2024-01-28 NOTE — Telephone Encounter (Signed)
 Spoke with pt she is going to call her PCP as we have no availability

## 2024-01-29 ENCOUNTER — Telehealth: Payer: Self-pay

## 2024-01-29 ENCOUNTER — Ambulatory Visit: Payer: Medicare Other | Admitting: Internal Medicine

## 2024-01-29 DIAGNOSIS — J209 Acute bronchitis, unspecified: Secondary | ICD-10-CM

## 2024-01-29 DIAGNOSIS — M797 Fibromyalgia: Secondary | ICD-10-CM

## 2024-01-29 DIAGNOSIS — J440 Chronic obstructive pulmonary disease with acute lower respiratory infection: Secondary | ICD-10-CM | POA: Diagnosis not present

## 2024-01-29 MED ORDER — PREDNISONE 10 MG (21) PO TBPK
ORAL_TABLET | ORAL | 0 refills | Status: DC
Start: 2024-01-29 — End: 2024-02-20

## 2024-01-29 MED ORDER — AZITHROMYCIN 250 MG PO TABS: ORAL_TABLET | ORAL | 0 refills | Status: AC

## 2024-01-29 MED ORDER — PREGABALIN 75 MG PO CAPS
75.0000 mg | ORAL_CAPSULE | Freq: Two times a day (BID) | ORAL | 2 refills | Status: DC
Start: 2024-01-29 — End: 2024-02-07

## 2024-01-29 MED ORDER — FLUCONAZOLE 150 MG PO TABS: 150.0000 mg | ORAL_TABLET | Freq: Once | ORAL | 0 refills | Status: AC

## 2024-01-29 NOTE — Progress Notes (Addendum)
 Acute Office Visit  Subjective:     Patient ID: Jillian [name], female    DOB: 29-Mar-1966, 58 y.o.   MRN: [id number]  Chief Complaint  Patient presents with   Medical Management of Chronic Issues    Pt states "She has a cough, Chest is tight, Pt also states she has stage 4 COPD, Neck is still stiff and hurting but not as bad, Pt done home covid test and it is negative, Pt also having body aches, arms still tingling at night and they wake her up"   Virtual Visit via Telephone Note  I connected with Jillian [name] on 02/13/24 at  2:20 PM EDT by telephone and verified that I am speaking with the correct person using two identifiers.  Location: Patient: home Provider: office-[facility]   I discussed the limitations, risks, security and privacy concerns of performing an evaluation and management service by telephone and the availability of in person appointments. I also discussed with the patient that there may be a patient responsible charge related to this service. The patient expressed understanding and agreed to proceed.   HPI Patient is in today for Acute Bronchitis: Patient presents for presents evaluation of dyspnea, nonproductive cough, and wheezing. Symptoms began 2 weeks ago and are unchanged since that time.  Past history is significant for chronic bronchitis and COPD.   ROS      Objective:    There were no vitals taken for this visit. BP Readings from Last 3 Encounters:  01/13/24 (!) 100/49  01/03/24 130/60  08/12/23 (!) 103/47   Wt Readings from Last 3 Encounters:  01/13/24 170 lb 10.2 oz (77.4 kg)  01/09/24 170 lb 12.8 oz (77.5 kg)  01/03/24 170 lb 12.8 oz (77.5 kg)      Physical Exam Vitals and nursing note reviewed.  Constitutional:      Appearance: Normal appearance.  HENT:     Head: Normocephalic.     Right Ear: Tympanic membrane, ear canal and external ear normal.     Left Ear: Tympanic membrane, ear canal and external ear normal.     Nose: Nose normal.      Mouth/Throat:     Mouth: Mucous membranes are moist.     Pharynx: Oropharynx is clear.  Eyes:     Extraocular Movements: Extraocular movements intact.     Pupils: Pupils are equal, round, and reactive to light.  Cardiovascular:     Rate and Rhythm: Normal rate and regular rhythm.  Pulmonary:     Effort: Pulmonary effort is normal.     Breath sounds: Examination of the right-upper field reveals wheezing. Examination of the right-middle field reveals wheezing and rhonchi. Examination of the left-middle field reveals wheezing and rhonchi. Wheezing and rhonchi present.  Musculoskeletal:     Cervical back: Normal range of motion and neck supple.  Skin:    General: Skin is warm and dry.  Neurological:     Mental Status: She is alert and oriented to person, place, and time.  Psychiatric:        Mood and Affect: Mood normal.        Thought Content: Thought content normal.     No results found for any visits on 01/29/24.      Assessment & Plan:   I discussed the assessment and treatment plan with the patient. The patient was provided an opportunity to ask questions and all were answered. The patient agreed with the plan and demonstrated an understanding of the  instructions.   The patient was advised to call back or seek an in-person evaluation if the symptoms worsen or if the condition fails to improve as anticipated.  I provided 7 minutes of non-face-to-face time during this encounter.   Alison Irvine, FNP   Problem List Items Addressed This Visit       Respiratory   Acute bronchitis with COPD (HCC)   Treat with oral atbx and steroid taper.  Recommend f/u with pulmonary if no improvement in symptoms.       Relevant Medications   azithromycin  (ZITHROMAX ) 250 MG tablet   predniSONE  (STERAPRED UNI-PAK 21 TAB) 10 MG (21) TBPK tablet     Other   Fibromyalgia - Primary   Not well controlled with 60 mg Cymbalta .  Will add Lyrica  for added pain relief.  Recommend f/u in 3 months  or sooner if needed.         Relevant Medications   predniSONE  (STERAPRED UNI-PAK 21 TAB) 10 MG (21) TBPK tablet   pregabalin  (LYRICA ) 75 MG capsule    Meds ordered this encounter  Medications   azithromycin  (ZITHROMAX ) 250 MG tablet    Sig: Take 2 tablets on day 1, then 1 tablet daily on days 2 through 5    Dispense:  6 tablet    Refill:  0   predniSONE  (STERAPRED UNI-PAK 21 TAB) 10 MG (21) TBPK tablet    Sig: Take according to package instructions.    Dispense:  21 tablet    Refill:  0   pregabalin  (LYRICA ) 75 MG capsule    Sig: Take 1 capsule (75 mg total) by mouth 2 (two) times daily.    Dispense:  60 capsule    Refill:  2   fluconazole  (DIFLUCAN ) 150 MG tablet    Sig: Take 1 tablet (150 mg total) by mouth once for 1 dose. Repeat in one week if symptoms not improved.    Dispense:  2 tablet    Refill:  0    No follow-ups on file.  Alison Irvine, FNP

## 2024-01-30 ENCOUNTER — Telehealth: Payer: Self-pay | Admitting: Internal Medicine

## 2024-01-30 ENCOUNTER — Other Ambulatory Visit: Payer: Self-pay | Admitting: Internal Medicine

## 2024-01-30 ENCOUNTER — Encounter: Payer: Self-pay | Admitting: Internal Medicine

## 2024-01-30 DIAGNOSIS — M797 Fibromyalgia: Secondary | ICD-10-CM

## 2024-01-30 MED ORDER — DULOXETINE HCL 60 MG PO CPEP: 60.0000 mg | ORAL_CAPSULE | Freq: Two times a day (BID) | ORAL | 1 refills | Status: DC

## 2024-01-30 NOTE — Telephone Encounter (Signed)
 Message responded to via mychart by provider.  Alison Irvine, FNP to Jillian [name]  [facility]    01/30/24 12:25 PM Take one at night, just to start with.  Then after 3-5 days increase to two times a day.      Copied from CRM [id number]. Topic: Clinical - Medication Question >> Jan 30, 2024 10:49 AM Crispin Dolphin wrote: Reason for CRM: Patient called. States she also sent a [employer]. She picked up her medication and the instructions read different from what she thinks provider told her at appt for pregabalin (LYRICA) 75 MG capsule. Says she was told to take 1 but rx says take two per day. What like to make sure she is taking it correctly. Thank You

## 2024-01-31 DIAGNOSIS — J209 Acute bronchitis, unspecified: Secondary | ICD-10-CM | POA: Insufficient documentation

## 2024-01-31 NOTE — Assessment & Plan Note (Signed)
 Treat with oral atbx and steroid taper.  Recommend f/u with pulmonary if no improvement in symptoms.

## 2024-01-31 NOTE — Assessment & Plan Note (Addendum)
 Not well controlled with 60 mg Cymbalta .  Will add Lyrica  for added pain relief.  Recommend f/u in 3 months or sooner if needed.

## 2024-02-04 ENCOUNTER — Ambulatory Visit: Payer: Medicare Other | Admitting: Internal Medicine

## 2024-02-07 ENCOUNTER — Other Ambulatory Visit: Payer: Self-pay | Admitting: Internal Medicine

## 2024-02-07 DIAGNOSIS — K219 Gastro-esophageal reflux disease without esophagitis: Secondary | ICD-10-CM

## 2024-02-07 DIAGNOSIS — F5101 Primary insomnia: Secondary | ICD-10-CM

## 2024-02-11 ENCOUNTER — Encounter: Payer: Self-pay | Admitting: Internal Medicine

## 2024-02-12 DIAGNOSIS — F5101 Primary insomnia: Secondary | ICD-10-CM | POA: Insufficient documentation

## 2024-02-12 DIAGNOSIS — K219 Gastro-esophageal reflux disease without esophagitis: Secondary | ICD-10-CM | POA: Insufficient documentation

## 2024-02-12 MED ORDER — TRAZODONE HCL 50 MG PO TABS: 50.0000 mg | ORAL_TABLET | Freq: Every evening | ORAL | 1 refills | Status: DC | PRN

## 2024-02-12 MED ORDER — FAMOTIDINE 20 MG PO TABS
20.0000 mg | ORAL_TABLET | Freq: Every day | ORAL | 3 refills | Status: AC
Start: 2024-02-12 — End: ?

## 2024-02-12 NOTE — Addendum Note (Signed)
 Addended byCleola Dach on: 02/12/2024 12:04 PM   Modules accepted: Orders

## 2024-02-19 ENCOUNTER — Encounter: Payer: Self-pay | Admitting: Internal Medicine

## 2024-02-20 ENCOUNTER — Telehealth: Admitting: Internal Medicine

## 2024-02-20 ENCOUNTER — Encounter: Payer: Self-pay | Admitting: Internal Medicine

## 2024-02-20 DIAGNOSIS — Z720 Tobacco use: Secondary | ICD-10-CM

## 2024-02-20 DIAGNOSIS — F17210 Nicotine dependence, cigarettes, uncomplicated: Secondary | ICD-10-CM | POA: Diagnosis not present

## 2024-02-20 DIAGNOSIS — F5101 Primary insomnia: Secondary | ICD-10-CM

## 2024-02-20 DIAGNOSIS — J449 Chronic obstructive pulmonary disease, unspecified: Secondary | ICD-10-CM | POA: Diagnosis not present

## 2024-02-20 DIAGNOSIS — M797 Fibromyalgia: Secondary | ICD-10-CM | POA: Diagnosis not present

## 2024-02-20 MED ORDER — BUPROPION HCL ER (SR) 150 MG PO TB12: 150.0000 mg | ORAL_TABLET | Freq: Two times a day (BID) | ORAL | 2 refills | Status: DC

## 2024-02-20 MED ORDER — CYCLOBENZAPRINE HCL 5 MG PO TABS: 5.0000 mg | ORAL_TABLET | Freq: Two times a day (BID) | ORAL | 1 refills | Status: DC | PRN

## 2024-02-20 NOTE — Assessment & Plan Note (Signed)
 Uncontrolled, likely due to poor sleep quality Currently takes Cymbalta  60 mg twice daily Tried Elavil  10 mg at bedtime, but she could not tolerate it NP tried to prescribe Lyrica  75 mg twice daily, but had excessive nausea and tiredness with it, can try a lower dose later Added Flexeril 5 mg nightly

## 2024-02-20 NOTE — Assessment & Plan Note (Signed)
 Overall well controlled with Trelegy and as needed albuterol Refilled albuterol neb Needs to cut down -> quit smoking Followed by pulmonology

## 2024-02-20 NOTE — Assessment & Plan Note (Signed)
 Smokes about 2 pack/day  Asked about quitting: confirms that he/she currently smokes cigarettes Advise to quit smoking: Educated about QUITTING to reduce the risk of cancer, cardio and cerebrovascular disease. Assess willingness: Unwilling to quit at this time, but is working on cutting back. Assist with counseling and pharmacotherapy: Counseled for 5 minutes. Started Wellbutrin  150 mg BID. Arrange for follow up: follow up in 3 months and continue to offer help.

## 2024-02-20 NOTE — Progress Notes (Signed)
 Virtual Visit via Video Note   Because of Jillian [name]'s co-morbid illnesses, she is at least at moderate risk for complications without adequate follow up.  This format is felt to be most appropriate for this patient at this time.  All issues noted in this document were discussed and addressed.  A limited physical exam was performed with this format.      Evaluation Performed:  Follow-up visit  Date:  02/20/2024   ID:  Jillian [name], DOB 1966/08/04, MRN [id number]  Patient Location: Home Provider Location: Office/Clinic  Participants: Patient Location of Patient: Home Location of Provider: Telehealth Consent was obtain for visit to be over via telehealth. I verified that I am speaking with the correct person using two identifiers.  PCP:  Meldon Sport, MD   Chief Complaint: Smoking cessation and fibromyalgia  History of Present Illness:    Jillian [name] is a 58 y.o. female with with PMH of HTN, COPD, OSA, type II DM, GERD and tobacco abuse who has a video visit for discussion of smoking cessation.  She is smoking about 2-3 packs per day lately due to worsening of stress.  She has taken Wellbutrin  in the past with adequate response for smoking cessation.  Fibromyalgia: She is currently taking Cymbalta  60 mg BID.  She still feels tightness in the shoulder and neck area.  She has tried amitriptyline  and later on Lyrica , but did not tolerate them.  Denies any recent injury or pain.  She has difficulty initiating and maintaining sleep despite taking trazodone  50 mg.  The patient does not have symptoms concerning for COVID-19 infection (fever, chills, cough, or new shortness of breath).   Past Medical, Surgical, Social History, Allergies, and Medications have been Reviewed.  Past Medical History:  Diagnosis Date   Anxiety    Arthritis    Asthma    COPD (chronic obstructive pulmonary disease) (HCC)    Depression    Diabetes mellitus without complication (HCC)    High  blood pressure    History of bladder problems    Migraines    Sleep apnea    Past Surgical History:  Procedure Laterality Date   APPENDECTOMY     BALLOON DILATION N/A 01/13/2024   Procedure: BALLOON DILATION;  Surgeon: Vinetta Greening, DO;  [facility];  Service: Endoscopy;  Laterality: N/A;   BIOPSY  08/12/2023   Procedure: BIOPSY;  Surgeon: Vinetta Greening, DO;  [facility]: [facility];  Service: Endoscopy;;   BLADDER SURGERY     x3   COLONOSCOPY  06/2018   Dr. Feliberto Hopping: Normal terminal ileum with normal biopsies.  Evidence of prior end to side ileocolonic anastomosis in the cecum.  This was patent and was characterized by healthy-appearing mucosa.  Multiple small and large mouth diverticula noted in the sigmoid colon and descending colon.  Random colon biopsies were benign.  Recommended colonoscopy in 10 years.   ESOPHAGOGASTRODUODENOSCOPY (EGD) WITH PROPOFOL  N/A 08/12/2023   Procedure: ESOPHAGOGASTRODUODENOSCOPY (EGD) WITH PROPOFOL ;  Surgeon: Vinetta Greening, DO;  Location: [facility];  Service: Endoscopy;  Laterality: N/A;  245pm, asa 3   ESOPHAGOGASTRODUODENOSCOPY (EGD) WITH PROPOFOL  N/A 01/13/2024   Procedure: ESOPHAGOGASTRODUODENOSCOPY (EGD) WITH PROPOFOL ;  Surgeon: Vinetta Greening, DO;  Location: [facility];  Service: Endoscopy;  Laterality: N/A;  1015am, asa 3   ileum resection  1982   LAPAROSCOPIC CHOLECYSTECTOMY     PARTIAL HYSTERECTOMY       Current Meds  Medication Sig   albuterol  (PROVENTIL ) (2.5 MG/3ML) 0.083% nebulizer solution Take 3 mLs (2.5 mg total) by nebulization every 6 (six) hours as needed for wheezing or shortness of breath.   albuterol  (VENTOLIN  HFA) 108 (90 Base) MCG/ACT inhaler Inhale 2 puffs into the lungs every 6 (six) hours as needed for wheezing or shortness of breath.   Aspirin-Acetaminophen -Caffeine (GOODY HEADACHE PO) Take 1 tablet by mouth as needed.   buPROPion  (WELLBUTRIN  SR) 150 MG 12 hr tablet Take 1 tablet (150 mg total) by  mouth 2 (two) times daily.   DULoxetine  (CYMBALTA ) 60 MG capsule Take 1 capsule (60 mg total) by mouth 2 (two) times daily.   famotidine  (PEPCID ) 20 MG tablet Take 1 tablet (20 mg total) by mouth daily.   fluticasone  (FLONASE ) 50 MCG/ACT nasal spray Place 1 spray into both nostrils daily.   Fluticasone -Umeclidin-Vilant (TRELEGY ELLIPTA ) 100-62.5-25 MCG/ACT AEPB Inhale 1 puff into the lungs daily in the afternoon.   Magnesium 200 MG TABS Take 200 mg by mouth daily at 12 noon.   metFORMIN  (GLUCOPHAGE ) 500 MG tablet Take 1 tablet (500 mg total) by mouth 2 (two) times daily with a meal.   olmesartan  (BENICAR ) 20 MG tablet Take 1 tablet (20 mg total) by mouth daily.   omeprazole  (PRILOSEC) 40 MG capsule Take 1 capsule (40 mg total) by mouth 2 (two) times daily before a meal.   rosuvastatin  (CRESTOR ) 20 MG tablet Take 1 tablet (20 mg total) by mouth daily.   traZODone  (DESYREL ) 50 MG tablet Take 1 tablet (50 mg total) by mouth at bedtime as needed for sleep.   vitamin B-12 (CYANOCOBALAMIN) 1000 MCG tablet Take 1,000 mcg by mouth daily.     Allergies:   Doxycycline and Banana   ROS:   Please see the history of present illness. All other systems reviewed and are negative.   Labs/Other Tests and Data Reviewed:    Recent Labs: 01/03/2024: ALT 44; BUN 23; Creatinine, Ser 1.39; Hemoglobin 14.2; Platelets 292; Potassium 5.1; Sodium 139; TSH 1.900   Recent Lipid Panel Lab Results  Component Value Date/Time   CHOL 92 (L) 01/03/2024 09:23 AM   TRIG 204 (H) 01/03/2024 09:23 AM   HDL 39 (L) 01/03/2024 09:23 AM   CHOLHDL 2.4 01/03/2024 09:23 AM   LDLCALC 21 01/03/2024 09:23 AM    Wt Readings from Last 3 Encounters:  01/13/24 170 lb 10.2 oz (77.4 kg)  01/09/24 170 lb 12.8 oz (77.5 kg)  01/03/24 170 lb 12.8 oz (77.5 kg)     Objective:    Vital Signs:  There were no vitals taken for this visit.   VITAL SIGNS:  reviewed GEN:  no acute distress EYES:  sclerae anicteric, EOMI - Extraocular  Movements Intact RESPIRATORY:  normal respiratory effort, symmetric expansion NEURO:  alert and oriented x 3, no obvious focal deficit PSYCH:  normal affect  ASSESSMENT & PLAN:    Tobacco abuse Smokes about 2 pack/day  Asked about quitting: confirms that he/she currently smokes cigarettes Advise to quit smoking: Educated about QUITTING to reduce the risk of cancer, cardio and cerebrovascular disease. Assess willingness: Unwilling to quit at this time, but is working on cutting back. Assist with counseling and pharmacotherapy: Counseled for 5 minutes. Started Wellbutrin  150 mg BID. Arrange for follow up: follow up in 3 months and continue to offer help.  Fibromyalgia Uncontrolled, likely due to poor sleep quality Currently takes Cymbalta  60 mg twice daily Tried Elavil  10 mg at bedtime, but she could not  tolerate it NP tried to prescribe Lyrica  75 mg twice daily, but had excessive nausea and tiredness with it, can try a lower dose later Added Flexeril 5 mg nightly  Primary insomnia Takes trazodone  50 mg nightly Still has difficulty initiating and maintaining sleep Tried amitriptyline  without any relief She has Flexeril now for fibromyalgia, can also help with sleep  COPD (chronic obstructive pulmonary disease) (HCC) Overall well controlled with Trelegy and as needed albuterol  Refilled albuterol  neb Needs to cut down -> quit smoking Followed by pulmonology    I discussed the assessment and treatment plan with the patient. The patient was provided an opportunity to ask questions, and all were answered. The patient agreed with the plan and demonstrated an understanding of the instructions.   The patient was advised to call back or seek an in-person evaluation if the symptoms worsen or if the condition fails to improve as anticipated.  The above assessment and management plan was discussed with the patient. The patient verbalized understanding of and has agreed to the management  plan.   Medication Adjustments/Labs and Tests Ordered: Current medicines are reviewed at length with the patient today.  Concerns regarding medicines are outlined above.   Tests Ordered: No orders of the defined types were placed in this encounter.   Medication Changes: Meds ordered this encounter  Medications   buPROPion  (WELLBUTRIN  SR) 150 MG 12 hr tablet    Sig: Take 1 tablet (150 mg total) by mouth 2 (two) times daily.    Dispense:  60 tablet    Refill:  2     Note: This dictation was prepared with Dragon dictation along with smaller phrase technology. Similar sounding words can be transcribed inadequately or may not be corrected upon review. Any transcriptional errors that result from this process are unintentional.      Disposition:  Follow up  Signed, Meldon Sport, MD  02/20/2024 2:18 PM     [name]

## 2024-02-20 NOTE — Assessment & Plan Note (Signed)
 Takes trazodone  50 mg nightly Still has difficulty initiating and maintaining sleep Tried amitriptyline  without any relief She has Flexeril now for fibromyalgia, can also help with sleep

## 2024-02-26 ENCOUNTER — Encounter: Payer: Self-pay | Admitting: Internal Medicine

## 2024-03-04 ENCOUNTER — Encounter: Payer: Self-pay | Admitting: Internal Medicine

## 2024-03-14 ENCOUNTER — Other Ambulatory Visit: Payer: Self-pay | Admitting: Internal Medicine

## 2024-03-14 DIAGNOSIS — Z720 Tobacco use: Secondary | ICD-10-CM

## 2024-03-16 ENCOUNTER — Encounter: Payer: Self-pay | Admitting: Internal Medicine

## 2024-03-24 ENCOUNTER — Encounter (HOSPITAL_BASED_OUTPATIENT_CLINIC_OR_DEPARTMENT_OTHER): Payer: Self-pay | Admitting: Pulmonary Disease

## 2024-03-25 ENCOUNTER — Encounter (HOSPITAL_BASED_OUTPATIENT_CLINIC_OR_DEPARTMENT_OTHER): Payer: Self-pay | Admitting: Pulmonary Disease

## 2024-03-25 MED ORDER — PREDNISONE 10 MG PO TABS: ORAL_TABLET | ORAL | 0 refills | Status: DC

## 2024-03-25 NOTE — Telephone Encounter (Signed)
**Note De-identified  [place] Obfuscation** Please advise 

## 2024-03-25 NOTE — Telephone Encounter (Signed)
 Duplicate

## 2024-03-26 ENCOUNTER — Ambulatory Visit: Payer: Self-pay

## 2024-03-26 NOTE — Telephone Encounter (Signed)
 Copied from CRM [id number]. Topic: Clinical - Red Word Triage >> Mar 26, 2024 10:27 AM Tyronne Galloway wrote: Red Word that prompted transfer to Nurse Triage: Pt used to see Dr. Matilde Son and needs a transfer of care to a pulm provider in [place] pulm clinic. Pt stated she has weakness, pain all over, difficulty breathing and shortness of breath. Pt stated she has COPD and sleep insomnia. Pt does not have an appt scheduled.   Reason for Disposition  ALSO, mild central chest pain occurs only when coughing    Pulmonary appointment on 8/11, PCP appointment on 6/23  Answer Assessment - Initial Assessment Questions 1. ONSET: When did the cough begin?      Over 2 weeks 2. SEVERITY: How bad is the cough today?      Moderate 3. SPUTUM: Describe the color of your sputum (none, dry cough; clear, white, yellow, green)     No 4. HEMOPTYSIS: Are you coughing up any blood? If so ask: How much? (flecks, streaks, tablespoons, etc.)     No 5. DIFFICULTY BREATHING: Are you having difficulty breathing? If Yes, ask: How bad is it? (e.g., mild, moderate, severe)    - MILD: No SOB at rest, mild SOB with walking, speaks normally in sentences, can lie down, no retractions, pulse < 100.    - MODERATE: SOB at rest, SOB with minimal exertion and prefers to sit, cannot lie down flat, speaks in phrases, mild retractions, audible wheezing, pulse 100-120.    - SEVERE: Very SOB at rest, speaks in single words, struggling to breathe, sitting hunched forward, retractions, pulse > 120      Mild to moderate  6. FEVER: Do you have a fever? If Yes, ask: What is your temperature, how was it measured, and when did it start?     No 7. CARDIAC HISTORY: Do you have any history of heart disease? (e.g., heart attack, congestive heart failure)      NO  8. LUNG HISTORY: Do you have any history of lung disease?  (e.g., pulmonary embolus, asthma, emphysema)     Yes 9. PE RISK FACTORS: Do you have a history of blood clots?  (or: recent major surgery, recent prolonged travel, bedridden)     No 10. OTHER SYMPTOMS: Do you have any other symptoms? (e.g., runny nose, wheezing, chest pain)       Body aches, I feel run down  PCP visit on 6/23  Protocols used: Cough - Acute Non-Productive-A-AH   FYI Only or Action Required?: FYI only for provider  Patient is followed in Pulmonology for COPD, last seen on 10/21/2023 by Lind Repine, MD. Called Nurse Triage reporting Cough. Symptoms began several weeks ago. Interventions attempted: Other: Prescribed Prednisone  yesterday, has not picked up from pharmacy yet. Symptoms are: unchanged.  Triage Disposition: Home Care  Patient/caregiver understands and will follow disposition?: Yes

## 2024-03-31 ENCOUNTER — Ambulatory Visit

## 2024-04-06 ENCOUNTER — Ambulatory Visit: Payer: Self-pay | Admitting: Internal Medicine

## 2024-04-06 ENCOUNTER — Encounter: Payer: Self-pay | Admitting: Internal Medicine

## 2024-04-06 ENCOUNTER — Telehealth (INDEPENDENT_AMBULATORY_CARE_PROVIDER_SITE_OTHER): Admitting: Internal Medicine

## 2024-04-06 DIAGNOSIS — M797 Fibromyalgia: Secondary | ICD-10-CM

## 2024-04-06 DIAGNOSIS — J449 Chronic obstructive pulmonary disease, unspecified: Secondary | ICD-10-CM | POA: Diagnosis not present

## 2024-04-06 DIAGNOSIS — E1169 Type 2 diabetes mellitus with other specified complication: Secondary | ICD-10-CM

## 2024-04-06 DIAGNOSIS — M2550 Pain in unspecified joint: Secondary | ICD-10-CM

## 2024-04-06 DIAGNOSIS — I10 Essential (primary) hypertension: Secondary | ICD-10-CM | POA: Diagnosis not present

## 2024-04-06 DIAGNOSIS — Z7984 Long term (current) use of oral hypoglycemic drugs: Secondary | ICD-10-CM

## 2024-04-06 MED ORDER — CYCLOBENZAPRINE HCL 10 MG PO TABS: 10.0000 mg | ORAL_TABLET | Freq: Every day | ORAL | 3 refills | Status: DC

## 2024-04-06 MED ORDER — AZITHROMYCIN 250 MG PO TABS: ORAL_TABLET | ORAL | 0 refills | Status: AC

## 2024-04-06 NOTE — Assessment & Plan Note (Signed)
 BP Readings from Last 1 Encounters:  01/13/24 (!) 100/49   Well-controlled with olmesartan  20 mg QD Counseled for compliance with the medications Advised DASH diet and moderate exercise/walking, at least 150 mins/week

## 2024-04-06 NOTE — Patient Instructions (Addendum)
 Please start taking Flexeril  10 mg at bedtime.  Please start taking azithromycin  as prescribed.  Please continue to take medications as prescribed.  Please continue to follow low carb diet and perform moderate exercise/walking at least 150 mins/week.  Please get fasting blood tests done within a week.

## 2024-04-06 NOTE — Assessment & Plan Note (Signed)
 Uncontrolled, likely due to poor sleep quality Currently takes Cymbalta  60 mg twice daily Tried Elavil  10 mg at bedtime, but she could not tolerate it NP tried to prescribe Lyrica  75 mg twice daily, but had excessive nausea and tiredness with it, can try a lower dose later Increased dose of Flexeril  to 10 mg nightly She prefers to take oral steroid, had lengthy discussion to avoid prolonged use of oral steroids due to its systemic side effects

## 2024-04-06 NOTE — Progress Notes (Signed)
 Virtual Visit via Video Note   Because of Jillian [name]'s co-morbid illnesses, she is at least at moderate risk for complications without adequate follow up.  This format is felt to be most appropriate for this patient at this time.  All issues noted in this document were discussed and addressed.  A limited physical exam was performed with this format.      Evaluation Performed:  Follow-up visit  Date:  04/06/2024   ID:  Jillian [name], DOB 31-Aug-1966, MRN [id number]  Patient Location: Home Provider Location: Office/Clinic  Participants: Patient Location of Patient: Home Location of Provider: Telehealth Consent was obtain for visit to be over via telehealth. I verified that I am speaking with the correct person using two identifiers.  PCP:  Tobie Suzzane POUR, MD   Chief Complaint: Follow-up of her chronic medical conditions  History of Present Illness:    Jillian [name] is a 58 y.o. female with PMH of HTN, COPD, OSA, type II DM, GERD and tobacco abuse who has a video visit for follow-up of her chronic medical conditions.  HTN: Her BP is well controlled currently.  She takes olmesartan  20 mg QD currently.  Denies any headache, dizziness or chest pain currently.   Type II DM: Her HbA1c was 7.4 in 03/25. She takes metformin  500 mg twice daily. She denies polyuria or polyphagia.  Has chronic fatigue. She has been placed on oral prednisone  recently due to COPD exacerbation.   OSA: She has chronic fatigue and insomnia.  She has CPAP, but does not tolerate the mask.  Followed by Dr. Jude currently.  She takes trazodone  as needed for insomnia.   COPD: She complains of recent worsening of cough and dyspnea, was given oral prednisone  for 10 days by pulmonology.  She has noticed mild improvement, but still has dry cough, nasal congestion and postnasal drip.  She uses Trelegy regularly and albuterol  inhaler as needed for dyspnea or wheezing.  She had responded well to ProAir , but had to use  Ventolin  recently due to insurance formulary, but does not get adequate relief with Ventolin .  She has albuterol  nebulizer as well.  Denies any recent worsening of dyspnea or wheezing currently.  She still smokes 2 pack/day, but reports that she lights it, but does not smoke whole cigarette.  She was given Wellbutrin  recently for tobacco cessation, but did not tolerate it.  Fibromyalgia: She takes Cymbalta  60 mg twice daily currently.  She has diffuse muscle aches, around neck, low back, upper and lower extremities.  She has poor sleep quality, has difficulty maintaining sleep.  She takes trazodone  for insomnia.  She did not tolerate Elavil  and Lyrica .  She was recently given Flexeril  5 mg, but she stopped taking it as she did not feel any different.  Denies anhedonia, SI or HI.  GERD: She takes omeprazole  40 mg twice daily, followed by GI.  Denies nausea, vomiting, dysphagia or odynophagia currently.  The patient does not have symptoms concerning for COVID-19 infection (fever, chills, cough, or new shortness of breath).   Past Medical, Surgical, Social History, Allergies, and Medications have been Reviewed.  Past Medical History:  Diagnosis Date   Anxiety    Arthritis    Asthma    COPD (chronic obstructive pulmonary disease) (HCC)    Depression    Diabetes mellitus without complication (HCC)    High blood pressure    History of bladder problems    Migraines    Sleep apnea  Past Surgical History:  Procedure Laterality Date   APPENDECTOMY     BALLOON DILATION N/A 01/13/2024   Procedure: BALLOON DILATION;  Surgeon: Cindie Carlin POUR, DO;  [facility];  Service: Endoscopy;  Laterality: N/A;   BIOPSY  08/12/2023   Procedure: BIOPSY;  Surgeon: Cindie Carlin POUR, DO;  [facility];  Service: Endoscopy;;   BLADDER SURGERY     x3   COLONOSCOPY  06/2018   Dr. Saintclair: Normal terminal ileum with normal biopsies.  Evidence of prior end to side ileocolonic anastomosis in  the cecum.  This was patent and was characterized by healthy-appearing mucosa.  Multiple small and large mouth diverticula noted in the sigmoid colon and descending colon.  Random colon biopsies were benign.  Recommended colonoscopy in 10 years.   ESOPHAGOGASTRODUODENOSCOPY (EGD) WITH PROPOFOL  N/A 08/12/2023   Procedure: ESOPHAGOGASTRODUODENOSCOPY (EGD) WITH PROPOFOL ;  Surgeon: Cindie Carlin POUR, DO;  [facility];  Service: Endoscopy;  Laterality: N/A;  245pm, asa 3   ESOPHAGOGASTRODUODENOSCOPY (EGD) WITH PROPOFOL  N/A 01/13/2024   Procedure: ESOPHAGOGASTRODUODENOSCOPY (EGD) WITH PROPOFOL ;  Surgeon: Cindie Carlin POUR, DO;  [facility];  Service: Endoscopy;  Laterality: N/A;  1015am, asa 3   ileum resection  1982   LAPAROSCOPIC CHOLECYSTECTOMY     PARTIAL HYSTERECTOMY       Current Meds  Medication Sig   azithromycin  (ZITHROMAX ) 250 MG tablet Take 2 tablets on day 1, then 1 tablet daily on days 2 through 5     Allergies:   Doxycycline and Banana   ROS:   Please see the history of present illness. All other systems reviewed and are negative.   Labs/Other Tests and Data Reviewed:    Recent Labs: 01/03/2024: ALT 44; BUN 23; Creatinine, Ser 1.39; Hemoglobin 14.2; Platelets 292; Potassium 5.1; Sodium 139; TSH 1.900   Recent Lipid Panel Lab Results  Component Value Date/Time   CHOL 92 (L) 01/03/2024 09:23 AM   TRIG 204 (H) 01/03/2024 09:23 AM   HDL 39 (L) 01/03/2024 09:23 AM   CHOLHDL 2.4 01/03/2024 09:23 AM   LDLCALC 21 01/03/2024 09:23 AM    Wt Readings from Last 3 Encounters:  01/13/24 170 lb 10.2 oz (77.4 kg)  01/09/24 170 lb 12.8 oz (77.5 kg)  01/03/24 170 lb 12.8 oz (77.5 kg)     Objective:    Vital Signs:  There were no vitals taken for this visit.   VITAL SIGNS:  reviewed GEN:  no acute distress EYES:  sclerae anicteric, EOMI - Extraocular Movements Intact RESPIRATORY:  normal respiratory effort, symmetric expansion NEURO:  alert and oriented x  3, no obvious focal deficit PSYCH:  normal affect  ASSESSMENT & PLAN:    Essential hypertension BP Readings from Last 1 Encounters:  01/13/24 (!) 100/49   Well-controlled with olmesartan  20 mg QD Counseled for compliance with the medications Advised DASH diet and moderate exercise/walking, at least 150 mins/week   Type 2 diabetes mellitus with other specified complication (HCC) Lab Results  Component Value Date   HGBA1C 7.4 (H) 01/03/2024   Well-controlled now Associated with HTN and HLD On Metformin  500 mg twice daily Advised to follow diabetic diet On ARB and statin F/u CMP and lipid panel Diabetic eye exam: Advised to follow up with Ophthalmology for diabetic eye exam  COPD (chronic obstructive pulmonary disease) (HCC) Has recent worsening of cough and dyspnea, likely due to COPD exacerbation Recently completed oral prednisone  for 10 days, would avoid recurrent use of  oral prednisone  Started oral azithromycin  Overall well controlled with Trelegy and as needed albuterol  Refilled albuterol  neb Needs to cut down -> quit smoking Followed by pulmonology  Fibromyalgia Uncontrolled, likely due to poor sleep quality Currently takes Cymbalta  60 mg twice daily Tried Elavil  10 mg at bedtime, but she could not tolerate it NP tried to prescribe Lyrica  75 mg twice daily, but had excessive nausea and tiredness with it, can try a lower dose later Increased dose of Flexeril  to 10 mg nightly She prefers to take oral steroid, had lengthy discussion to avoid prolonged use of oral steroids due to its systemic side effects  Polyarthralgia Has diffuse muscle aches and arthralgias, including bilateral shoulder, chest, low back and hips Checked ANA from chart - was WNL Check rheumatoid factor and CRP    I discussed the assessment and treatment plan with the patient. The patient was provided an opportunity to ask questions, and all were answered. The patient agreed with the plan and  demonstrated an understanding of the instructions.   The patient was advised to call back or seek an in-person evaluation if the symptoms worsen or if the condition fails to improve as anticipated.  The above assessment and management plan was discussed with the patient. The patient verbalized understanding of and has agreed to the management plan.   Medication Adjustments/Labs and Tests Ordered: Current medicines are reviewed at length with the patient today.  Concerns regarding medicines are outlined above.   Tests Ordered: Orders Placed This Encounter  Procedures   CBC with Differential/Platelet   CMP14+EGFR   Hemoglobin A1c   C-reactive protein   Rheumatoid Factor    Medication Changes: Meds ordered this encounter  Medications   cyclobenzaprine  (FLEXERIL ) 10 MG tablet    Sig: Take 1 tablet (10 mg total) by mouth at bedtime.    Dispense:  30 tablet    Refill:  3   azithromycin  (ZITHROMAX ) 250 MG tablet    Sig: Take 2 tablets on day 1, then 1 tablet daily on days 2 through 5    Dispense:  6 tablet    Refill:  0     Note: This dictation was prepared with Dragon dictation along with smaller phrase technology. Similar sounding words can be transcribed inadequately or may not be corrected upon review. Any transcriptional errors that result from this process are unintentional.      Disposition:  Follow up  Signed, Suzzane MARLA Blanch, MD  04/06/2024 10:59 AM     [facility]

## 2024-04-06 NOTE — Assessment & Plan Note (Signed)
 Has diffuse muscle aches and arthralgias, including bilateral shoulder, chest, low back and hips Checked ANA from chart - was WNL Check rheumatoid factor and CRP

## 2024-04-06 NOTE — Assessment & Plan Note (Signed)
 Has recent worsening of cough and dyspnea, likely due to COPD exacerbation Recently completed oral prednisone  for 10 days, would avoid recurrent use of oral prednisone  Started oral azithromycin  Overall well controlled with Trelegy and as needed albuterol  Refilled albuterol  neb Needs to cut down -> quit smoking Followed by pulmonology

## 2024-04-06 NOTE — Telephone Encounter (Signed)
 Patient converted to video

## 2024-04-06 NOTE — Assessment & Plan Note (Signed)
 Lab Results  Component Value Date   HGBA1C 7.4 (H) 01/03/2024   Well-controlled now Associated with HTN and HLD On Metformin  500 mg twice daily Advised to follow diabetic diet On ARB and statin F/u CMP and lipid panel Diabetic eye exam: Advised to follow up with Ophthalmology for diabetic eye exam

## 2024-04-09 ENCOUNTER — Ambulatory Visit

## 2024-04-09 ENCOUNTER — Encounter: Payer: Self-pay | Admitting: Internal Medicine

## 2024-04-16 ENCOUNTER — Ambulatory Visit

## 2024-04-16 DIAGNOSIS — E119 Type 2 diabetes mellitus without complications: Secondary | ICD-10-CM

## 2024-04-16 DIAGNOSIS — E1169 Type 2 diabetes mellitus with other specified complication: Secondary | ICD-10-CM

## 2024-04-16 LAB — HM DIABETES EYE EXAM

## 2024-04-16 NOTE — Progress Notes (Signed)
 Arrived 04/16/2024 and has given verbal consent to obtain images and complete their overdue diabetic retinal screening.  The images have been sent to an ophthalmologist or optometrist for review and interpretation.  Results will be sent back to [facility] for review.  Patient has been informed they will be contacted when we receive the results via telephone or MyChart.

## 2024-04-17 LAB — CMP14+EGFR
ALT: 16 IU/L (ref 0–32)
AST: 17 IU/L (ref 0–40)
Albumin: 4.1 g/dL (ref 3.8–4.9)
Alkaline Phosphatase: 106 IU/L (ref 44–121)
BUN/Creatinine Ratio: 10 — ABNORMAL LOW (ref 12–28)
BUN: 15 mg/dL (ref 8–27)
Bilirubin Total: <0.2 mg/dL (ref 0.0–1.2)
CO2: 19 mmol/L — ABNORMAL LOW (ref 20–29)
Calcium: 9.5 mg/dL (ref 8.7–10.3)
Chloride: 104 mmol/L (ref 96–106)
Creatinine, Ser: 1.55 mg/dL — ABNORMAL HIGH (ref 0.57–1.00)
Globulin, Total: 2.1 g/dL (ref 1.5–4.5)
Glucose: 163 mg/dL — ABNORMAL HIGH (ref 70–99)
Potassium: 5.4 mmol/L — ABNORMAL HIGH (ref 3.5–5.2)
Sodium: 140 mmol/L (ref 134–144)
Total Protein: 6.2 g/dL (ref 6.0–8.5)
eGFR: 38 mL/min/1.73 — ABNORMAL LOW (ref >59)

## 2024-04-17 LAB — HEMOGLOBIN A1C
Est. average glucose Bld gHb Est-mCnc: 148 mg/dL
Hgb A1c MFr Bld: 6.8 % — ABNORMAL HIGH (ref 4.8–5.6)

## 2024-04-17 LAB — CBC WITH DIFFERENTIAL/PLATELET
Basophils Absolute: 0.1 x10E3/uL (ref 0.0–0.2)
Basos: 1 %
EOS (ABSOLUTE): 1.2 x10E3/uL — ABNORMAL HIGH (ref 0.0–0.4)
Eos: 12 %
Hematocrit: 45 % (ref 34.0–46.6)
Hemoglobin: 13.5 g/dL (ref 11.1–15.9)
Immature Grans (Abs): 0 x10E3/uL (ref 0.0–0.1)
Immature Granulocytes: 0 %
Lymphocytes Absolute: 2.1 x10E3/uL (ref 0.7–3.1)
Lymphs: 21 %
MCH: 26.7 pg (ref 26.6–33.0)
MCHC: 30 g/dL — ABNORMAL LOW (ref 31.5–35.7)
MCV: 89 fL (ref 79–97)
Monocytes Absolute: 0.8 x10E3/uL (ref 0.1–0.9)
Monocytes: 8 %
Neutrophils Absolute: 5.9 x10E3/uL (ref 1.4–7.0)
Neutrophils: 58 %
Platelets: 281 x10E3/uL (ref 150–450)
RBC: 5.06 x10E6/uL (ref 3.77–5.28)
RDW: 15.5 % — ABNORMAL HIGH (ref 11.7–15.4)
WBC: 9.9 x10E3/uL (ref 3.4–10.8)

## 2024-04-17 LAB — C-REACTIVE PROTEIN: CRP: <1 mg/L (ref 0–10)

## 2024-04-17 LAB — RHEUMATOID FACTOR: Rheumatoid fact SerPl-aCnc: <10 [IU]/mL (ref <14.0)

## 2024-04-19 NOTE — Progress Notes (Deleted)
 Referring Provider: Tobie Suzzane POUR, MD Primary Care Physician:  Tobie Suzzane POUR, MD Primary GI Physician: Dr. PIERRETTE Johns chief complaint on file.   HPI:   Jillian [name] is a 58 y.o. female presenting today for follow-up,   Initially seen 07/24/2023 as a new patient for significant epigastric pain that was constant as well as poor appetite.  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  SMA and IMA patent.   Follows with vascular surgery.   EGD 08/12/2023 with significant gastritis, superficial ulcerations, one 8 mm cratered gastric ulcer, duodenal stricture dilated with endoscope.  Biopsies negative for H. pylori.   Placed on omeprazole  twice daily as well as Carafate  as needed.  History of NSAID use with Goody powders 4-5 times a day, for diffuse bodyaches, fibromyalgia.   Last seen via virtual visit 10/30/2023.  Reported feeling much better overall, abdominal pain and bloating improved/resolved.  Continue to have occasional breakthrough reflux primarily at night.'s improvement with decreasing size of evening meal and avoiding certain food triggers. She was scheduled for surveillance EGD  EGD 01/13/2024: Widely patent Schatzki's ring in distal esophagus, gastritis biopsied, nonbleeding 6mm gastric ulcer with clean base biopsy, acquired duodenal stenosis dilated.  Recommended PPI twice daily, no NSAIDs.  Pathology with mild chronic inactive gastritis, no H. pylori, ulcer biopsy with chemical/reactive/reparative change.   Today:       Last colonoscopy 07/09/2018: Normal TI s/p biopsy, prior into side ileocolonic anastomosis in the cecum characterized by healthy mucosa, multiple small largemouth diverticula in the sigmoid and descending  colon.  Biopsies taken to evaluate for microscopic colitis.  Pathology was benign.   Past Medical History:  Diagnosis Date   Anxiety    Arthritis    Asthma    COPD (chronic obstructive pulmonary disease) (HCC)    Depression    Diabetes mellitus without complication (HCC)    High blood pressure    History of bladder problems    Migraines    Sleep apnea     Past Surgical History:  Procedure Laterality Date   APPENDECTOMY     BALLOON DILATION N/A 01/13/2024   Procedure: BALLOON DILATION;  Surgeon: Cindie Carlin POUR, DO;  [facility];  Service: Endoscopy;  Laterality: N/A;   BIOPSY  08/12/2023   Procedure: BIOPSY;  Surgeon: Cindie Carlin POUR, DO;  [facility];  Service: Endoscopy;;   BLADDER SURGERY     x3   COLONOSCOPY  06/2018   Dr. Saintclair: Normal terminal ileum with normal biopsies.  Evidence of prior end to side ileocolonic anastomosis in the cecum.  This was patent and was characterized by healthy-appearing mucosa.  Multiple small and large mouth diverticula noted in the sigmoid colon and descending colon.  Random colon biopsies were benign.  Recommended colonoscopy in 10 years.   ESOPHAGOGASTRODUODENOSCOPY (EGD) WITH PROPOFOL  N/A 08/12/2023   Procedure: ESOPHAGOGASTRODUODENOSCOPY (EGD) WITH PROPOFOL ;  Surgeon: Cindie Carlin POUR, DO;  [facility];  Service: Endoscopy;  Laterality: N/A;  245pm, asa 3   ESOPHAGOGASTRODUODENOSCOPY (EGD) WITH PROPOFOL  N/A 01/13/2024   Procedure: ESOPHAGOGASTRODUODENOSCOPY (EGD) WITH PROPOFOL ;  Surgeon: Cindie Carlin POUR, DO;  [facility];  Service: Endoscopy;  Laterality: N/A;  1015am, asa 3   ileum  resection  1982   LAPAROSCOPIC CHOLECYSTECTOMY     PARTIAL HYSTERECTOMY      Current Outpatient Medications  Medication Sig Dispense Refill   albuterol  (PROVENTIL ) (2.5 MG/3ML) 0.083% nebulizer solution Take 3 mLs (2.5 mg total) by nebulization every 6 (six) hours as needed for wheezing or shortness of  breath. 360 mL 3   albuterol  (VENTOLIN  HFA) 108 (90 Base) MCG/ACT inhaler Inhale 2 puffs into the lungs every 6 (six) hours as needed for wheezing or shortness of breath. 54 g 0   Aspirin-Acetaminophen -Caffeine (GOODY HEADACHE PO) Take 1 tablet by mouth as needed.     cyclobenzaprine  (FLEXERIL ) 10 MG tablet Take 1 tablet (10 mg total) by mouth at bedtime. 30 tablet 3   DULoxetine  (CYMBALTA ) 60 MG capsule Take 1 capsule (60 mg total) by mouth 2 (two) times daily. 180 capsule 1   famotidine  (PEPCID ) 20 MG tablet Take 1 tablet (20 mg total) by mouth daily. 90 tablet 3   fluticasone  (FLONASE ) 50 MCG/ACT nasal spray Place 1 spray into both nostrils daily. 16 g 2   Fluticasone -Umeclidin-Vilant (TRELEGY ELLIPTA ) 100-62.5-25 MCG/ACT AEPB Inhale 1 puff into the lungs daily in the afternoon. 60 each 0   Magnesium 200 MG TABS Take 200 mg by mouth daily at 12 noon.     metFORMIN  (GLUCOPHAGE ) 500 MG tablet Take 1 tablet (500 mg total) by mouth 2 (two) times daily with a meal. 180 tablet 1   olmesartan  (BENICAR ) 20 MG tablet Take 1 tablet (20 mg total) by mouth daily. 90 tablet 1   omeprazole  (PRILOSEC) 40 MG capsule Take 1 capsule (40 mg total) by mouth 2 (two) times daily before a meal. 60 capsule 11   rosuvastatin  (CRESTOR ) 20 MG tablet Take 1 tablet (20 mg total) by mouth daily. 90 tablet 1   sucralfate  (CARAFATE ) 1 g tablet Take 1 tablet (1 g total) by mouth 4 (four) times daily. 120 tablet 1   traZODone  (DESYREL ) 50 MG tablet Take 1 tablet (50 mg total) by mouth at bedtime as needed for sleep. 90 tablet 1   vitamin B-12 (CYANOCOBALAMIN) 1000 MCG tablet Take 1,000 mcg by mouth daily.     No current facility-administered medications for this visit.    Allergies as of 04/22/2024 - Review Complete 04/06/2024  Allergen Reaction Noted   Doxycycline Photosensitivity 08/23/2016   Banana Hives 07/10/2019    Family History  Problem Relation Age of Onset   Diabetes Mother    COPD Mother    Heart attack  Father    Diabetes Sister    Colon cancer Neg Hx    Celiac disease Neg Hx    Inflammatory bowel disease Neg Hx     Social History   Socioeconomic History   Marital status: Divorced    Spouse name: Not on file   Number of children: 1   Years of education: Not on file   Highest education level: Some college, no degree  Occupational History   Occupation: Comptroller: [employer]    Comment: currently on short term disability  Tobacco Use   Smoking status: Every Day    Current packs/day: 1.50    Average packs/day: 1.5 packs/day for 43.0 years (64.5 ttl pk-yrs)    Types: Cigarettes   Smokeless tobacco: Never   Tobacco comments:    2 ppd as of 01/03/24  Vaping Use   Vaping status: Never Used  Substance and Sexual Activity   Alcohol use: Yes  Comment: hardly ever   Drug use: No   Sexual activity: Not on file  Other Topics Concern   Not on file  Social History Narrative   Not on file   Social Drivers of Health   Financial Resource Strain: Medium Risk (04/06/2024)   Overall Financial Resource Strain (CARDIA)    Difficulty of Paying Living Expenses: Somewhat hard  Food Insecurity: Food Insecurity Present (04/06/2024)   Hunger Vital Sign    Worried About Running Out of Food in the Last Year: Sometimes true    Ran Out of Food in the Last Year: Sometimes true  Transportation Needs: Unmet Transportation Needs (04/06/2024)   PRAPARE - Transportation    Lack of Transportation (Medical): Yes    Lack of Transportation (Non-Medical): Yes  Physical Activity: Inactive (04/06/2024)   Exercise Vital Sign    Days of Exercise per Week: 0 days    Minutes of Exercise per Session: Not on file  Stress: Stress Concern Present (04/06/2024)   [employer] of Occupational Health - Occupational Stress Questionnaire    Feeling of Stress: Very much  Social Connections: Socially Isolated (04/06/2024)   Social Connection and Isolation Panel    Frequency of Communication with  Friends and Family: Once a week    Frequency of Social Gatherings with Friends and Family: Never    Attends Religious Services: Never    Database administrator or Organizations: No    Attends Engineer, structural: Not on file    Marital Status: Divorced    Review of Systems: Gen: Denies fever, chills, anorexia. Denies fatigue, weakness, weight loss.  CV: Denies chest pain, palpitations, syncope, peripheral edema, and claudication. Resp: Denies dyspnea at rest, cough, wheezing, coughing up blood, and pleurisy. GI: Denies vomiting blood, jaundice, and fecal incontinence.   Denies dysphagia or odynophagia. Derm: Denies rash, itching, dry skin Psych: Denies depression, anxiety, memory loss, confusion. No homicidal or suicidal ideation.  Heme: Denies bruising, bleeding, and enlarged lymph nodes.  Physical Exam: There were no vitals taken for this visit. General:   Alert and oriented. No distress noted. Pleasant and cooperative.  Head:  Normocephalic and atraumatic. Eyes:  Conjuctiva clear without scleral icterus. Heart:  S1, S2 present without murmurs appreciated. Lungs:  Clear to auscultation bilaterally. No wheezes, rales, or rhonchi. No distress.  Abdomen:  +BS, soft, non-tender and non-distended. No rebound or guarding. No HSM or masses noted. Msk:  Symmetrical without gross deformities. Normal posture. Extremities:  Without edema. Neurologic:  Alert and  oriented x4 Psych:  Normal mood and affect.    Assessment:     Plan:  ***   Josette Centers, PA-C [facility] 04/22/2024

## 2024-04-20 ENCOUNTER — Ambulatory Visit: Payer: Self-pay | Admitting: Internal Medicine

## 2024-04-22 ENCOUNTER — Ambulatory Visit: Admitting: Gastroenterology

## 2024-04-28 ENCOUNTER — Ambulatory Visit: Payer: Self-pay | Admitting: Internal Medicine

## 2024-04-29 ENCOUNTER — Ambulatory Visit (HOSPITAL_COMMUNITY)
Admission: RE | Admit: 2024-04-29 | Discharge: 2024-04-29 | Disposition: A | Discharge status: Home / Self Care | Source: Ambulatory Visit | Attending: Internal Medicine | Admitting: Internal Medicine

## 2024-04-29 DIAGNOSIS — Z87891 Personal history of nicotine dependence: Secondary | ICD-10-CM | POA: Insufficient documentation

## 2024-04-29 DIAGNOSIS — F17210 Nicotine dependence, cigarettes, uncomplicated: Secondary | ICD-10-CM | POA: Diagnosis present

## 2024-04-29 DIAGNOSIS — Z122 Encounter for screening for malignant neoplasm of respiratory organs: Secondary | ICD-10-CM | POA: Insufficient documentation

## 2024-04-30 ENCOUNTER — Encounter: Payer: Self-pay | Admitting: Internal Medicine

## 2024-04-30 NOTE — Telephone Encounter (Signed)
 Scheduled 07.18 at 9:40 am

## 2024-05-01 ENCOUNTER — Encounter: Payer: Self-pay | Admitting: Internal Medicine

## 2024-05-01 ENCOUNTER — Telehealth: Admitting: Internal Medicine

## 2024-05-01 VITALS — Ht 62.0 in | Wt 170.0 lb

## 2024-05-01 DIAGNOSIS — J449 Chronic obstructive pulmonary disease, unspecified: Secondary | ICD-10-CM

## 2024-05-01 DIAGNOSIS — J0111 Acute recurrent frontal sinusitis: Secondary | ICD-10-CM

## 2024-05-01 MED ORDER — BENZONATATE 200 MG PO CAPS: 200.0000 mg | ORAL_CAPSULE | Freq: Two times a day (BID) | ORAL | 0 refills | Status: DC | PRN

## 2024-05-01 MED ORDER — METHYLPREDNISOLONE 4 MG PO TBPK: ORAL_TABLET | ORAL | 0 refills | Status: DC

## 2024-05-01 MED ORDER — AMOXICILLIN-POT CLAVULANATE 875-125 MG PO TABS
1.0000 | ORAL_TABLET | Freq: Two times a day (BID) | ORAL | 0 refills | Status: DC
Start: 2024-05-01 — End: 2024-05-20

## 2024-05-01 NOTE — Assessment & Plan Note (Signed)
 Started empiric Augmentin  considering persistent symptoms despite symptomatic treatment Continue Flonase  for nasal congestion Tessalon as needed for cough

## 2024-05-01 NOTE — Patient Instructions (Addendum)
 Please start taking Augmentin  as prescribed.  Please start taking prednisone  as prescribed.  Please continue to use Flonase  for nasal congestion/allergies.  Please use Tessalon as needed for cough.  Please continue taking Mucinex as needed for cough as well.

## 2024-05-01 NOTE — Progress Notes (Signed)
 Virtual Visit via Video Note   Because of Jillian [name]'s co-morbid illnesses, she is at least at moderate risk for complications without adequate follow up.  This format is felt to be most appropriate for this patient at this time.  All issues noted in this document were discussed and addressed.  A limited physical exam was performed with this format.      Evaluation Performed:  Follow-up visit  Date:  05/01/2024   ID:  Jillian [name], DOB 06-25-1966, MRN [id number]  Patient Location: Home Provider Location: Office/Clinic  Participants: Patient Location of Patient: Home Location of Provider: Telehealth Consent was obtain for visit to be over via telehealth. I verified that I am speaking with the correct person using two identifiers.  PCP:  Tobie Suzzane POUR, MD   Chief Complaint: Nasal congestion  History of Present Illness:    Jillian [name] is a 58 y.o. female who has a video visit for complaint of recent worsening of nasal congestion, postnasal drip and sinus pressure related headache for the last 1 week.  She also reports recent worsening of dyspnea and dry cough.  She has tried using Flonase  for nasal congestion without much relief.  She has been using Trelegy and as needed albuterol  for her COPD.  Denies any fever or chills.  The patient does not have symptoms concerning for COVID-19 infection (fever, chills, cough, or new shortness of breath).   Past Medical, Surgical, Social History, Allergies, and Medications have been Reviewed.  Past Medical History:  Diagnosis Date   Anxiety    Arthritis    Asthma    COPD (chronic obstructive pulmonary disease) (HCC)    Depression    Diabetes mellitus without complication (HCC)    High blood pressure    History of bladder problems    Migraines    Sleep apnea    Past Surgical History:  Procedure Laterality Date   APPENDECTOMY     BALLOON DILATION N/A 01/13/2024   Procedure: BALLOON DILATION;  Surgeon: Cindie Carlin POUR, DO;   Location: [facility];  Service: Endoscopy;  Laterality: N/A;   BIOPSY  08/12/2023   Procedure: BIOPSY;  Surgeon: Cindie Carlin POUR, DO;  Location: [facility];  Service: Endoscopy;;   BLADDER SURGERY     x3   COLONOSCOPY  06/2018   Dr. Saintclair: Normal terminal ileum with normal biopsies.  Evidence of prior end to side ileocolonic anastomosis in the cecum.  This was patent and was characterized by healthy-appearing mucosa.  Multiple small and large mouth diverticula noted in the sigmoid colon and descending colon.  Random colon biopsies were benign.  Recommended colonoscopy in 10 years.   ESOPHAGOGASTRODUODENOSCOPY (EGD) WITH PROPOFOL  N/A 08/12/2023   Procedure: ESOPHAGOGASTRODUODENOSCOPY (EGD) WITH PROPOFOL ;  Surgeon: Cindie Carlin POUR, DO;  Location: [facility];  Service: Endoscopy;  Laterality: N/A;  245pm, asa 3   ESOPHAGOGASTRODUODENOSCOPY (EGD) WITH PROPOFOL  N/A 01/13/2024   Procedure: ESOPHAGOGASTRODUODENOSCOPY (EGD) WITH PROPOFOL ;  Surgeon: Cindie Carlin POUR, DO;  Location: [facility];  Service: Endoscopy;  Laterality: N/A;  1015am, asa 3   ileum resection  1982   LAPAROSCOPIC CHOLECYSTECTOMY     PARTIAL HYSTERECTOMY       Current Meds  Medication Sig   albuterol  (PROVENTIL ) (2.5 MG/3ML) 0.083% nebulizer solution Take 3 mLs (2.5 mg total) by nebulization every 6 (six) hours as needed for wheezing or shortness of breath.   albuterol  (VENTOLIN  HFA) 108 (90 Base) MCG/ACT inhaler Inhale  2 puffs into the lungs every 6 (six) hours as needed for wheezing or shortness of breath.   amoxicillin -clavulanate (AUGMENTIN ) 875-125 MG tablet Take 1 tablet by mouth 2 (two) times daily.   Aspirin-Acetaminophen -Caffeine (GOODY HEADACHE PO) Take 1 tablet by mouth as needed.   benzonatate (TESSALON) 200 MG capsule Take 1 capsule (200 mg total) by mouth 2 (two) times daily as needed for cough.   cyclobenzaprine  (FLEXERIL ) 10 MG tablet Take 1 tablet (10 mg total) by mouth at bedtime.   DULoxetine   (CYMBALTA ) 60 MG capsule Take 1 capsule (60 mg total) by mouth 2 (two) times daily.   famotidine  (PEPCID ) 20 MG tablet Take 1 tablet (20 mg total) by mouth daily.   fluticasone  (FLONASE ) 50 MCG/ACT nasal spray Place 1 spray into both nostrils daily.   Fluticasone -Umeclidin-Vilant (TRELEGY ELLIPTA ) 100-62.5-25 MCG/ACT AEPB Inhale 1 puff into the lungs daily in the afternoon.   Magnesium 200 MG TABS Take 200 mg by mouth daily at 12 noon.   metFORMIN  (GLUCOPHAGE ) 500 MG tablet Take 1 tablet (500 mg total) by mouth 2 (two) times daily with a meal.   methylPREDNISolone (MEDROL DOSEPAK) 4 MG TBPK tablet Take as package instructions.   olmesartan  (BENICAR ) 20 MG tablet Take 1 tablet (20 mg total) by mouth daily.   omeprazole  (PRILOSEC) 40 MG capsule Take 1 capsule (40 mg total) by mouth 2 (two) times daily before a meal.   rosuvastatin  (CRESTOR ) 20 MG tablet Take 1 tablet (20 mg total) by mouth daily.   sucralfate  (CARAFATE ) 1 g tablet Take 1 tablet (1 g total) by mouth 4 (four) times daily.   traZODone  (DESYREL ) 50 MG tablet Take 1 tablet (50 mg total) by mouth at bedtime as needed for sleep.   vitamin B-12 (CYANOCOBALAMIN) 1000 MCG tablet Take 1,000 mcg by mouth daily.     Allergies:   Doxycycline and Banana   ROS:   Please see the history of present illness. All other systems reviewed and are negative.   Labs/Other Tests and Data Reviewed:    Recent Labs: 01/03/2024: TSH 1.900 04/16/2024: ALT 16; BUN 15; Creatinine, Ser 1.55; Hemoglobin 13.5; Platelets 281; Potassium 5.4; Sodium 140   Recent Lipid Panel Lab Results  Component Value Date/Time   CHOL 92 (L) 01/03/2024 09:23 AM   TRIG 204 (H) 01/03/2024 09:23 AM   HDL 39 (L) 01/03/2024 09:23 AM   CHOLHDL 2.4 01/03/2024 09:23 AM   LDLCALC 21 01/03/2024 09:23 AM    Wt Readings from Last 3 Encounters:  05/01/24 170 lb (77.1 kg)  01/13/24 170 lb 10.2 oz (77.4 kg)  01/09/24 170 lb 12.8 oz (77.5 kg)     Objective:    Vital Signs:  Ht 5'  2 (1.575 m)   Wt 170 lb (77.1 kg)   BMI 31.09 kg/m    VITAL SIGNS:  reviewed GEN:  no acute distress EYES:  sclerae anicteric, EOMI - Extraocular Movements Intact RESPIRATORY:  normal respiratory effort, symmetric expansion NEURO:  alert and oriented x 3, no obvious focal deficit PSYCH:  normal affect  ASSESSMENT & PLAN:    COPD (chronic obstructive pulmonary disease) (HCC) Has recent worsening of cough and dyspnea, likely due to COPD exacerbation Medrol dose pack prescribed Started oral Augmentin  Overall well controlled with Trelegy and as needed albuterol  Refilled albuterol  neb Needs to cut down -> quit smoking Followed by pulmonology  Acute recurrent frontal sinusitis Started empiric Augmentin  considering persistent symptoms despite symptomatic treatment Continue Flonase  for nasal congestion Tessalon as  needed for cough    I discussed the assessment and treatment plan with the patient. The patient was provided an opportunity to ask questions, and all were answered. The patient agreed with the plan and demonstrated an understanding of the instructions.   The patient was advised to call back or seek an in-person evaluation if the symptoms worsen or if the condition fails to improve as anticipated.  The above assessment and management plan was discussed with the patient. The patient verbalized understanding of and has agreed to the management plan.   Medication Adjustments/Labs and Tests Ordered: Current medicines are reviewed at length with the patient today.  Concerns regarding medicines are outlined above.   Tests Ordered: No orders of the defined types were placed in this encounter.   Medication Changes: Meds ordered this encounter  Medications   amoxicillin -clavulanate (AUGMENTIN ) 875-125 MG tablet    Sig: Take 1 tablet by mouth 2 (two) times daily.    Dispense:  14 tablet    Refill:  0   methylPREDNISolone (MEDROL DOSEPAK) 4 MG TBPK tablet    Sig: Take as  package instructions.    Dispense:  1 each    Refill:  0   benzonatate (TESSALON) 200 MG capsule    Sig: Take 1 capsule (200 mg total) by mouth 2 (two) times daily as needed for cough.    Dispense:  20 capsule    Refill:  0     Note: This dictation was prepared with Dragon dictation along with smaller phrase technology. Similar sounding words can be transcribed inadequately or may not be corrected upon review. Any transcriptional errors that result from this process are unintentional.      Disposition:  Follow up  Signed, Suzzane MARLA Blanch, MD  05/01/2024 9:59 AM     [facility]

## 2024-05-01 NOTE — Assessment & Plan Note (Signed)
 Has recent worsening of cough and dyspnea, likely due to COPD exacerbation Medrol dose pack prescribed Started oral Augmentin  Overall well controlled with Trelegy and as needed albuterol  Refilled albuterol  neb Needs to cut down -> quit smoking Followed by pulmonology

## 2024-05-04 ENCOUNTER — Ambulatory Visit

## 2024-05-05 NOTE — Progress Notes (Deleted)
 Referring Provider: Tobie Suzzane POUR, MD Primary Care Physician:  Tobie Suzzane POUR, MD Primary GI Physician: Dr. PIERRETTE Johns chief complaint on file.   HPI:   Jillian [name] is a 58 y.o. female presenting today for follow-up.    Initially seen 07/24/2023 as a new patient for significant epigastric pain that was constant as well as poor appetite.   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  SMA and IMA patent.   Follows with vascular surgery.   EGD 08/12/2023 with significant gastritis, superficial ulcerations, one 8 mm cratered gastric ulcer, duodenal stricture dilated with endoscope.  Biopsies negative for H. pylori.   Placed on omeprazole  twice daily as well as Carafate  as needed.  History of NSAID use with Goody powders 4-5 times a day, for diffuse bodyaches, fibromyalgia.     Last seen via virtual visit 10/30/2023.  Reported feeling much better overall, abdominal pain and bloating improved/resolved.  Continue to have occasional breakthrough reflux primarily at night.'s improvement with decreasing size of evening meal and avoiding certain food triggers. She was scheduled for surveillance EGD   EGD 01/13/2024: Widely patent Schatzki's ring in distal esophagus, gastritis biopsied, nonbleeding 6mm gastric ulcer with clean base biopsy, acquired duodenal stenosis dilated.  Recommended PPI twice daily, no NSAIDs.  Pathology with mild chronic inactive gastritis, no H. pylori, ulcer biopsy with chemical/reactive/reparative change.     Today:            Last colonoscopy 07/09/2018: Normal TI s/p biopsy, prior into side ileocolonic anastomosis in the cecum characterized by healthy mucosa, multiple small largemouth diverticula in the sigmoid and  descending colon.  Biopsies taken to evaluate for microscopic colitis.  Pathology was benign.  Past Medical History:  Diagnosis Date   Anxiety    Arthritis    Asthma    COPD (chronic obstructive pulmonary disease) (HCC)    Depression    Diabetes mellitus without complication (HCC)    High blood pressure    History of bladder problems    Migraines    Sleep apnea     Past Surgical History:  Procedure Laterality Date   APPENDECTOMY     BALLOON DILATION N/A 01/13/2024   Procedure: BALLOON DILATION;  Surgeon: Cindie Carlin POUR, DO;  [facility]: [facility];  Service: Endoscopy;  Laterality: N/A;   BIOPSY  08/12/2023   Procedure: BIOPSY;  Surgeon: Cindie Carlin POUR, DO;  [facility];  Service: Endoscopy;;   BLADDER SURGERY     x3   COLONOSCOPY  06/2018   Dr. Saintclair: Normal terminal ileum with normal biopsies.  Evidence of prior end to side ileocolonic anastomosis in the cecum.  This was patent and was characterized by healthy-appearing mucosa.  Multiple small and large mouth diverticula noted in the sigmoid colon and descending colon.  Random colon biopsies were benign.  Recommended colonoscopy in 10 years.   ESOPHAGOGASTRODUODENOSCOPY (EGD) WITH PROPOFOL  N/A 08/12/2023   Procedure: ESOPHAGOGASTRODUODENOSCOPY (EGD) WITH PROPOFOL ;  Surgeon: Cindie Carlin POUR, DO;  [facility]: [facility];  Service: Endoscopy;  Laterality: N/A;  245pm, asa 3   ESOPHAGOGASTRODUODENOSCOPY (EGD) WITH PROPOFOL  N/A 01/13/2024   Procedure: ESOPHAGOGASTRODUODENOSCOPY (EGD) WITH PROPOFOL ;  Surgeon: Cindie Carlin POUR, DO;  [facility]: [facility];  Service:  Endoscopy;  Laterality: N/A;  1015am, asa 3   ileum resection  1982   LAPAROSCOPIC CHOLECYSTECTOMY     PARTIAL HYSTERECTOMY      Current Outpatient Medications  Medication Sig Dispense Refill   albuterol  (PROVENTIL ) (2.5 MG/3ML) 0.083% nebulizer solution Take 3 mLs (2.5 mg total) by nebulization every 6 (six) hours as needed for wheezing or shortness  of breath. 360 mL 3   albuterol  (VENTOLIN  HFA) 108 (90 Base) MCG/ACT inhaler Inhale 2 puffs into the lungs every 6 (six) hours as needed for wheezing or shortness of breath. 54 g 0   amoxicillin -clavulanate (AUGMENTIN ) 875-125 MG tablet Take 1 tablet by mouth 2 (two) times daily. 14 tablet 0   Aspirin-Acetaminophen -Caffeine (GOODY HEADACHE PO) Take 1 tablet by mouth as needed.     benzonatate  (TESSALON ) 200 MG capsule Take 1 capsule (200 mg total) by mouth 2 (two) times daily as needed for cough. 20 capsule 0   cyclobenzaprine  (FLEXERIL ) 10 MG tablet Take 1 tablet (10 mg total) by mouth at bedtime. 30 tablet 3   DULoxetine  (CYMBALTA ) 60 MG capsule Take 1 capsule (60 mg total) by mouth 2 (two) times daily. 180 capsule 1   famotidine  (PEPCID ) 20 MG tablet Take 1 tablet (20 mg total) by mouth daily. 90 tablet 3   fluticasone  (FLONASE ) 50 MCG/ACT nasal spray Place 1 spray into both nostrils daily. 16 g 2   Fluticasone -Umeclidin-Vilant (TRELEGY ELLIPTA ) 100-62.5-25 MCG/ACT AEPB Inhale 1 puff into the lungs daily in the afternoon. 60 each 0   Magnesium 200 MG TABS Take 200 mg by mouth daily at 12 noon.     metFORMIN  (GLUCOPHAGE ) 500 MG tablet Take 1 tablet (500 mg total) by mouth 2 (two) times daily with a meal. 180 tablet 1   methylPREDNISolone  (MEDROL  DOSEPAK) 4 MG TBPK tablet Take as package instructions. 1 each 0   olmesartan  (BENICAR ) 20 MG tablet Take 1 tablet (20 mg total) by mouth daily. 90 tablet 1   omeprazole  (PRILOSEC) 40 MG capsule Take 1 capsule (40 mg total) by mouth 2 (two) times daily before a meal. 60 capsule 11   rosuvastatin  (CRESTOR ) 20 MG tablet Take 1 tablet (20 mg total) by mouth daily. 90 tablet 1   sucralfate  (CARAFATE ) 1 g tablet Take 1 tablet (1 g total) by mouth 4 (four) times daily. 120 tablet 1   traZODone  (DESYREL ) 50 MG tablet Take 1 tablet (50 mg total) by mouth at bedtime as needed for sleep. 90 tablet 1   vitamin B-12 (CYANOCOBALAMIN) 1000 MCG tablet Take 1,000 mcg by  mouth daily.     No current facility-administered medications for this visit.    Allergies as of 05/06/2024 - Review Complete 05/01/2024  Allergen Reaction Noted   Doxycycline Photosensitivity 08/23/2016   Banana Hives 07/10/2019    Family History  Problem Relation Age of Onset   Diabetes Mother    COPD Mother    Heart attack Father    Diabetes Sister    Colon cancer Neg Hx    Celiac disease Neg Hx    Inflammatory bowel disease Neg Hx     Social History   Socioeconomic History   Marital status: Divorced    Spouse name: Not on file   Number of children: 1   Years of education: Not on file   Highest education level: Some college, no degree  Occupational History   Occupation: Comptroller: [employer]    Comment: currently on short  term disability  Tobacco Use   Smoking status: Every Day    Current packs/day: 1.50    Average packs/day: 1.5 packs/day for 43.0 years (64.5 ttl pk-yrs)    Types: Cigarettes   Smokeless tobacco: Never   Tobacco comments:    2 ppd as of 01/03/24  Vaping Use   Vaping status: Never Used  Substance and Sexual Activity   Alcohol use: Yes    Comment: hardly ever   Drug use: No   Sexual activity: Not on file  Other Topics Concern   Not on file  Social History Narrative   Not on file   Social Drivers of Health   Financial Resource Strain: Medium Risk (04/06/2024)   Overall Financial Resource Strain (CARDIA)    Difficulty of Paying Living Expenses: Somewhat hard  Food Insecurity: Food Insecurity Present (04/06/2024)   Hunger Vital Sign    Worried About Running Out of Food in the Last Year: Sometimes true    Ran Out of Food in the Last Year: Sometimes true  Transportation Needs: Unmet Transportation Needs (04/06/2024)   PRAPARE - Transportation    Lack of Transportation (Medical): Yes    Lack of Transportation (Non-Medical): Yes  Physical Activity: Inactive (04/06/2024)   Exercise Vital Sign    Days of Exercise per Week:  0 days    Minutes of Exercise per Session: Not on file  Stress: Stress Concern Present (04/06/2024)   [employer] of Occupational Health - Occupational Stress Questionnaire    Feeling of Stress: Very much  Social Connections: Socially Isolated (04/06/2024)   Social Connection and Isolation Panel    Frequency of Communication with Friends and Family: Once a week    Frequency of Social Gatherings with Friends and Family: Never    Attends Religious Services: Never    Database administrator or Organizations: No    Attends Engineer, structural: Not on file    Marital Status: Divorced    Review of Systems: Gen: Denies fever, chills, anorexia. Denies fatigue, weakness, weight loss.  CV: Denies chest pain, palpitations, syncope, peripheral edema, and claudication. Resp: Denies dyspnea at rest, cough, wheezing, coughing up blood, and pleurisy. GI: Denies vomiting blood, jaundice, and fecal incontinence.   Denies dysphagia or odynophagia. Derm: Denies rash, itching, dry skin Psych: Denies depression, anxiety, memory loss, confusion. No homicidal or suicidal ideation.  Heme: Denies bruising, bleeding, and enlarged lymph nodes.  Physical Exam: There were no vitals taken for this visit. General:   Alert and oriented. No distress noted. Pleasant and cooperative.  Head:  Normocephalic and atraumatic. Eyes:  Conjuctiva clear without scleral icterus. Heart:  S1, S2 present without murmurs appreciated. Lungs:  Clear to auscultation bilaterally. No wheezes, rales, or rhonchi. No distress.  Abdomen:  +BS, soft, non-tender and non-distended. No rebound or guarding. No HSM or masses noted. Msk:  Symmetrical without gross deformities. Normal posture. Extremities:  Without edema. Neurologic:  Alert and  oriented x4 Psych:  Normal mood and affect.    Assessment:     Plan:  ***   Josette Centers, PA-C [facility] 05/06/2024

## 2024-05-06 ENCOUNTER — Ambulatory Visit: Admitting: Gastroenterology

## 2024-05-10 ENCOUNTER — Encounter: Payer: Self-pay | Admitting: Internal Medicine

## 2024-05-11 ENCOUNTER — Other Ambulatory Visit: Payer: Self-pay

## 2024-05-11 ENCOUNTER — Telehealth: Payer: Self-pay

## 2024-05-11 ENCOUNTER — Other Ambulatory Visit: Payer: Self-pay | Admitting: Family Medicine

## 2024-05-11 DIAGNOSIS — F17210 Nicotine dependence, cigarettes, uncomplicated: Secondary | ICD-10-CM

## 2024-05-11 DIAGNOSIS — B379 Candidiasis, unspecified: Secondary | ICD-10-CM

## 2024-05-11 DIAGNOSIS — R911 Solitary pulmonary nodule: Secondary | ICD-10-CM

## 2024-05-11 DIAGNOSIS — Z122 Encounter for screening for malignant neoplasm of respiratory organs: Secondary | ICD-10-CM

## 2024-05-11 DIAGNOSIS — Z87891 Personal history of nicotine dependence: Secondary | ICD-10-CM

## 2024-05-11 MED ORDER — FLUCONAZOLE 150 MG PO TABS: 150.0000 mg | ORAL_TABLET | Freq: Once | ORAL | 0 refills | Status: AC

## 2024-05-11 NOTE — Telephone Encounter (Signed)
 Pt returned call. Reviewed recent CT results. She is in agreement to complete a 6 month follow up on 11/02/2024 at [facility]. Order placed. Results and plan to PCP. Reminder set. No additional needs.

## 2024-05-11 NOTE — Progress Notes (Unsigned)
 diflu

## 2024-05-11 NOTE — Telephone Encounter (Signed)
 Called patient to review recent Lung CT results. No answer, LVM. New 4.1 mm nodule.   IMPRESSION: Lung-RADS 3, probably benign findings. Short-term follow-up in 6 months is recommended with repeat low-dose chest CT without contrast (please use the following order, CT CHEST LCS NODULE FOLLOW-UP W/O CM).   Mild coronary artery calcification.  Stable airway inflammation.   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 Recommend annual imaging followup by CTA or MRA. This recommendation follows 2010 ACCF/AHA/AATS/ACR/ASA/SCA/SCAI/SIR/STS/SVM Guidelines for the Diagnosis and Management of Patients with Thoracic Aortic Disease. Circulation. 2010; 121: Z733-z630. Aortic aneurysm NOS (ICD10-I71.9) .   Aortic Atherosclerosis (ICD10-I70.0).

## 2024-05-18 ENCOUNTER — Encounter: Payer: Self-pay | Admitting: Internal Medicine

## 2024-05-18 NOTE — Telephone Encounter (Signed)
 Scheduled patient aware

## 2024-05-19 ENCOUNTER — Other Ambulatory Visit: Payer: Self-pay

## 2024-05-19 MED ORDER — FLUTICASONE PROPIONATE 50 MCG/ACT NA SUSP: 1.0000 | Freq: Every day | NASAL | 2 refills | Status: AC

## 2024-05-19 NOTE — Telephone Encounter (Signed)
 Refills sent to pharmacy.

## 2024-05-20 ENCOUNTER — Ambulatory Visit (INDEPENDENT_AMBULATORY_CARE_PROVIDER_SITE_OTHER)

## 2024-05-20 VITALS — BP 125/72 | HR 89 | Ht 61.0 in | Wt 164.1 lb

## 2024-05-20 DIAGNOSIS — L989 Disorder of the skin and subcutaneous tissue, unspecified: Secondary | ICD-10-CM

## 2024-05-20 DIAGNOSIS — Z1231 Encounter for screening mammogram for malignant neoplasm of breast: Secondary | ICD-10-CM

## 2024-05-20 NOTE — Progress Notes (Signed)
 Established Patient Office Visit  Subjective   Patient ID: Jillian [name], female    DOB: 12/09/1965  Age: 58 y.o. MRN: [id number]  Chief Complaint  Patient presents with   Cyst    Patient had a yeast infection , since then has had two knots on the side of the vagina   skin spot    Along panty line    HPI  Patient Active Problem List   Diagnosis Date Noted   Asthmatic bronchitis / GOLD 0 COPD 05/25/2024   Acute recurrent frontal sinusitis 05/01/2024   Polyarthralgia 04/06/2024   Primary insomnia 02/12/2024   Gastroesophageal reflux disease 02/12/2024   Acute bronchitis with COPD (HCC) 01/31/2024   Fibromyalgia 01/03/2024   Type 2 diabetes mellitus with other specified complication (HCC) 01/03/2024   Renal artery stenosis (HCC) 07/31/2023   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/31/2023   Disease of the aorta (HCC) 07/31/2023   Essential hypertension 07/31/2023   Mixed hyperlipidemia 07/31/2023   COPD (chronic obstructive pulmonary disease) (HCC) 02/05/2022   Dyspnea 12/02/2019   Cigarette smoker 12/02/2019   OSA (obstructive sleep apnea) 12/02/2019   Depression with anxiety 12/03/2017   Cervicalgia 08/23/2017   Incontinence 08/22/2017   Carpal tunnel syndrome on both sides 07/29/2017      ROS    Objective:     BP 125/72   Pulse 89   Ht 5' 1 (1.549 m)   Wt 164 lb 1.3 oz (74.4 kg)   SpO2 94%   BMI 31.00 kg/m  BP Readings from Last 3 Encounters:  05/26/24 (!) 115/56  05/20/24 125/72  01/13/24 (!) 100/49   Wt Readings from Last 3 Encounters:  05/26/24 164 lb (74.4 kg)  05/20/24 164 lb 1.3 oz (74.4 kg)  05/01/24 170 lb (77.1 kg)     Physical Exam Vitals and nursing note reviewed.  Constitutional:      Appearance: Normal appearance.  HENT:     Head: Normocephalic.  Eyes:     Extraocular Movements: Extraocular movements intact.     Pupils: Pupils are equal, round, and reactive to light.  Cardiovascular:     Rate and Rhythm: Normal rate and  regular rhythm.  Pulmonary:     Effort: Pulmonary effort is normal.     Breath sounds: Normal breath sounds.  Musculoskeletal:     Cervical back: Normal range of motion and neck supple.  Skin:    Findings: Lesion (raised, brown plaque in right groin, along panty line) present.  Neurological:     Mental Status: She is alert and oriented to person, place, and time.  Psychiatric:        Mood and Affect: Mood normal.        Thought Content: Thought content normal.      No results found for any visits on 05/20/24.    The ASCVD Risk score (Arnett DK, et al., 2019) failed to calculate for the following reasons:   The valid total cholesterol range is 130 to 320 mg/dL    Assessment & Plan:   Problem List Items Addressed This Visit   None Visit Diagnoses       Encounter for screening mammogram for malignant neoplasm of breast    -  Primary   Relevant Orders   MM Digital Screening     Benign skin lesion       Reassured patient this is likely a keratosis, which is a benign plaque. Recommend seeing dermatology given that it is in a place  where frequently irritated.       No follow-ups on file.    Leita Longs, FNP

## 2024-05-24 NOTE — Progress Notes (Deleted)
 Jillian [name], female    DOB: July 11, 1966    MRN: [id number]   Brief patient profile:  58  yo*** *** former Sood Pt with AB pt referred to pulmonary clinic in [place]  05/25/2024 by *** for ***    A1AT 12/09/19 >> 184, MM PFT 01/18/20 >> FEV1 1.43 (56%), Ratio  0.70, TLC 4.34 (89%), DLCO 69%, +BD    History of Present Illness  05/25/2024  Pulmonary/ 1st office eval/ [facility] Josey / [place] [facility]  No chief complaint on file.    Dyspnea:  *** Cough: *** Sleep: *** SABA use: *** 02: *** LDSCT:***  No obvious day to day or daytime pattern/variability or assoc excess/ purulent sputum or mucus plugs or hemoptysis or cp or chest tightness, subjective wheeze or overt sinus or hb symptoms.    Also denies any obvious fluctuation of symptoms with weather or environmental changes or other aggravating or alleviating factors except as outlined above   No unusual exposure hx or h/o childhood pna/ asthma or knowledge of premature birth.  Current Allergies, Complete Past Medical History, Past Surgical History, Family History, and Social History were reviewed in [employer] record.  ROS  The following are not active complaints unless bolded Hoarseness, sore throat, dysphagia, dental problems, itching, sneezing,  nasal congestion or discharge of excess mucus or purulent secretions, ear ache,   fever, chills, sweats, unintended wt loss or wt gain, classically pleuritic or exertional cp,  orthopnea pnd or arm/hand swelling  or leg swelling, presyncope, palpitations, abdominal pain, anorexia, nausea, vomiting, diarrhea  or change in bowel habits or change in bladder habits, change in stools or change in urine, dysuria, hematuria,  rash, arthralgias, visual complaints, headache, numbness, weakness or ataxia or problems with walking or coordination,  change in mood or  memory.            Outpatient Medications Prior to Visit  Medication Sig Dispense Refill   albuterol  (PROVENTIL )  (2.5 MG/3ML) 0.083% nebulizer solution Take 3 mLs (2.5 mg total) by nebulization every 6 (six) hours as needed for wheezing or shortness of breath. 360 mL 3   albuterol  (VENTOLIN  HFA) 108 (90 Base) MCG/ACT inhaler Inhale 2 puffs into the lungs every 6 (six) hours as needed for wheezing or shortness of breath. 54 g 0   Aspirin-Acetaminophen -Caffeine (GOODY HEADACHE PO) Take 1 tablet by mouth as needed.     DULoxetine  (CYMBALTA ) 60 MG capsule Take 1 capsule (60 mg total) by mouth 2 (two) times daily. 180 capsule 1   famotidine  (PEPCID ) 20 MG tablet Take 1 tablet (20 mg total) by mouth daily. 90 tablet 3   fluticasone  (FLONASE ) 50 MCG/ACT nasal spray Place 1 spray into both nostrils daily. 16 g 2   Fluticasone -Umeclidin-Vilant (TRELEGY ELLIPTA ) 100-62.5-25 MCG/ACT AEPB Inhale 1 puff into the lungs daily in the afternoon. 60 each 0   Magnesium 200 MG TABS Take 200 mg by mouth daily at 12 noon.     metFORMIN  (GLUCOPHAGE ) 500 MG tablet Take 1 tablet (500 mg total) by mouth 2 (two) times daily with a meal. 180 tablet 1   olmesartan  (BENICAR ) 20 MG tablet Take 1 tablet (20 mg total) by mouth daily. 90 tablet 1   omeprazole  (PRILOSEC) 40 MG capsule Take 1 capsule (40 mg total) by mouth 2 (two) times daily before a meal. 60 capsule 11   rosuvastatin  (CRESTOR ) 20 MG tablet Take 1 tablet (20 mg total) by mouth daily. 90 tablet 1   sucralfate  (CARAFATE )  1 g tablet Take 1 tablet (1 g total) by mouth 4 (four) times daily. 120 tablet 1   traZODone  (DESYREL ) 50 MG tablet Take 1 tablet (50 mg total) by mouth at bedtime as needed for sleep. 90 tablet 1   vitamin B-12 (CYANOCOBALAMIN) 1000 MCG tablet Take 1,000 mcg by mouth daily.     No facility-administered medications prior to visit.    Past Medical History:  Diagnosis Date   Anxiety    Arthritis    Asthma    COPD (chronic obstructive pulmonary disease) (HCC)    Depression    Diabetes mellitus without complication (HCC)    High blood pressure    History  of bladder problems    Migraines    Sleep apnea       Objective:     There were no vitals taken for this visit.         Assessment

## 2024-05-25 ENCOUNTER — Ambulatory Visit: Admitting: Internal Medicine

## 2024-05-25 ENCOUNTER — Telehealth: Payer: Self-pay | Admitting: Internal Medicine

## 2024-05-25 DIAGNOSIS — J449 Chronic obstructive pulmonary disease, unspecified: Secondary | ICD-10-CM | POA: Insufficient documentation

## 2024-05-25 DIAGNOSIS — J4489 Other specified chronic obstructive pulmonary disease: Secondary | ICD-10-CM | POA: Insufficient documentation

## 2024-05-25 NOTE — Telephone Encounter (Signed)
 Copied from CRM #[id number]. Topic: Appointments - Scheduling Inquiry for Clinic >> May 25, 2024  9:54 AM Joesph PARAS wrote: Reason for CRM: Patient is calling to inquire if her visit for today cn be a video visit, as she has lost her ride. Please inform patient if this can be a video visit.   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

## 2024-05-26 ENCOUNTER — Encounter: Payer: Self-pay | Admitting: Internal Medicine

## 2024-05-26 ENCOUNTER — Ambulatory Visit (INDEPENDENT_AMBULATORY_CARE_PROVIDER_SITE_OTHER): Admitting: Internal Medicine

## 2024-05-26 VITALS — BP 115/56 | HR 92 | Ht 61.0 in | Wt 164.0 lb

## 2024-05-26 DIAGNOSIS — F17210 Nicotine dependence, cigarettes, uncomplicated: Secondary | ICD-10-CM

## 2024-05-26 DIAGNOSIS — J4489 Other specified chronic obstructive pulmonary disease: Secondary | ICD-10-CM

## 2024-05-26 DIAGNOSIS — J441 Chronic obstructive pulmonary disease with (acute) exacerbation: Secondary | ICD-10-CM | POA: Diagnosis not present

## 2024-05-26 DIAGNOSIS — R911 Solitary pulmonary nodule: Secondary | ICD-10-CM | POA: Diagnosis not present

## 2024-05-26 MED ORDER — ALBUTEROL SULFATE HFA 108 (90 BASE) MCG/ACT IN AERS
2.0000 | INHALATION_SPRAY | RESPIRATORY_TRACT | 0 refills | Status: DC | PRN
Start: 2024-05-26 — End: 2024-08-24

## 2024-05-26 MED ORDER — TRELEGY ELLIPTA 100-62.5-25 MCG/ACT IN AEPB
1.0000 | INHALATION_SPRAY | Freq: Every day | RESPIRATORY_TRACT | Status: DC
Start: 2024-05-26 — End: 2024-08-25

## 2024-05-26 NOTE — Patient Instructions (Addendum)
 Plan A = Automatic = Always=    Trelegy 100 one click each am   Plan B = Backup (to supplement plan A, not to replace it) Use your albuterol  inhaler as a rescue medication to be used if you can't catch your breath by resting or slowing your pace  or doing a relaxed purse lip breathing pattern.  - The less you use it, the better it will work when you need it. - Ok to use the inhaler up to 2 puffs  every 4 hours if you must but call for appointment if use goes up over your usual need - Don't leave home without it !!  (think of it like the spare tire or starter fluid for your car)   Plan C = Crisis (instead of Plan B but only if Plan B stops working) - only use your albuterol  nebulizer if you first try Plan B and it fails to help > ok to use the nebulizer up to every 4 hours but if start needing it regularly call for immediate appointment  The key is to stop smoking completely before smoking completely stops you!   Please schedule a follow up visit in 3 months but call sooner if needed  with all medications /inhalers/ solutions in hand so we can verify exactly what you are taking. This includes all medications from all doctors and over the counters -  PFTs on return

## 2024-05-26 NOTE — Assessment & Plan Note (Addendum)
 Asthmatic bronchitis / GOLD 0 COPD / active smoker - A1AT 12/09/19 >> 184, MM PFT 01/18/20 >> FEV1 1.43 (56%), Ratio  0.70, TLC 4.34 (89%), DLCO 69%, +BD - 05/26/2024  After extensive coaching inhaler device,  effectiveness =  75% hfa/DPI  - 05/26/2024   Walked on RA  x  3  lap(s) =  approx 450  ft  @ mod pace, stopped due to end of study  with lowest 02 sats 93% and sob last lap   - Pfts  05/26/2024 ordered    Group D (now reclassified as E) in terms of symptom/risk and laba/lama/ICS  therefore appropriate rx at this point >>>  continue trelegy with apprpo saba pending pfts  Re SABA :  I spent extra time with pt today reviewing appropriate use of albuterol  for prn use on exertion with the following points: 1) saba is for relief of sob that does not improve by walking a slower pace or resting but rather if the pt does not improve after trying this first. 2) If the pt is convinced, as many are, that saba helps recover from activity faster then it's easy to tell if this is the case by re-challenging : ie stop, take the inhaler, then p 5 minutes try the exact same activity (intensity of workload) that just caused the symptoms and see if they are substantially diminished or not after saba 3) if there is an activity that reproducibly causes the symptoms, try the saba 15 min before the activity on alternate days   If in fact the saba really does help, then fine to continue to use it prn but advised may need to look closer at the maintenance regimen (here [email or web address]) being used to achieve better control of airways disease with exertion.

## 2024-05-26 NOTE — Assessment & Plan Note (Addendum)
 Counseled re importance of smoking cessation but did not meet time criteria for separate billing    Low-dose CT lung cancer screening is recommended for patients who are 48-60 years of age with a 20+ pack-year history of smoking and who are currently smoking or quit <=15 years ago. No coughing up blood  No unintentional weight loss of > 15 pounds in the last 6 months - pt is eligible for scanning yearly until 91 y p quits   Last LSCT  04/29/2024 with MPNs > repeat at 6 m already ordered    Discussed in detail all the  indications, usual  risks and alternatives  relative to the benefits with patient who agrees to proceed with w/u as outlined.     Each maintenance medication was reviewed in detail including emphasizing most importantly the difference between maintenance and prns and under what circumstances the prns are to be triggered using an action plan format where appropriate.  Total time for H and P, chart review, counseling, reviewing hfa/dpi/neb device(s) , directly observing portions of ambulatory 02 saturation study/ and generating customized AVS unique to this office visit / same day charting = 43 min pt new to me.

## 2024-05-26 NOTE — Progress Notes (Signed)
 Jillian [name], female    DOB: October 28, 1965    MRN: [id number]   Brief patient profile:  58  yowf  active smoker  former [facility]ood Patient with AB pt self referred to pulmonary clinic in [place]  05/26/2024  for AB    A1AT 12/09/19 >> 184, MM PFT 01/18/20 >> FEV1 1.43 (56%), Ratio  0.70, TLC 4.34 (89%), DLCO 69%, +BD   Last  [facility]  10/21/23  History of Present Illness  05/26/2024  Pulmonary/ 1st office eval/ Xavion Muscat / [place] [facility] maint Trelegy / still smoking  Chief Complaint  Patient presents with   COPD    ACUTE: Coughing, tight chest, shob  Dyspnea:  onset Jan 2021 and better on Trelegy  Cough: thick yellow worse in am x severa hours to clear  Sleep: pillows under mattress and big one under head and some  cough at hs but then resolved until recurring each am  SABA use: twice daily  02: none     No obvious day to day or daytime pattern/variability or assoc   mucus plugs or hemoptysis or cp or chest tightness, subjective wheeze or overt  hb symptoms.    Also denies any obvious fluctuation of symptoms with weather or environmental changes or other aggravating or alleviating factors except as outlined above   No unusual exposure hx or h/o childhood pna/ asthma or knowledge of premature birth.  Current Allergies, Complete Past Medical History, Past Surgical History, Family History, and Social History were reviewed in [employer] record.  ROS  The following are not active complaints unless bolded Hoarseness, sore throat, dysphagia, dental problems, itching, sneezing,  nasal congestion or discharge of excess mucus or purulent secretions, ear ache,   fever, chills, sweats, unintended wt loss or wt gain, classically pleuritic or exertional cp,  orthopnea pnd or arm/hand swelling  or leg swelling, presyncope, palpitations, abdominal pain, anorexia, nausea, vomiting, diarrhea  or change in bowel habits or change in bladder habits, change in stools or change in urine,  dysuria, hematuria,  rash, arthralgias, visual complaints, headache, numbness, weakness or ataxia or problems with walking or coordination,  change in mood or  memory.            Outpatient Medications Prior to Visit  Medication Sig Dispense Refill   albuterol  (PROVENTIL ) (2.5 MG/3ML) 0.083% nebulizer solution Take 3 mLs (2.5 mg total) by nebulization every 6 (six) hours as needed for wheezing or shortness of breath. 360 mL 3   Aspirin-Acetaminophen -Caffeine (GOODY HEADACHE PO) Take 1 tablet by mouth as needed.     DULoxetine  (CYMBALTA ) 60 MG capsule Take 1 capsule (60 mg total) by mouth 2 (two) times daily. 180 capsule 1   famotidine  (PEPCID ) 20 MG tablet Take 1 tablet (20 mg total) by mouth daily. 90 tablet 3   fluticasone  (FLONASE ) 50 MCG/ACT nasal spray Place 1 spray into both nostrils daily. 16 g 2   Fluticasone -Umeclidin-Vilant (TRELEGY ELLIPTA ) 100-62.5-25 MCG/ACT AEPB Inhale 1 puff into the lungs daily in the afternoon. 60 each 0   Magnesium 200 MG TABS Take 200 mg by mouth daily at 12 noon.     metFORMIN  (GLUCOPHAGE ) 500 MG tablet Take 1 tablet (500 mg total) by mouth 2 (two) times daily with a meal. 180 tablet 1   olmesartan  (BENICAR ) 20 MG tablet Take 1 tablet (20 mg total) by mouth daily. 90 tablet 1   omeprazole  (PRILOSEC) 40 MG capsule Take 1 capsule (40 mg total) by mouth 2 (  two) times daily before a meal. 60 capsule 11   rosuvastatin  (CRESTOR ) 20 MG tablet Take 1 tablet (20 mg total) by mouth daily. 90 tablet 1   traZODone  (DESYREL ) 50 MG tablet Take 1 tablet (50 mg total) by mouth at bedtime as needed for sleep. 90 tablet 1   vitamin B-12 (CYANOCOBALAMIN) 1000 MCG tablet Take 1,000 mcg by mouth daily.     albuterol  (VENTOLIN  HFA) 108 (90 Base) MCG/ACT inhaler Inhale 2 puffs into the lungs every 6 (six) hours as needed for wheezing or shortness of breath. 54 g 0   sucralfate  (CARAFATE ) 1 g tablet Take 1 tablet (1 g total) by mouth 4 (four) times daily. 120 tablet 1   No  facility-administered medications prior to visit.    Past Medical History:  Diagnosis Date   Allergy 2000,   Bananas, Docecycline   Anxiety    Arthritis    Asthma    Chronic kidney disease    COPD (chronic obstructive pulmonary disease) (HCC)    Depression    Diabetes mellitus without complication (HCC)    GERD (gastroesophageal reflux disease)    High blood pressure    History of bladder problems    Migraines    Sleep apnea    Ulcer       Objective:     BP (!) 115/56   Pulse 92   Ht 5' 1 (1.549 m)   Wt 164 lb (74.4 kg)   SpO2 95% Comment: ra  BMI 30.99 kg/m   SpO2: 95 % (ra)  amb mod obese ( by BMI) wf nad / rattlig cough    HEENT : Oropharynx  clear   Nasal turbinates nl    NECK :  without  apparent JVD/ palpable Nodes/TM    LUNGS: no acc muscle use,  Min barrel  contour chest wall with bilateral  slightly decreased bs s audible wheeze and  without cough on insp or exp maneuvers and min  Hyperresonant  to  percussion bilaterally    CV:  RRR  no s3 or murmur or increase in P2, and no edema   ABD:  soft and nontender with pos end  insp Hoover's  in the supine position.  No bruits or organomegaly appreciated   MS:  Nl gait/ ext warm without deformities Or obvious joint restrictions  calf tenderness, cyanosis or clubbing     SKIN: warm and dry without lesions    NEURO:  alert, approp, nl sensorium with  no motor or cerebellar deficits apparent.            Assessment     Assessment & Plan COPD with acute exacerbation (HCC)  Lung nodule See below LDSCT study below   Asthmatic bronchitis / GOLD 0 COPD Asthmatic bronchitis / GOLD 0 COPD / active smoker - A1AT 12/09/19 >> 184, MM PFT 01/18/20 >> FEV1 1.43 (56%), Ratio  0.70, TLC 4.34 (89%), DLCO 69%, +BD - 05/26/2024  After extensive coaching inhaler device,  effectiveness =  75% hfa/DPI  - 05/26/2024   Walked on RA  x  3  lap(s) =  approx 450  ft  @ mod pace, stopped due to end of study  with lowest 02  sats 93% and sob last lap   - Pfts  05/26/2024 ordered    Group D (now reclassified as E) in terms of symptom/risk and laba/lama/ICS  therefore appropriate rx at this point >>>  continue trelegy with apprpo saba pending pfts  Re SABA :  I spent extra time with pt today reviewing appropriate use of albuterol  for prn use on exertion with the following points: 1) saba is for relief of sob that does not improve by walking a slower pace or resting but rather if the pt does not improve after trying this first. 2) If the pt is convinced, as many are, that saba helps recover from activity faster then it's easy to tell if this is the case by re-challenging : ie stop, take the inhaler, then p 5 minutes try the exact same activity (intensity of workload) that just caused the symptoms and see if they are substantially diminished or not after saba 3) if there is an activity that reproducibly causes the symptoms, try the saba 15 min before the activity on alternate days   If in fact the saba really does help, then fine to continue to use it prn but advised may need to look closer at the maintenance regimen (here [email or web address]) being used to achieve better control of airways disease with exertion.      Cigarette smoker Counseled re importance of smoking cessation but did not meet time criteria for separate billing    Low-dose CT lung cancer screening is recommended for patients who are 42-22 years of age with a 20+ pack-year history of smoking and who are currently smoking or quit <=15 years ago. No coughing up blood  No unintentional weight loss of > 15 pounds in the last 6 months - pt is eligible for scanning yearly until 68 y p quits   Last LSCT  04/29/2024 with MPNs > repeat at 6 m already ordered    Discussed in detail all the  indications, usual  risks and alternatives  relative to the benefits with patient who agrees to proceed with w/u as outlined.     Each maintenance medication was reviewed in  detail including emphasizing most importantly the difference between maintenance and prns and under what circumstances the prns are to be triggered using an action plan format where appropriate.  Total time for H and P, chart review, counseling, reviewing hfa/dpi/neb device(s) , directly observing portions of ambulatory 02 saturation study/ and generating customized AVS unique to this office visit / same day charting = 43 min pt new to me.                  Patient Instructions  Plan A = Automatic = Always=    Trelegy 100 one click each am   Plan B = Backup (to supplement plan A, not to replace it) Use your albuterol  inhaler as a rescue medication to be used if you can't catch your breath by resting or slowing your pace  or doing a relaxed purse lip breathing pattern.  - The less you use it, the better it will work when you need it. - Ok to use the inhaler up to 2 puffs  every 4 hours if you must but call for appointment if use goes up over your usual need - Don't leave home without it !!  (think of it like the spare tire or starter fluid for your car)   Plan C = Crisis (instead of Plan B but only if Plan B stops working) - only use your albuterol  nebulizer if you first try Plan B and it fails to help > ok to use the nebulizer up to every 4 hours but if start needing it regularly call for immediate appointment  The key is to stop smoking completely  before smoking completely stops you!   Please schedule a follow up visit in 3 months but call sooner if needed  with all medications /inhalers/ solutions in hand so we can verify exactly what you are taking. This includes all medications from all doctors and over the counters -  PFTs on return       Ozell America, MD 05/26/2024

## 2024-05-27 ENCOUNTER — Telehealth: Payer: Self-pay | Admitting: Internal Medicine

## 2024-05-27 NOTE — Telephone Encounter (Signed)
 LVM for patient regarding the Tuesday 08/25/24 10:00 am PFT at [facility] time is 9:45 am --1st floor registration desk for check in---follow up with DR. Wert 08/25/24 at 11:30 am---will mail information to patient and requested return call with questions or concerns

## 2024-05-28 ENCOUNTER — Encounter: Payer: Self-pay | Admitting: Internal Medicine

## 2024-05-29 ENCOUNTER — Other Ambulatory Visit: Payer: Self-pay

## 2024-05-29 MED ORDER — PREDNISONE 10 MG PO TABS: 10.0000 mg | ORAL_TABLET | Freq: Every day | ORAL | 0 refills | Status: DC

## 2024-05-29 NOTE — Telephone Encounter (Signed)
 Prednisone  10 mg take  4 each am x 2 days,   2 each am x 2 days,  1 each am x 2 days and stop

## 2024-06-08 ENCOUNTER — Encounter: Payer: Self-pay | Admitting: Internal Medicine

## 2024-06-08 ENCOUNTER — Telehealth: Payer: Self-pay

## 2024-06-08 NOTE — Telephone Encounter (Signed)
 I just finished a lite round of prednisone  on Saturday.  My chest is still tight and my upper back is hurting. I've had to use my nebulizer along with the albuterol  and trelegy and nothing is helping. Still no energy and feel terrible. Please help. Thank you  Please advise

## 2024-06-09 ENCOUNTER — Telehealth: Payer: Self-pay

## 2024-06-09 NOTE — Telephone Encounter (Signed)
 Pt states she is feeling better as of now but if it comes back she will go to the ER or call to make an appt.

## 2024-06-10 NOTE — Telephone Encounter (Signed)
 See phone note 06/09/24

## 2024-06-12 ENCOUNTER — Ambulatory Visit: Payer: Self-pay

## 2024-06-25 ENCOUNTER — Ambulatory Visit: Admitting: Nurse Practitioner

## 2024-07-01 ENCOUNTER — Other Ambulatory Visit: Payer: Self-pay | Admitting: Internal Medicine

## 2024-07-01 ENCOUNTER — Encounter: Payer: Self-pay | Admitting: Internal Medicine

## 2024-07-01 MED ORDER — MAGNESIUM 200 MG PO TABS: 200.0000 mg | ORAL_TABLET | Freq: Every day | ORAL | 3 refills | Status: AC

## 2024-07-11 ENCOUNTER — Other Ambulatory Visit: Payer: Self-pay | Admitting: Internal Medicine

## 2024-07-13 NOTE — Telephone Encounter (Signed)
 Pt needs an office visit before anymore refills.

## 2024-07-14 ENCOUNTER — Encounter: Payer: Self-pay | Admitting: Internal Medicine

## 2024-07-14 ENCOUNTER — Other Ambulatory Visit: Payer: Self-pay | Admitting: Internal Medicine

## 2024-07-14 DIAGNOSIS — F5101 Primary insomnia: Secondary | ICD-10-CM

## 2024-07-14 MED ORDER — TRAZODONE HCL 50 MG PO TABS: 75.0000 mg | ORAL_TABLET | Freq: Every evening | ORAL | 0 refills | Status: DC | PRN

## 2024-07-26 ENCOUNTER — Other Ambulatory Visit: Payer: Self-pay | Admitting: Internal Medicine

## 2024-07-26 DIAGNOSIS — F5101 Primary insomnia: Secondary | ICD-10-CM

## 2024-07-29 ENCOUNTER — Ambulatory Visit

## 2024-07-31 ENCOUNTER — Other Ambulatory Visit: Payer: Self-pay

## 2024-07-31 ENCOUNTER — Encounter: Payer: Self-pay | Admitting: Internal Medicine

## 2024-07-31 ENCOUNTER — Telehealth: Payer: Self-pay

## 2024-07-31 ENCOUNTER — Other Ambulatory Visit: Payer: Self-pay | Admitting: Internal Medicine

## 2024-07-31 DIAGNOSIS — Z599 Problem related to housing and economic circumstances, unspecified: Secondary | ICD-10-CM

## 2024-07-31 DIAGNOSIS — Z5982 Transportation insecurity: Secondary | ICD-10-CM

## 2024-07-31 DIAGNOSIS — Z7189 Other specified counseling: Secondary | ICD-10-CM

## 2024-07-31 DIAGNOSIS — Z1231 Encounter for screening mammogram for malignant neoplasm of breast: Secondary | ICD-10-CM

## 2024-07-31 MED ORDER — PREDNISONE 10 MG PO TABS
10.0000 mg | ORAL_TABLET | Freq: Every day | ORAL | 0 refills | Status: DC
Start: 2024-07-31 — End: 2024-08-06

## 2024-07-31 MED ORDER — AZITHROMYCIN 250 MG PO TABS: 250.0000 mg | ORAL_TABLET | Freq: Every day | ORAL | 0 refills | Status: DC

## 2024-07-31 NOTE — Progress Notes (Signed)
 Spoke with patient to reschedule her AWVS. She needed resources and a mammogram. Resources emailed to patients personal email. Mammogram ordered and scheduled with patient.

## 2024-07-31 NOTE — Telephone Encounter (Signed)
 Order placed

## 2024-08-03 ENCOUNTER — Telehealth: Payer: Self-pay

## 2024-08-03 NOTE — Progress Notes (Signed)
 Complex Care Management Note  Care Guide Note 08/03/2024 Name: KENNADI ALBANY MRN: [id number] DOB: [birth year]  Devere JONELLE Cools is a 58 y.o. year old female who sees Tobie Suzzane POUR, MD for primary care. I reached out to Devere JONELLE Cools by phone today to offer complex care management services.  Ms. Biggs was given information about Complex Care Management services today including:   The Complex Care Management services include support from the care team which includes your Nurse Care Manager, Clinical Social Worker, or Pharmacist.  The Complex Care Management team is here to help remove barriers to the health concerns and goals most important to you. Complex Care Management services are voluntary, and the patient may decline or stop services at any time by request to their care team member.   Complex Care Management Consent Status: Patient agreed to services and verbal consent obtained.   Follow up plan:  Telephone appointment with complex care management team member scheduled for:  08/12/2024  Encounter Outcome:  Patient Scheduled  Jeoffrey Buffalo , RMA     [place]  [facility], [facility] Guide  Direct Dial: [phone]  Website: [email or web address]

## 2024-08-06 ENCOUNTER — Other Ambulatory Visit: Payer: Self-pay | Admitting: Internal Medicine

## 2024-08-06 ENCOUNTER — Telehealth (INDEPENDENT_AMBULATORY_CARE_PROVIDER_SITE_OTHER): Admitting: Internal Medicine

## 2024-08-06 ENCOUNTER — Encounter: Payer: Self-pay | Admitting: Internal Medicine

## 2024-08-06 ENCOUNTER — Ambulatory Visit: Payer: Self-pay | Admitting: Internal Medicine

## 2024-08-06 DIAGNOSIS — E1159 Type 2 diabetes mellitus with other circulatory complications: Secondary | ICD-10-CM

## 2024-08-06 DIAGNOSIS — E1169 Type 2 diabetes mellitus with other specified complication: Secondary | ICD-10-CM

## 2024-08-06 DIAGNOSIS — K219 Gastro-esophageal reflux disease without esophagitis: Secondary | ICD-10-CM

## 2024-08-06 DIAGNOSIS — I10 Essential (primary) hypertension: Secondary | ICD-10-CM

## 2024-08-06 DIAGNOSIS — Z7984 Long term (current) use of oral hypoglycemic drugs: Secondary | ICD-10-CM

## 2024-08-06 DIAGNOSIS — E782 Mixed hyperlipidemia: Secondary | ICD-10-CM

## 2024-08-06 DIAGNOSIS — J449 Chronic obstructive pulmonary disease, unspecified: Secondary | ICD-10-CM

## 2024-08-06 DIAGNOSIS — J441 Chronic obstructive pulmonary disease with (acute) exacerbation: Secondary | ICD-10-CM

## 2024-08-06 DIAGNOSIS — M797 Fibromyalgia: Secondary | ICD-10-CM

## 2024-08-06 DIAGNOSIS — J0111 Acute recurrent frontal sinusitis: Secondary | ICD-10-CM

## 2024-08-06 DIAGNOSIS — E785 Hyperlipidemia, unspecified: Secondary | ICD-10-CM

## 2024-08-06 MED ORDER — PREDNISONE 20 MG PO TABS
ORAL_TABLET | ORAL | 0 refills | Status: AC
Start: 2024-08-06 — End: 2024-08-16

## 2024-08-06 MED ORDER — AMOXICILLIN-POT CLAVULANATE 875-125 MG PO TABS: 1.0000 | ORAL_TABLET | Freq: Two times a day (BID) | ORAL | 0 refills | Status: DC

## 2024-08-06 MED ORDER — PREDNISONE 20 MG PO TABS
ORAL_TABLET | ORAL | 0 refills | Status: DC
Start: 2024-08-06 — End: 2024-08-06

## 2024-08-06 NOTE — Assessment & Plan Note (Signed)
 Lab Results  Component Value Date   HGBA1C 6.8 (H) 04/16/2024   Well-controlled now Associated with HTN and HLD On Metformin  500 mg twice daily Advised to follow diabetic diet On ARB and statin F/u CMP and lipid panel Diabetic eye exam: Advised to follow up with Ophthalmology for diabetic eye exam

## 2024-08-06 NOTE — Progress Notes (Signed)
 Virtual Visit via Video Note   Because of Jillian [name]'s co-morbid illnesses, she is at least at moderate risk for complications without adequate follow up.  This format is felt to be most appropriate for this patient at this time.  All issues noted in this document were discussed and addressed.  A limited physical exam was performed with this format.      Evaluation Performed:  Follow-up visit  Date:  08/06/2024   ID:  Jillian [name], DOB 1966/02/13, MRN [id number]  Patient Location: Home Provider Location: Office/Clinic  Participants: Patient Location of Patient: Home Location of Provider: Telehealth Consent was obtain for visit to be over via telehealth. I verified that I am speaking with the correct person using two identifiers.  PCP:  Tobie Suzzane POUR, MD   Chief Complaint: Follow-up of her chronic medical conditions  History of Present Illness:    Jillian [name] is a 58 y.o. female with PMH of HTN, COPD, OSA, type II DM, GERD and tobacco abuse who has a video visit for follow-up of her chronic medical conditions.   HTN: Her BP is well controlled currently.  She takes olmesartan  20 mg QD currently.  Denies any headache, dizziness or chest pain currently.   Type II DM: Her HbA1c was 6.8 in 07/25. She takes metformin  500 mg twice daily. She denies polyuria or polyphagia.  Has chronic fatigue. She has been placed on oral prednisone  recently due to COPD exacerbation.   OSA: She has chronic fatigue and insomnia.  She has CPAP, but does not tolerate the mask.  Followed by Dr. Jude currently.  She takes trazodone  as needed for insomnia.   COPD: She complains of recent worsening of cough and dyspnea, was given oral prednisone  for 10 days by pulmonology.  She has noticed mild improvement, but still has dry cough, nasal congestion and postnasal drip.  She uses Trelegy regularly and albuterol  inhaler as needed for dyspnea or wheezing.  She had responded well to ProAir , but had to use  Ventolin  recently due to insurance formulary, but does not get adequate relief with Ventolin .  She has albuterol  nebulizer as well.  Denies any recent worsening of dyspnea or wheezing currently.  She still smokes 2 pack/day, but reports that she lights it, but does not smoke whole cigarette.  She was given Wellbutrin  recently for tobacco cessation, but did not tolerate it.   Fibromyalgia: She takes Cymbalta  60 mg twice daily currently.  She has diffuse muscle aches, around neck, low back, upper and lower extremities.  She has poor sleep quality, has difficulty maintaining sleep.  She takes trazodone  for insomnia.  She did not tolerate Elavil  and Lyrica .  She was recently given Flexeril  5 mg, but she stopped taking it as she did not feel any different.  Denies anhedonia, SI or HI.   GERD: She takes omeprazole  40 mg twice daily, followed by GI.  Denies nausea, vomiting, dysphagia or odynophagia currently.  The patient does not have symptoms concerning for COVID-19 infection (fever, chills, cough, or new shortness of breath).   Past Medical, Surgical, Social History, Allergies, and Medications have been Reviewed.  Past Medical History:  Diagnosis Date   Allergy 2000,   Bananas, Docecycline   Anxiety    Arthritis    Asthma    Chronic kidney disease    COPD (chronic obstructive pulmonary disease) (HCC)    Depression    Diabetes mellitus without complication (HCC)    GERD (gastroesophageal  reflux disease)    High blood pressure    History of bladder problems    Migraines    Sleep apnea    Ulcer    Past Surgical History:  Procedure Laterality Date   ABDOMINAL HYSTERECTOMY  1991   APPENDECTOMY     BALLOON DILATION N/A 01/13/2024   Procedure: BALLOON DILATION;  Surgeon: Cindie Carlin POUR, DO;  [facility];  Service: Endoscopy;  Laterality: N/A;   BIOPSY  08/12/2023   Procedure: BIOPSY;  Surgeon: Cindie Carlin POUR, DO;  [facility];  Service: Endoscopy;;   BLADDER  SURGERY     x3   COLON SURGERY  1982   COLONOSCOPY  06/2018   Dr. Saintclair: Normal terminal ileum with normal biopsies.  Evidence of prior end to side ileocolonic anastomosis in the cecum.  This was patent and was characterized by healthy-appearing mucosa.  Multiple small and large mouth diverticula noted in the sigmoid colon and descending colon.  Random colon biopsies were benign.  Recommended colonoscopy in 10 years.   ESOPHAGOGASTRODUODENOSCOPY (EGD) WITH PROPOFOL  N/A 08/12/2023   Procedure: ESOPHAGOGASTRODUODENOSCOPY (EGD) WITH PROPOFOL ;  Surgeon: Cindie Carlin POUR, DO;  [facility];  Service: Endoscopy;  Laterality: N/A;  245pm, asa 3   ESOPHAGOGASTRODUODENOSCOPY (EGD) WITH PROPOFOL  N/A 01/13/2024   Procedure: ESOPHAGOGASTRODUODENOSCOPY (EGD) WITH PROPOFOL ;  Surgeon: Cindie Carlin POUR, DO;  [facility];  Service: Endoscopy;  Laterality: N/A;  1015am, asa 3   ileum resection  1982   LAPAROSCOPIC CHOLECYSTECTOMY     PARTIAL HYSTERECTOMY       Current Meds  Medication Sig   albuterol  (PROVENTIL ) (2.5 MG/3ML) 0.083% nebulizer solution Take 3 mLs (2.5 mg total) by nebulization every 6 (six) hours as needed for wheezing or shortness of breath.   albuterol  (VENTOLIN  HFA) 108 (90 Base) MCG/ACT inhaler Inhale 2 puffs into the lungs every 4 (four) hours as needed for wheezing or shortness of breath.   amoxicillin -clavulanate (AUGMENTIN ) 875-125 MG tablet Take 1 tablet by mouth 2 (two) times daily.   Aspirin-Acetaminophen -Caffeine (GOODY HEADACHE PO) Take 1 tablet by mouth as needed.   DULoxetine  (CYMBALTA ) 60 MG capsule TAKE 1 CAPSULE BY MOUTH 2 TIMES DAILY.   famotidine  (PEPCID ) 20 MG tablet Take 1 tablet (20 mg total) by mouth daily.   fluticasone  (FLONASE ) 50 MCG/ACT nasal spray Place 1 spray into both nostrils daily.   Fluticasone -Umeclidin-Vilant (TRELEGY ELLIPTA ) 100-62.5-25 MCG/ACT AEPB Inhale 1 puff into the lungs daily in the afternoon.   Fluticasone -Umeclidin-Vilant  (TRELEGY ELLIPTA ) 100-62.5-25 MCG/ACT AEPB Inhale 1 puff into the lungs daily.   Magnesium  200 MG TABS Take 1 tablet (200 mg total) by mouth daily at 12 noon.   metFORMIN  (GLUCOPHAGE ) 500 MG tablet TAKE 1 TABLET BY MOUTH 2 TIMES DAILY WITH A MEAL.   olmesartan  (BENICAR ) 20 MG tablet TAKE 1 TABLET BY MOUTH EVERY DAY   omeprazole  (PRILOSEC) 40 MG capsule TAKE 1 CAPSULE (40 MG TOTAL) BY MOUTH 2 (TWO) TIMES DAILY BEFORE A MEAL.   rosuvastatin  (CRESTOR ) 20 MG tablet TAKE 1 TABLET BY MOUTH EVERY DAY   traZODone  (DESYREL ) 50 MG tablet Take 1.5 tablets (75 mg total) by mouth at bedtime as needed for sleep.   vitamin B-12 (CYANOCOBALAMIN) 1000 MCG tablet Take 1,000 mcg by mouth daily.   [DISCONTINUED] azithromycin  (ZITHROMAX ) 250 MG tablet Take 1 tablet (250 mg total) by mouth daily.   [DISCONTINUED] predniSONE  (DELTASONE ) 10 MG tablet Take 1 tablet (10 mg total) by mouth daily  with breakfast. 10 mg take 4 each am x 2 days 2 each am x 2 days 1 each am x 2 days and stop   [DISCONTINUED] predniSONE  (DELTASONE ) 10 MG tablet Take 1 tablet (10 mg total) by mouth daily with breakfast. 4 each am x 2 days, 2 each am x 2 days,1 each am x 2 days and stop     Allergies:   Doxycycline and Banana   ROS:   Please see the history of present illness. All other systems reviewed and are negative.   Labs/Other Tests and Data Reviewed:    Recent Labs: 01/03/2024: TSH 1.900 04/16/2024: ALT 16; BUN 15; Creatinine, Ser 1.55; Hemoglobin 13.5; Platelets 281; Potassium 5.4; Sodium 140   Recent Lipid Panel Lab Results  Component Value Date/Time   CHOL 92 (L) 01/03/2024 09:23 AM   TRIG 204 (H) 01/03/2024 09:23 AM   HDL 39 (L) 01/03/2024 09:23 AM   CHOLHDL 2.4 01/03/2024 09:23 AM   LDLCALC 21 01/03/2024 09:23 AM    Wt Readings from Last 3 Encounters:  05/26/24 164 lb (74.4 kg)  05/20/24 164 lb 1.3 oz (74.4 kg)  05/01/24 170 lb (77.1 kg)     Objective:    Vital Signs:  There were no vitals taken for this visit.    VITAL SIGNS:  reviewed GEN:  no acute distress EYES:  sclerae anicteric, EOMI - Extraocular Movements Intact RESPIRATORY:  Able to speak in full sentences, but subjective dyspnea. NEURO:  alert and oriented x 3, no obvious focal deficit PSYCH:  normal affect  ASSESSMENT & PLAN:    Essential hypertension BP Readings from Last 1 Encounters:  05/26/24 (!) 115/56   Well-controlled with olmesartan  20 mg QD Counseled for compliance with the medications Advised DASH diet and moderate exercise/walking, at least 150 mins/week   COPD (chronic obstructive pulmonary disease) with acute exacerbation Has recent worsening of cough and dyspnea, likely due to COPD exacerbation Prednisone  taper prescribed Started oral Augmentin  Overall well controlled with Trelegy and as needed albuterol  Needs to cut down -> quit smoking Followed by pulmonology  Type 2 diabetes mellitus with other specified complication (HCC) Lab Results  Component Value Date   HGBA1C 6.8 (H) 04/16/2024   Well-controlled now Associated with HTN and HLD On Metformin  500 mg twice daily Advised to follow diabetic diet On ARB and statin F/u CMP and lipid panel Diabetic eye exam: Advised to follow up with Ophthalmology for diabetic eye exam  Fibromyalgia Uncontrolled, likely due to poor sleep quality Currently takes Cymbalta  60 mg twice daily Tried Elavil  10 mg at bedtime, but she could not tolerate it Tried and Flexeril , but did not get any relief - she has stopped taking them She prefers to take oral steroid, had lengthy discussion to avoid prolonged use of oral steroids due to its systemic side effects  Gastroesophageal reflux disease Continue omeprazole  40 mg BID Follow-up with GI   I discussed the assessment and treatment plan with the patient. The patient was provided an opportunity to ask questions, and all were answered. The patient agreed with the plan and demonstrated an understanding of the instructions.    The patient was advised to call back or seek an in-person evaluation if the symptoms worsen or if the condition fails to improve as anticipated.  The above assessment and management plan was discussed with the patient. The patient verbalized understanding of and has agreed to the management plan.   Medication Adjustments/Labs and Tests Ordered: Current medicines are reviewed at  length with the patient today.  Concerns regarding medicines are outlined above.   Tests Ordered: Orders Placed This Encounter  Procedures   CBC with Differential/Platelet   CMP14+EGFR   Hemoglobin A1c   TSH + free T4    Medication Changes: Meds ordered this encounter  Medications   DISCONTD: predniSONE  (DELTASONE ) 20 MG tablet    Sig: Take 2 tablets (40 mg total) by mouth daily with breakfast for 3 days, THEN 1 tablet (20 mg total) daily with breakfast for 3 days, THEN 0.5 tablets (10 mg total) daily with breakfast for 4 days. 4 each am x 2 days, 2 each am x 2 days,1 each am x 2 days and stop.    Dispense:  11 tablet    Refill:  0   amoxicillin -clavulanate (AUGMENTIN ) 875-125 MG tablet    Sig: Take 1 tablet by mouth 2 (two) times daily.    Dispense:  14 tablet    Refill:  0   predniSONE  (DELTASONE ) 20 MG tablet    Sig: Take 2 tablets (40 mg total) by mouth daily with breakfast for 3 days, THEN 1 tablet (20 mg total) daily with breakfast for 3 days, THEN 0.5 tablets (10 mg total) daily with breakfast for 4 days.    Dispense:  11 tablet    Refill:  0    Please cancel previous prescription.     Note: This dictation was prepared with Dragon dictation along with smaller phrase technology. Similar sounding words can be transcribed inadequately or may not be corrected upon review. Any transcriptional errors that result from this process are unintentional.      Disposition:  Follow up  Signed, Suzzane MARLA Blanch, MD  08/06/2024 11:09 PM     [facility]

## 2024-08-06 NOTE — Assessment & Plan Note (Addendum)
 Has recent worsening of cough and dyspnea, likely due to COPD exacerbation Prednisone  taper prescribed Started oral Augmentin  Overall well controlled with Trelegy and as needed albuterol  Needs to cut down -> quit smoking Followed by pulmonology

## 2024-08-06 NOTE — Assessment & Plan Note (Signed)
 Continue omeprazole  40 mg BID Follow-up with GI

## 2024-08-06 NOTE — Assessment & Plan Note (Addendum)
 Uncontrolled, likely due to poor sleep quality Currently takes Cymbalta  60 mg twice daily Tried Elavil  10 mg at bedtime, but she could not tolerate it Tried and Flexeril , but did not get any relief - she has stopped taking them She prefers to take oral steroid, had lengthy discussion to avoid prolonged use of oral steroids due to its systemic side effects

## 2024-08-06 NOTE — Patient Instructions (Signed)
 Please start taking Augmentin  and prednisone  as prescribed.  Please continue to take medications as prescribed.  Please continue to follow low carb diet and perform moderate exercise/walking as tolerated.  Please get fasting blood tests done after 1 week.

## 2024-08-06 NOTE — Assessment & Plan Note (Signed)
 BP Readings from Last 1 Encounters:  05/26/24 (!) 115/56   Well-controlled with olmesartan  20 mg QD Counseled for compliance with the medications Advised DASH diet and moderate exercise/walking, at least 150 mins/week

## 2024-08-12 ENCOUNTER — Other Ambulatory Visit: Payer: Self-pay | Admitting: Licensed Clinical Social Worker

## 2024-08-13 NOTE — Patient Instructions (Signed)
 Visit Information  Thank you for taking time to visit with me today. Please don't hesitate to contact me if I can be of assistance to you in the future!  Closing From: Complex Care Management.  Please call the care guide team at [phone] if you need to cancel, schedule, or reschedule an appointment.   Please call the Suicide and Crisis Lifeline: 988 call the [place]  National Suicide Prevention Lifeline: 541-541-9485 or TTY: (812)160-9997 TTY 581-252-3707) to talk to a trained counselor call 1-800-273-TALK (toll free, 24 hour hotline) go to [facility] [address], [place] [phone]) call the [facility] Crisis Line: 2082892208 call 911 if you are experiencing a Mental Health or Behavioral Health Crisis or need someone to talk to.  Lyle Rung, BSW, MSW, LCSW Licensed Clinical Social Worker [employer]   [email or web address] Direct Dial: [phone]

## 2024-08-13 NOTE — Patient Outreach (Signed)
 Complex Care Management   Visit Note  08/12/2024  Name:  Jillian [name] MRN: [id number] DOB: Aug 25, 1966  Situation: Referral received for Complex Care Management related to SDOH Barriers:  Transportation Financial Resource Strain I obtained verbal consent from Patient.  Visit completed with Patient  on the phone  Background:   Past Medical History:  Diagnosis Date   Allergy 2000,   Bananas, Docecycline   Anxiety    Arthritis    Asthma    Chronic kidney disease    COPD (chronic obstructive pulmonary disease) (HCC)    Depression    Diabetes mellitus without complication (HCC)    GERD (gastroesophageal reflux disease)    High blood pressure    History of bladder problems    Migraines    Sleep apnea    Ulcer     Assessment: Patient Reported Symptoms:  Cognitive Cognitive Status: Able to follow simple commands Cognitive/Intellectual Conditions Management [RPT]: None reported or documented in medical history or problem list   Health Maintenance Behaviors: Annual physical exam Healing Pattern: Average Health Facilitated by: Stress management, Rest  Neurological Neurological Review of Symptoms: No symptoms reported Neurological Management Strategies: Adequate rest, Routine screening Neurological Self-Management Outcome: 4 (good)  Psychosocial Psychosocial Symptoms Reported: No symptoms reported Additional Psychological Details: BH resources offered but patient declined at this time Behavioral Management Strategies: Medication therapy Behavioral Health Self-Management Outcome: 4 (good) Major Change/Loss/Stressor/Fears (CP): Denies Quality of Family Relationships: involved Do you feel physically threatened by others?: No    08/13/2024    PHQ2-9 Depression Screening   Little interest or pleasure in doing things More than half the days  Feeling down, depressed, or hopeless Several days  PHQ-2 - Total Score 3  Trouble falling or staying asleep, or sleeping too much Not at  all  Feeling tired or having little energy Not at all  Poor appetite or overeating  Not at all  Feeling bad about yourself - or that you are a failure or have let yourself or your family down Not at all  Trouble concentrating on things, such as reading the newspaper or watching television Not at all  Moving or speaking so slowly that other people could have noticed.  Or the opposite - being so fidgety or restless that you have been moving around a lot more than usual Not at all  Thoughts that you would be better off dead, or hurting yourself in some way Not at all  PHQ2-9 Total Score 3  If you checked off any problems, how difficult have these problems made it for you to do your work, take care of things at home, or get along with other people Not difficult at all  Depression Interventions/Treatment Medication, Patient refuses Treatment    There were no vitals filed for this visit.  Medications Reviewed Today     Reviewed by Merlynn Lyle CROME, LCSW (Social Worker) on 08/13/24 at 1011  Med List Status: <None>   Medication Order Taking? Sig Documenting Provider Last Dose Status Informant  albuterol  (PROVENTIL ) (2.5 MG/3ML) 0.083% nebulizer solution [id number]  Take 3 mLs (2.5 mg total) by nebulization every 6 (six) hours as needed for wheezing or shortness of breath. Tobie Suzzane POUR, MD  Active   albuterol  (VENTOLIN  HFA) 108 [phone] Base) MCG/ACT inhaler [id number]  Inhale 2 puffs into the lungs every 4 (four) hours as needed for wheezing or shortness of breath. Darlean Ozell NOVAK, MD  Active   amoxicillin -clavulanate (AUGMENTIN ) 875-125 MG tablet [id number]  Take 1  tablet by mouth 2 (two) times daily. Tobie Suzzane POUR, MD  Active   Aspirin-Acetaminophen -Caffeine (GOODY HEADACHE PO) [id number]  Take 1 tablet by mouth as needed. [provider]  Active Self  DULoxetine  (CYMBALTA ) 60 MG capsule [id number]  TAKE 1 CAPSULE BY MOUTH 2 TIMES DAILY. Tobie Suzzane POUR, MD  Active   famotidine  (PEPCID ) 20 MG  tablet [id number]  Take 1 tablet (20 mg total) by mouth daily. Tobie Suzzane POUR, MD  Active   fluticasone  (FLONASE ) 50 MCG/ACT nasal spray [id number]  Place 1 spray into both nostrils daily. Tobie Suzzane POUR, MD  Active   Fluticasone -Umeclidin-Vilant (TRELEGY ELLIPTA ) 100-62.5-25 MCG/ACT AEPB [id number]  Inhale 1 puff into the lungs daily in the afternoon. Kassie Acquanetta Bradley, MD  Active   Fluticasone -Umeclidin-Vilant (TRELEGY ELLIPTA ) 100-62.5-25 MCG/ACT AEPB [id number]  Inhale 1 puff into the lungs daily. Darlean Ozell NOVAK, MD  Active   Magnesium  200 MG TABS [id number]  Take 1 tablet (200 mg total) by mouth daily at 12 noon. Tobie Suzzane POUR, MD  Active   metFORMIN  (GLUCOPHAGE ) 500 MG tablet [id number]  TAKE 1 TABLET BY MOUTH 2 TIMES DAILY WITH A MEAL. Tobie Suzzane POUR, MD  Active   olmesartan  (BENICAR ) 20 MG tablet [id number]  TAKE 1 TABLET BY MOUTH EVERY DAY Tobie Suzzane POUR, MD  Active   omeprazole  (PRILOSEC) 40 MG capsule [id number]  TAKE 1 CAPSULE (40 MG TOTAL) BY MOUTH 2 (TWO) TIMES DAILY BEFORE A MEAL. Cindie Carlin POUR, DO  Active   predniSONE  (DELTASONE ) 20 MG tablet [id number]  Take 2 tablets (40 mg total) by mouth daily with breakfast for 3 days, THEN 1 tablet (20 mg total) daily with breakfast for 3 days, THEN 0.5 tablets (10 mg total) daily with breakfast for 4 days. Tobie Suzzane POUR, MD  Active   rosuvastatin  (CRESTOR ) 20 MG tablet [id number]  TAKE 1 TABLET BY MOUTH EVERY DAY Patel, Rutwik K, MD  Active   traZODone  (DESYREL ) 50 MG tablet [id number]  Take 1.5 tablets (75 mg total) by mouth at bedtime as needed for sleep. Tobie Suzzane POUR, MD  Active   vitamin B-12 (CYANOCOBALAMIN) 1000 MCG tablet [id number]  Take 1,000 mcg by mouth daily. [provider]  Active            SDOH Interventions    Flowsheet Row Patient Outreach Telephone from 08/12/2024 in [facility] from 01/12/2021 in [facility]  SDOH  Interventions    Food Insecurity Interventions Community Resources Provided, [place] Resources Referral --  Housing Interventions Intervention Not Indicated --  Transportation Interventions Community Resources Provided, Patient Resources (Friends/Family), Payor Benefit --  Depression Interventions/Treatment  Medication, Patient refuses Treatment Medication  Financial Strain Interventions [place] Resource Referral --  Stress Interventions Patient Declined --   Recommendation:   PCP Follow-up Continue Current Plan of Care Contact [facility]- email sent with resources,  Stable transportation confirmed for respiratory test appointment on 08/25/24  Follow Up Plan:   Closing From:  Complex Care Management  Lyle Rung, BSW, MSW, LCSW Licensed Clinical Social Worker [employer]   [email or web address] Direct Dial: [phone]

## 2024-08-18 ENCOUNTER — Encounter

## 2024-08-23 NOTE — Progress Notes (Deleted)
 GI Office Note    Referring Provider: Tobie Suzzane POUR, MD Primary Care Physician:  Tobie Suzzane POUR, MD Primary Gastroenterologist: Carlin POUR. Cindie, DO  Date:  08/23/2024  ID:  Jillian [name], DOB 1966/10/14, MRN [id number]   Chief Complaint   No chief complaint on file.  History of Present Illness  Jillian [name] is a 58 y.o. female with a history of *** presenting today with complaint of   Seen October 2024 for abdominal pain, poor appetite and epigastric pain.   Mesenteric artery duplex US  Sept 2024: stenosis of SMA and celiac artery.   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 SMA and IMA patent.   EGD Oct 2024: -significant gastritis -superficial ulcerations -8mm cratered gastric ulcer (biopsy neg h. Pylori) -duodenal stricture dilated with endoscope  Last visit virtually 10/30/2023 with Dr. Cindie. Regurgitating food, gerd at night. Taking omeprazole  40 mg BID and famotidine  20 mg at night. She also complained of mucus in stools and fecal urgency. Reported history of NSAID use with goody powders frequently with recent decrease. This visit overall feeling much better and abdominal pain improved and bloating resolved but having breakthrough reflux nocturnally, decreasing night time meals. Advised small meals. Advised continue omeprazole  40 mg BID and Carafate  as needed. Advised repeat EGD. Advised may need to update colonoscopy in near future.   EGD 01/13/2024: - Widely patent Schatzki ring.  - Gastritis. Biopsied.  - Non- bleeding gastric ulcer with a clean ulcer base ( Forrest Class III). 6mm. Biopsied.  - Acquired duodenal stenosis. Dilated.  - Normal second portion of the duodenum. - PPI BID, avoid NSAIDs  Today:     Wt Readings from Last 6 Encounters:  05/26/24 164 lb (74.4 kg)  05/20/24 164 lb 1.3 oz (74.4 kg)  05/01/24 170 lb (77.1 kg)  01/13/24 170 lb 10.2 oz (77.4 kg)  01/09/24  170 lb 12.8 oz (77.5 kg)  01/03/24 170 lb 12.8 oz (77.5 kg)    There is no height or weight on file to calculate BMI.   Current Outpatient Medications  Medication Sig Dispense Refill   albuterol  (PROVENTIL ) (2.5 MG/3ML) 0.083% nebulizer solution Take 3 mLs (2.5 mg total) by nebulization every 6 (six) hours as needed for wheezing or shortness of breath. 360 mL 3   albuterol  (VENTOLIN  HFA) 108 (90 Base) MCG/ACT inhaler Inhale 2 puffs into the lungs every 4 (four) hours as needed for wheezing or shortness of breath. 54 g 0   amoxicillin -clavulanate (AUGMENTIN ) 875-125 MG tablet Take 1 tablet by mouth 2 (two) times daily. 14 tablet 0   Aspirin-Acetaminophen -Caffeine (GOODY HEADACHE PO) Take 1 tablet by mouth as needed.     DULoxetine  (CYMBALTA ) 60 MG capsule TAKE 1 CAPSULE BY MOUTH 2 TIMES DAILY. 180 capsule 1   famotidine  (PEPCID ) 20 MG tablet Take 1 tablet (20 mg total) by mouth daily. 90 tablet 3   fluticasone  (FLONASE ) 50 MCG/ACT nasal spray Place 1 spray into both nostrils daily. 16 g 2   Fluticasone -Umeclidin-Vilant (TRELEGY ELLIPTA ) 100-62.5-25 MCG/ACT AEPB Inhale 1 puff into the lungs daily in the afternoon. 60 each 0   Fluticasone -Umeclidin-Vilant (TRELEGY ELLIPTA ) 100-62.5-25 MCG/ACT AEPB Inhale 1 puff into the lungs daily.     Magnesium  200 MG TABS Take 1 tablet (200 mg total) by mouth daily at 12 noon. 90 tablet 3   metFORMIN  (GLUCOPHAGE ) 500 MG tablet TAKE 1 TABLET BY MOUTH 2 TIMES DAILY  WITH A MEAL. 180 tablet 1   olmesartan  (BENICAR ) 20 MG tablet TAKE 1 TABLET BY MOUTH EVERY DAY 90 tablet 1   omeprazole  (PRILOSEC) 40 MG capsule TAKE 1 CAPSULE (40 MG TOTAL) BY MOUTH 2 (TWO) TIMES DAILY BEFORE A MEAL. 180 capsule 0   rosuvastatin  (CRESTOR ) 20 MG tablet TAKE 1 TABLET BY MOUTH EVERY DAY 90 tablet 1   traZODone  (DESYREL ) 50 MG tablet Take 1.5 tablets (75 mg total) by mouth at bedtime as needed for sleep. 135 tablet 0   vitamin B-12 (CYANOCOBALAMIN) 1000 MCG tablet Take 1,000 mcg by  mouth daily.     No current facility-administered medications for this visit.    Past Medical History:  Diagnosis Date   Allergy 2000,   Bananas, Docecycline   Anxiety    Arthritis    Asthma    Chronic kidney disease    COPD (chronic obstructive pulmonary disease) (HCC)    Depression    Diabetes mellitus without complication (HCC)    GERD (gastroesophageal reflux disease)    High blood pressure    History of bladder problems    Migraines    Sleep apnea    Ulcer     Past Surgical History:  Procedure Laterality Date   ABDOMINAL HYSTERECTOMY  1991   APPENDECTOMY     BALLOON DILATION N/A 01/13/2024   Procedure: BALLOON DILATION;  Surgeon: Cindie Carlin POUR, DO;  Location: [facility];  Service: Endoscopy;  Laterality: N/A;   BIOPSY  08/12/2023   Procedure: BIOPSY;  Surgeon: Cindie Carlin POUR, DO;  Location: [facility];  Service: Endoscopy;;   BLADDER SURGERY     x3   COLON SURGERY  1982   COLONOSCOPY  06/2018   Dr. Saintclair: Normal terminal ileum with normal biopsies.  Evidence of prior end to side ileocolonic anastomosis in the cecum.  This was patent and was characterized by healthy-appearing mucosa.  Multiple small and large mouth diverticula noted in the sigmoid colon and descending colon.  Random colon biopsies were benign.  Recommended colonoscopy in 10 years.   ESOPHAGOGASTRODUODENOSCOPY (EGD) WITH PROPOFOL  N/A 08/12/2023   Procedure: ESOPHAGOGASTRODUODENOSCOPY (EGD) WITH PROPOFOL ;  Surgeon: Cindie Carlin POUR, DO;  Location: [facility];  Service: Endoscopy;  Laterality: N/A;  245pm, asa 3   ESOPHAGOGASTRODUODENOSCOPY (EGD) WITH PROPOFOL  N/A 01/13/2024   Procedure: ESOPHAGOGASTRODUODENOSCOPY (EGD) WITH PROPOFOL ;  Surgeon: Cindie Carlin POUR, DO;  Location: [facility];  Service: Endoscopy;  Laterality: N/A;  1015am, asa 3   ileum resection  1982   LAPAROSCOPIC CHOLECYSTECTOMY     PARTIAL HYSTERECTOMY      Family History  Problem Relation Age of Onset   Diabetes  Mother    COPD Mother    Heart attack Father    Diabetes Sister    ADD / ADHD Son    Alcohol abuse Son    Colon cancer Neg Hx    Celiac disease Neg Hx    Inflammatory bowel disease Neg Hx     Allergies as of 08/24/2024 - Review Complete 08/06/2024  Allergen Reaction Noted   Doxycycline Photosensitivity 08/23/2016   Banana Hives 07/10/2019    Social History   Socioeconomic History   Marital status: Divorced    Spouse name: Not on file   Number of children: 1   Years of education: Not on file   Highest education level: Some college, no degree  Occupational History   Occupation: Comptroller: [employer]  Comment: currently on short term disability  Tobacco Use   Smoking status: Every Day    Current packs/day: 1.50    Average packs/day: 1.5 packs/day for 43.0 years (64.5 ttl pk-yrs)    Types: Cigarettes   Smokeless tobacco: Never   Tobacco comments:    2 ppd   Vaping Use   Vaping status: Never Used  Substance and Sexual Activity   Alcohol use: Yes    Comment: hardly ever   Drug use: No   Sexual activity: Not on file  Other Topics Concern   Not on file  Social History Narrative   Not on file   Social Drivers of Health   Financial Resource Strain: High Risk (08/12/2024)   Overall Financial Resource Strain (CARDIA)    Difficulty of Paying Living Expenses: Hard  Food Insecurity: Food Insecurity Present (08/13/2024)   Hunger Vital Sign    Worried About Running Out of Food in the Last Year: Sometimes true    Ran Out of Food in the Last Year: Sometimes true  Transportation Needs: Unmet Transportation Needs (08/12/2024)   PRAPARE - Administrator, Civil Service (Medical): Yes    Lack of Transportation (Non-Medical): No  Physical Activity: Inactive (07/28/2024)   Exercise Vital Sign    Days of Exercise per Week: 0 days    Minutes of Exercise per Session: Not on file  Stress: No Stress Concern Present (08/12/2024)   [employer]  of Occupational Health - Occupational Stress Questionnaire    Feeling of Stress: Only a little  Recent Concern: Stress - Stress Concern Present (07/28/2024)   [employer] of Occupational Health - Occupational Stress Questionnaire    Feeling of Stress: Very much  Social Connections: Socially Isolated (07/28/2024)   Social Connection and Isolation Panel    Frequency of Communication with Friends and Family: More than three times a week    Frequency of Social Gatherings with Friends and Family: Three times a week    Attends Religious Services: Never    Active Member of Clubs or Organizations: No    Attends Engineer, Structural: Not on file    Marital Status: Divorced    Review of Systems   Gen: Denies fever, chills, anorexia. Denies fatigue, weakness, weight loss.  CV: Denies chest pain, palpitations, syncope, peripheral edema, and claudication. Resp: Denies dyspnea at rest, cough, wheezing, coughing up blood, and pleurisy. GI: See HPI Derm: Denies rash, itching, dry skin Psych: Denies depression, anxiety, memory loss, confusion. No homicidal or suicidal ideation.  Heme: Denies bruising, bleeding, and enlarged lymph nodes.  Physical Exam   There were no vitals taken for this visit.  General:   Alert and oriented. No distress noted. Pleasant and cooperative.  Head:  Normocephalic and atraumatic. Eyes:  Conjuctiva clear without scleral icterus. Mouth:  Oral mucosa pink and moist. Good dentition. No lesions. Lungs:  Clear to auscultation bilaterally. No wheezes, rales, or rhonchi. No distress.  Heart:  S1, S2 present without murmurs appreciated.  Abdomen:  +BS, soft, non-tender and non-distended. No rebound or guarding. No HSM or masses noted. Rectal: *** Msk:  Symmetrical without gross deformities. Normal posture. Extremities:  Without edema. Neurologic:  Alert and  oriented x4 Psych:  Alert and cooperative. Normal mood and affect.  Assessment & Plan  RAINY ROTHMAN is a 58 y.o. female presenting today with ***   Follow up   ***Follow up ***    Charmaine Melia, MSN, FNP-BC, AGACNP-BC [facility]

## 2024-08-24 ENCOUNTER — Ambulatory Visit: Admitting: Gastroenterology

## 2024-08-24 ENCOUNTER — Other Ambulatory Visit: Payer: Self-pay | Admitting: Internal Medicine

## 2024-08-25 ENCOUNTER — Ambulatory Visit (HOSPITAL_COMMUNITY)
Admission: RE | Admit: 2024-08-25 | Discharge: 2024-08-25 | Disposition: A | Discharge status: Home / Self Care | Source: Ambulatory Visit | Attending: Internal Medicine | Admitting: Internal Medicine

## 2024-08-25 ENCOUNTER — Ambulatory Visit: Admitting: Internal Medicine

## 2024-08-25 ENCOUNTER — Encounter: Payer: Self-pay | Admitting: Internal Medicine

## 2024-08-25 VITALS — BP 124/69 | HR 94 | Ht 61.0 in | Wt 166.4 lb

## 2024-08-25 DIAGNOSIS — J4489 Other specified chronic obstructive pulmonary disease: Secondary | ICD-10-CM

## 2024-08-25 DIAGNOSIS — F17210 Nicotine dependence, cigarettes, uncomplicated: Secondary | ICD-10-CM | POA: Diagnosis not present

## 2024-08-25 DIAGNOSIS — R911 Solitary pulmonary nodule: Secondary | ICD-10-CM | POA: Insufficient documentation

## 2024-08-25 DIAGNOSIS — J441 Chronic obstructive pulmonary disease with (acute) exacerbation: Secondary | ICD-10-CM

## 2024-08-25 LAB — PULMONARY FUNCTION TEST
DL/VA % pred: 71 %
DL/VA: 3.07 ml/min/mmHg/L
DLCO unc % pred: 52 %
DLCO unc: 9.51 ml/min/mmHg
FEF 25-75 Post: 0.32 L/s
FEF 25-75 Pre: 0.25 L/s
FEF2575-%Change-Post: 24 %
FEF2575-%Pred-Post: 14 %
FEF2575-%Pred-Pre: 11 %
FEV1-%Change-Post: 6 %
FEV1-%Pred-Post: 36 %
FEV1-%Pred-Pre: 34 %
FEV1-Post: 0.84 L
FEV1-Pre: 0.79 L
FEV1FVC-%Change-Post: 4 %
FEV1FVC-%Pred-Pre: 69 %
FEV6-%Change-Post: 3 %
FEV6-%Pred-Post: 48 %
FEV6-%Pred-Pre: 46 %
FEV6-Post: 1.38 L
FEV6-Pre: 1.33 L
FEV6FVC-%Change-Post: 1 %
FEV6FVC-%Pred-Post: 96 %
FEV6FVC-%Pred-Pre: 94 %
FVC-%Change-Post: 1 %
FVC-%Pred-Post: 50 %
FVC-%Pred-Pre: 49 %
FVC-Post: 1.49 L
FVC-Pre: 1.46 L
Post FEV1/FVC ratio: 57 %
Post FEV6/FVC ratio: 93 %
Pre FEV1/FVC ratio: 54 %
Pre FEV6/FVC Ratio: 91 %
RV % pred: 170 %
RV: 3.14 L
TLC % pred: 102 %
TLC: 4.74 L

## 2024-08-25 MED ORDER — ALBUTEROL SULFATE (2.5 MG/3ML) 0.083% IN NEBU
2.5000 mg | INHALATION_SOLUTION | Freq: Once | RESPIRATORY_TRACT | Status: AC
  Administered 2024-08-25: 2.5 mg via RESPIRATORY_TRACT

## 2024-08-25 MED ORDER — PREDNISONE 10 MG PO TABS: ORAL_TABLET | ORAL | 0 refills | Status: DC

## 2024-08-25 MED ORDER — BREZTRI AEROSPHERE 160-9-4.8 MCG/ACT IN AERO: INHALATION_SPRAY | RESPIRATORY_TRACT | 11 refills | Status: AC

## 2024-08-25 MED ORDER — BREZTRI AEROSPHERE 160-9-4.8 MCG/ACT IN AERO: 2.0000 | INHALATION_SPRAY | Freq: Two times a day (BID) | RESPIRATORY_TRACT | Status: AC

## 2024-08-25 NOTE — Assessment & Plan Note (Addendum)
 5  min discussion re active cigarette smoking in addition to office E&M  Ask about tobacco use:   ongoing  Advise quitting   I took an extended  opportunity with this patient to outline the consequences of continued cigarette use  in airway disorders based on all the data we have from the multiple national lung health studies (perfomed over decades at millions of dollars in cost)  indicating that smoking cessation, not choice of inhalers or pulmonary physicians, is the most important aspect of her  care and is the quickest way to see improvement in her CB   Assess willingness:  Not committed at this point Assist in quit attempt:  Per PCP when ready Arrange follow up:   Follow up per Primary Care planned             Each maintenance medication was reviewed in detail including emphasizing most importantly the difference between maintenance and prns and under what circumstances the prns are to be triggered using an action plan format where appropriate.  Total time for H and P, chart review, counseling, reviewing hfa/flutter/ neb device(s) and generating customized AVS unique to this office visit / same day charting = 30 min

## 2024-08-25 NOTE — Patient Instructions (Addendum)
 For cough/ congestion > mucinex or mucinex dm  up to maximum of  1200 mg every 12 hours and use the flutter valve as much as you can    Plan A = Automatic = Always=    Breztri  Take 2 puffs first thing in am and then another 2 puffs about 12 hours later.    Work on inhaler technique:  relax and gently blow all the way out then take a nice smooth full deep breath back in, triggering the inhaler at same time you start breathing in.  Hold breath in for at least  5 seconds if you can. Blow out breztri  thru nose. Rinse and gargle with water  when done.  If mouth or throat bother you at all,  try brushing teeth/gums/tongue with arm and hammer toothpaste/ make a slurry and gargle and spit out.     Prednisone  10 mg take  4 each am x 2 days,   2 each am x 2 days,  1 each am x 2 days and stop     Plan B = Backup (to supplement plan A, not to replace it) Use your albuterol  inhaler as a rescue medication to be used if you can't catch your breath by resting or slowing your pace  or doing a relaxed purse lip breathing pattern.  - The less you use it, the better it will work when you need it. - Ok to use the inhaler up to 2 puffs  every 4 hours if you must but call for appointment if use goes up over your usual need - Don't leave home without it !!  (think of it like the spare tire or starter fluid for your car)    Plan C = Crisis (instead of Plan B but only if Plan B stops working) - only use your albuterol  nebulizer if you first try Plan B and it fails to help > ok to use the nebulizer up to every 4 hours but if start needing it regularly call for immediate appointment   Please schedule a follow up office visit in 4 weeks, call sooner if needed with all medications /inhalers/ solutions in hand so we can verify exactly what you are taking. This includes all medications from all doctors and over the counters

## 2024-08-25 NOTE — Assessment & Plan Note (Addendum)
 Asthmatic bronchitis / GOLD 0 COPD / active smoker - A1AT 12/09/19 >> 184, MM PFT 01/18/20 >> FEV1 1.43 (56%), Ratio  0.70, TLC 4.34 (89%), DLCO 69%, +BD - 05/26/2024  After extensive coaching inhaler device,  effectiveness =  75% hfa/DPI  - 08/25/2024  After extensive coaching inhaler device,  effectiveness =    75% hfa > changed to breztri  / flutter/ pred x 6 d only and f/u in 4 weeks    Group E in terms of symptoms/risk so  laba/lama/ICS  therefore appropriate rx at this point  >>> prefer  breztri  over trelegy given refractory nature of cough  plus  approp SABA prn.  >>> return in 4 weeks with all meds in hand using a trust but verify approach to confirm accurate Medication  Reconciliation The principal here is that until we are certain that the  patients are doing what we've asked, it makes no sense to ask them to do more.

## 2024-08-25 NOTE — Progress Notes (Addendum)
 Jillian [name], female    DOB: April 30, 1966    MRN: [id number]   Brief patient profile:  61  yowf  active smoker  former Sood Patient with AB pt self referred to pulmonary clinic in [place]  05/26/2024  for AB    A1AT 12/09/19 >> 184, MM PFT 01/18/20 >> FEV1 1.43 (56%), Ratio  0.70, TLC 4.34 (89%), DLCO 69%, +BD   Last  [place] OV  10/21/23  History of Present Illness  05/26/2024  Pulmonary/ 1st office eval/ Harpreet Signore / [place] [facility] maint Trelegy / still smoking  Chief Complaint  Patient presents with   COPD    ACUTE: Coughing, tight chest, shob  Dyspnea:  onset Jan 2021 and better on Trelegy  Cough: thick yellow worse in am x severa hours to clear  Sleep: pillows under mattress and big one under head and some  cough at hs but then resolved until recurring each am  SABA use: twice daily  02: none  Patient Instructions  Plan A = Automatic = Always=    Trelegy 100 one click each am  Plan B = Backup (to supplement plan A, not to replace it) Use your albuterol  inhaler as a rescue medication Plan C = Crisis (instead of Plan B but only if Plan B stops working) - only use your albuterol  nebulizer if you first try Plan B The key is to stop smoking completely before smoking completely stops you!  Please schedule a follow up visit in 3 months but call sooner if needed  with all medications /inhalers/ solutions in hand    08/25/2024  f/u ov/[place] office/Sebastian Dzik re: AB  maint on trelegy   still  smoker  did not  bring meds  Chief Complaint  Patient presents with   COPD    Pft 11/11 Non productive cough / shob urinate on herself bc she so tense when she scared she can't catch her breath   Dyspnea:  walking across the room x maybe a year  Cough: rattling cough / tickle  Sleeping: bed is flat with pillows under mattress plus one head pillow  and fades out over several hours and on awakening  SABA use: uses ventolin  and then 2-3 hours p 02: none   Lung cancer screening: due in January     No obvious day to day or daytime variability or assoc excess/ purulent sputum or mucus plugs or hemoptysis or cp or chest tightness, subjective wheeze or overt   hb symptoms.    Also denies any obvious fluctuation of symptoms with weather or environmental changes or other aggravating or alleviating factors except as outlined above   No unusual exposure hx or h/o childhood pna/ asthma or knowledge of premature birth.  Current Allergies, Complete Past Medical History, Past Surgical History, Family History, and Social History were reviewed in [employer] record.  ROS  The following are not active complaints unless bolded Hoarseness, sore throat, dysphagia, dental problems, itching, sneezing,  nasal congestion or discharge of excess mucus or purulent secretions, ear ache,   fever, chills, sweats, unintended wt loss or wt gain, classically pleuritic or exertional cp,  orthopnea pnd or arm/hand swelling  or leg swelling, presyncope, palpitations, abdominal pain, anorexia, nausea, vomiting, diarrhea  or change in bowel habits or change in bladder habits, change in stools or change in urine, dysuria, hematuria,  rash, arthralgias, visual complaints, headache, numbness, weakness or ataxia or problems with walking or coordination,  change in mood or  memory.  Current Meds  Medication Sig   albuterol  (PROVENTIL ) (2.5 MG/3ML) 0.083% nebulizer solution Take 3 mLs (2.5 mg total) by nebulization every 6 (six) hours as needed for wheezing or shortness of breath.   albuterol  (VENTOLIN  HFA) 108 (90 Base) MCG/ACT inhaler INHALE 2 PUFFS INTO THE LUNGS EVERY 4 HOURS AS NEEDED FOR WHEEZING OR SHORTNESS OF BREATH.   Aspirin-Acetaminophen -Caffeine (GOODY HEADACHE PO) Take 1 tablet by mouth as needed.   DULoxetine  (CYMBALTA ) 60 MG capsule TAKE 1 CAPSULE BY MOUTH 2 TIMES DAILY.   famotidine  (PEPCID ) 20 MG tablet Take 1 tablet (20 mg total) by mouth daily.   fluticasone  (FLONASE ) 50  MCG/ACT nasal spray Place 1 spray into both nostrils daily.   Fluticasone -Umeclidin-Vilant (TRELEGY ELLIPTA ) 100-62.5-25 MCG/ACT AEPB Inhale 1 puff into the lungs daily in the afternoon.   Magnesium  200 MG TABS Take 1 tablet (200 mg total) by mouth daily at 12 noon.   metFORMIN  (GLUCOPHAGE ) 500 MG tablet TAKE 1 TABLET BY MOUTH 2 TIMES DAILY WITH A MEAL.   olmesartan  (BENICAR ) 20 MG tablet TAKE 1 TABLET BY MOUTH EVERY DAY   omeprazole  (PRILOSEC) 40 MG capsule TAKE 1 CAPSULE (40 MG TOTAL) BY MOUTH 2 (TWO) TIMES DAILY BEFORE A MEAL.   rosuvastatin  (CRESTOR ) 20 MG tablet TAKE 1 TABLET BY MOUTH EVERY DAY   traZODone  (DESYREL ) 50 MG tablet Take 1.5 tablets (75 mg total) by mouth at bedtime as needed for sleep.   vitamin B-12 (CYANOCOBALAMIN) 1000 MCG tablet Take 1,000 mcg by mouth daily.            Past Medical History:  Diagnosis Date   Allergy 2000,   Bananas, Docecycline   Anxiety    Arthritis    Asthma    Chronic kidney disease    COPD (chronic obstructive pulmonary disease) (HCC)    Depression    Diabetes mellitus without complication (HCC)    GERD (gastroesophageal reflux disease)    High blood pressure    History of bladder problems    Migraines    Sleep apnea    Ulcer       Objective:    Wt Readings from Last 3 Encounters:  08/25/24 166 lb 6.4 oz (75.5 kg)  05/26/24 164 lb (74.4 kg)  05/20/24 164 lb 1.3 oz (74.4 kg)     Vital signs reviewed  08/25/2024  - Note at rest 02 sats  92% on RA   General appearance:    mod obese (by BMI)   amb wf nad/ very congested sounding rattling cough    HEENT : Oropharynx  clear   Nasal turbinates nl    NECK :  without  apparent JVD/ palpable Nodes/TM    LUNGS: no acc muscle use,  Min barrel  contour chest wall with bilateral  slightly decreased bs s audible wheeze and  without cough on insp or exp maneuvers and min  Hyperresonant  to  percussion bilaterally    CV:  RRR  no s3 or murmur or increase in P2, and no edema   ABD:  obese  soft and nontender    MS:  Nl gait/ ext warm without deformities Or obvious joint restrictions  calf tenderness, cyanosis or clubbing     SKIN: warm and dry without lesions    NEURO:  alert, approp, nl sensorium with  no motor or cerebellar deficits apparent.            Assessment   Assessment & Plan COPD with acute exacerbation (HCC)  Lung nodule  Asthmatic bronchitis / GOLD 0 COPD Asthmatic bronchitis / GOLD 0 COPD / active smoker - A1AT 12/09/19 >> 184, MM PFT 01/18/20 >> FEV1 1.43 (56%), Ratio  0.70, TLC 4.34 (89%), DLCO 69%, +BD - 05/26/2024  After extensive coaching inhaler device,  effectiveness =  75% hfa/DPI  - 08/25/2024  After extensive coaching inhaler device,  effectiveness =    75% hfa > changed to breztri  / flutter/ pred x 6 d only and f/u in 4 weeks    Group E in terms of symptoms/risk so  laba/lama/ICS  therefore appropriate rx at this point  >>> prefer  breztri  over trelegy given refractory nature of cough  plus  approp SABA prn.  >>> return in 4 weeks with all meds in hand using a trust but verify approach to confirm accurate Medication  Reconciliation The principal here is that until we are certain that the  patients are doing what we've asked, it makes no sense to ask them to do more.    Cigarette smoker 5  min discussion re active cigarette smoking in addition to office E&M  Ask about tobacco use:   ongoing  Advise quitting   I took an extended  opportunity with this patient to outline the consequences of continued cigarette use  in airway disorders based on all the data we have from the multiple national lung health studies (perfomed over decades at millions of dollars in cost)  indicating that smoking cessation, not choice of inhalers or pulmonary physicians, is the most important aspect of her  care and is the quickest way to see improvement in her CB   Assess willingness:  Not committed at this point Assist in quit attempt:  Per PCP when  ready Arrange follow up:   Follow up per Primary Care planned             Each maintenance medication was reviewed in detail including emphasizing most importantly the difference between maintenance and prns and under what circumstances the prns are to be triggered using an action plan format where appropriate.  Total time for H and P, chart review, counseling, reviewing hfa/flutter/ neb device(s) and generating customized AVS unique to this office visit / same day charting = 30 min         AVS  Patient Instructions  For cough/ congestion > mucinex or mucinex dm  up to maximum of  1200 mg every 12 hours and use the flutter valve as much as you can    Plan A = Automatic = Always=    Breztri  Take 2 puffs first thing in am and then another 2 puffs about 12 hours later.    Work on inhaler technique:  relax and gently blow all the way out then take a nice smooth full deep breath back in, triggering the inhaler at same time you start breathing in.  Hold breath in for at least  5 seconds if you can. Blow out breztri  thru nose. Rinse and gargle with water  when done.  If mouth or throat bother you at all,  try brushing teeth/gums/tongue with arm and hammer toothpaste/ make a slurry and gargle and spit out.     Prednisone  10 mg take  4 each am x 2 days,   2 each am x 2 days,  1 each am x 2 days and stop     Plan B = Backup (to supplement plan A, not to replace it) Use your albuterol  inhaler as a  rescue medication to be used if you can't catch your breath by resting or slowing your pace  or doing a relaxed purse lip breathing pattern.  - The less you use it, the better it will work when you need it. - Ok to use the inhaler up to 2 puffs  every 4 hours if you must but call for appointment if use goes up over your usual need - Don't leave home without it !!  (think of it like the spare tire or starter fluid for your car)    Plan C = Crisis (instead of Plan B but only if Plan B stops working) -  only use your albuterol  nebulizer if you first try Plan B and it fails to help > ok to use the nebulizer up to every 4 hours but if start needing it regularly call for immediate appointment   Please schedule a follow up office visit in 4 weeks, call sooner if needed with all medications /inhalers/ solutions in hand so we can verify exactly what you are taking. This includes all medications from all doctors and over the counters       Ozell America, MD 08/25/2024

## 2024-08-26 ENCOUNTER — Ambulatory Visit: Payer: Self-pay | Admitting: Internal Medicine

## 2024-08-26 ENCOUNTER — Encounter: Payer: Self-pay | Admitting: Internal Medicine

## 2024-08-26 LAB — CBC WITH DIFFERENTIAL/PLATELET
Basophils Absolute: 0.1 x10E3/uL (ref 0.0–0.2)
Basos: 1 %
EOS (ABSOLUTE): 1.2 x10E3/uL — ABNORMAL HIGH (ref 0.0–0.4)
Eos: 10 %
Hematocrit: 41 % (ref 34.0–46.6)
Hemoglobin: 12.3 g/dL (ref 11.1–15.9)
Immature Grans (Abs): 0 x10E3/uL (ref 0.0–0.1)
Immature Granulocytes: 0 %
Lymphocytes Absolute: 2.2 x10E3/uL (ref 0.7–3.1)
Lymphs: 18 %
MCH: 26.5 pg — ABNORMAL LOW (ref 26.6–33.0)
MCHC: 30 g/dL — ABNORMAL LOW (ref 31.5–35.7)
MCV: 88 fL (ref 79–97)
Monocytes Absolute: 0.9 x10E3/uL (ref 0.1–0.9)
Monocytes: 7 %
Neutrophils Absolute: 8 x10E3/uL — ABNORMAL HIGH (ref 1.4–7.0)
Neutrophils: 64 %
Platelets: 250 x10E3/uL (ref 150–450)
RBC: 4.64 x10E6/uL (ref 3.77–5.28)
RDW: 16.2 % — ABNORMAL HIGH (ref 11.7–15.4)
WBC: 12.4 x10E3/uL — ABNORMAL HIGH (ref 3.4–10.8)

## 2024-08-26 LAB — CMP14+EGFR
ALT: 15 IU/L (ref 0–32)
AST: 13 IU/L (ref 0–40)
Albumin: 3.8 g/dL (ref 3.8–4.9)
Alkaline Phosphatase: 81 IU/L (ref 49–135)
BUN/Creatinine Ratio: 20 (ref 12–28)
BUN: 22 mg/dL (ref 8–27)
Bilirubin Total: <0.2 mg/dL (ref 0.0–1.2)
CO2: 21 mmol/L (ref 20–29)
Calcium: 8.8 mg/dL (ref 8.7–10.3)
Chloride: 105 mmol/L (ref 96–106)
Creatinine, Ser: 1.1 mg/dL — ABNORMAL HIGH (ref 0.57–1.00)
Globulin, Total: 1.9 g/dL (ref 1.5–4.5)
Glucose: 141 mg/dL — ABNORMAL HIGH (ref 70–99)
Potassium: 4.7 mmol/L (ref 3.5–5.2)
Sodium: 143 mmol/L (ref 134–144)
Total Protein: 5.7 g/dL — ABNORMAL LOW (ref 6.0–8.5)
eGFR: 58 mL/min/1.73 — ABNORMAL LOW (ref >59)

## 2024-08-26 LAB — HEMOGLOBIN A1C
Est. average glucose Bld gHb Est-mCnc: 146 mg/dL
Hgb A1c MFr Bld: 6.7 % — ABNORMAL HIGH (ref 4.8–5.6)

## 2024-08-26 LAB — TSH+FREE T4
Free T4: 0.69 ng/dL — ABNORMAL LOW (ref 0.82–1.77)
TSH: 1.84 u[IU]/mL (ref 0.450–4.500)

## 2024-08-30 ENCOUNTER — Ambulatory Visit: Payer: Self-pay | Admitting: Internal Medicine

## 2024-08-30 ENCOUNTER — Encounter: Payer: Self-pay | Admitting: Internal Medicine

## 2024-08-31 NOTE — Progress Notes (Signed)
Spoke with pt and notified of results per Dr. Wert. Pt verbalized understanding and denied any questions. 

## 2024-09-01 ENCOUNTER — Ambulatory Visit: Payer: Self-pay | Admitting: Internal Medicine

## 2024-09-01 ENCOUNTER — Encounter: Payer: Self-pay | Admitting: Internal Medicine

## 2024-09-01 ENCOUNTER — Ambulatory Visit: Admitting: Internal Medicine

## 2024-09-01 ENCOUNTER — Telehealth: Payer: Self-pay

## 2024-09-01 NOTE — Telephone Encounter (Signed)
 FYI Only or Action Required?: FYI only for provider: appointment scheduled on 11/19.  Patient is followed in Pulmonology for COPD, last seen on 08/25/2024 by Jillian Ozell NOVAK, MD.  Called Nurse Triage reporting Shortness of Breath.  Symptoms began several weeks ago.  Interventions attempted: OTC medications: Mucinex, Maintenance inhaler, and Nebulizer treatments.  Symptoms are: gradually worsening.  Triage Disposition: See HCP Within 4 Hours (Or PCP Triage)  Patient/caregiver understands and will follow disposition?: Yes E2C2 Pulmonary Triage - Initial Assessment Questions "Chief Complaint (e.g., cough, sob, wheezing, fever, chills, sweat or additional symptoms) *Go to specific symptom protocol after initial questions. SOB, Dizzy, Anxiety  "How long have symptoms been present?" awhile  Have you tested for COVID or Flu? Note: If not, ask patient if a home test can be taken. If so, instruct patient to call back for positive results. No  MEDICINES:   "Have you used any OTC meds to help with symptoms?" Yes If yes, ask "What medications?" Mucinex  "Have you used your inhalers/maintenance medication?" Yes If yes, "What medications?" Breztri  and Nebs  If inhaler, ask "How many puffs and how often?" Note: Review instructions on medication in the chart. Breztri  BID, Nebs q4h   OXYGEN: "Do you wear supplemental oxygen?" No If yes, "How many liters are you supposed to use?" na  "Do you monitor your oxygen levels?" Yes If yes, What is your reading (oxygen level) today? 88-94%  What is your usual oxygen saturation reading?  (Note: Pulmonary O2 sats should be 90% or greater) 92%  Copied from CRM #[id number]. Topic: Clinical - Red Word Triage >> Sep 01, 2024  2:59 PM Jillian [name] wrote: Red Word that prompted transfer to Nurse Triage: Patient [phone] states called earlier today and sent a message through Mychart.   Patient's Mychart today: I really need someone to call me  back. I had to cancel my appointment today Nov 18th 2025 @ 10:30, unable to drive. Left message with front desk for Dr. Darlean. Anxiety/Stress level is high. I now feel as if I'm not using my inhalers right and getting the meds that I need. My head feels like it has a belt around it and its so tight if feels like its going to explode. I can blow and alot comes out and feels better, but fills back up fast. My primary care Dr. gives me Amoxicillan for this. I need help to quit smoking. I've tried and failed millions of times. I need to be able to relax and hopefully can succeed and quit smoking, thats the reason I asked for the Valium. Please help me. Please let me know something today.   Patient states is not feeling suicidal, just feeling really down and needs help. Patient states head congestion, drainage, painful and tightness in the head, feels like it's been on bricks, sometimes feels like her heartbeats in the head, blood pressure is not high it's good level, dizziness, shortness of breath worsen this past week. Patient feel anxiety and stress level is very high and cannot get out of her head, constant worry. Patient denies a fever. Patient states her insurance will not cover televisit and really needs to be seen. Please advise. Reason for Disposition  [1] Longstanding difficulty breathing (e.g., CHF, COPD, emphysema) AND [2] WORSE than normal  Answer Assessment - Initial Assessment Questions Pt had to cancel PFT follow up today due to worsened breathing and dizziness/full headed sensation and not feeling safe to drive. She wanted to do a Video visit but  her insurance wont cover.  She had been having ongoing sinus congestion, head pain and cough. She will eventually clear out her sinuses and then it slowly comes back. Feels like it makes her breathing worse at times. Head feels like a belt around it- Mucinex helps but uses often.  Dizziness happens randoming- spinning tilting sensation, occurs even when  laying down in bed. Intermittent.  Confused how provider wanted her to use her Nebulizer- last use 1hr prior to call. Was up making food and felt her breathing go tight. Helps some Pulse Ox- 88%-94% and HR 87-89bpm. Patient hadnt been monitoring but was able to check while on the call. Advised to carry it will her and keep track of when it is worsening, when she does treatments, and how long it takes her to recover on her dips. She understands.   Stress about everything and cigarettes are her comfort - looking for something to help keep her calm - Requesting Valium  FU appt made with Dr Jillian for 11/19- patient will try to find a ride or tough it out. ED/UC precautions given. Advised EMS if O2 is not coming up or concerned.   1. RESPIRATORY STATUS: Describe your breathing? (e.g., wheezing, shortness of breath, unable to speak, severe coughing)      Short of breath and lungs tight with any activity 2. ONSET: When did this breathing problem begin?      ongoing 3. PATTERN Does the difficult breathing come and go, or has it been constant since it started?      Worse with activity 4. SEVERITY: How bad is your breathing? (e.g., mild, moderate, severe)      Mild-moderate 5. RECURRENT SYMPTOM: Have you had difficulty breathing before? If Yes, ask: When was the last time? and What happened that time?      Was on O2 at one point  6. CARDIAC HISTORY: Do you have any history of heart disease? (e.g., heart attack, angina, bypass surgery, angioplasty)      HTN 7. LUNG HISTORY: Do you have any history of lung disease?  (e.g., pulmonary embolus, asthma, emphysema)     OSA, COPD 8. CAUSE: What do you think is causing the breathing problem?      COPD- sinus congestion  9. OTHER SYMPTOMS: Do you have any other symptoms? (e.g., chest pain, cough, dizziness, fever, runny nose)     Congestion, dizziness 10. O2 SATURATION MONITOR:  Do you use an oxygen saturation monitor (pulse oximeter) at  home? If Yes, ask: What is your reading (oxygen level) today? What is your usual oxygen saturation reading? (e.g., 95%)       88-94%  Answer Assessment - Initial Assessment Questions 1. CONCERN: Did anything happen that prompted you to call today?      Anxiety over health concerns  2. ANXIETY SYMPTOMS: Can you describe how you (your loved one; patient) have been feeling? (e.g., tense, restless, panicky, anxious, keyed up, overwhelmed, sense of impending doom).      Restless, panicky, anxious 3. ONSET: How long have you been feeling this way? (e.g., hours, days, weeks)     Ongoing  4. SEVERITY: How would you rate the level of anxiety? (e.g., 0 - 10; or mild, moderate, severe).     Mod-severe 5. FUNCTIONAL IMPAIRMENT: How have these feelings affected your ability to do daily activities? Have you had more difficulty than usual doing your normal daily activities? (e.g., getting better, same, worse; self-care, school, work, interactions)     Hard  to go about her day. introvert 6. HISTORY: Have you felt this way before? Have you ever been diagnosed with an anxiety problem in the past? (e.g., generalized anxiety disorder, panic attacks, PTSD). If Yes, ask: How was this problem treated? (e.g., medicines, counseling, etc.)     Longstanding anxiety 7. RISK OF HARM - SUICIDAL IDEATION: Do you ever have thoughts of hurting or killing yourself? If Yes, ask:  Do you have these feelings now? Do you have a plan on how you would do this?     Denies 8. TREATMENT:  What has been done so far to treat this anxiety? (e.g., medicines, relaxation strategies). What has helped?     cigarettes 9. THERAPIST: Do you have a counselor or therapist? If Yes, ask: What is their name?     Would need someone that  10. POTENTIAL TRIGGERS: Do you drink caffeinated beverages (e.g., coffee, colas, teas), and how much daily? Do you drink alcohol or use any drugs? Have you started any new  medicines recently?       Health, talking to people 11. PATIENT SUPPORT: Who is with you now? Who do you live with? Do you have family or friends who you can talk to?        Doesn't have any 12. OTHER SYMPTOMS: Do you have any other symptoms? (e.g., feeling depressed, trouble concentrating, trouble sleeping, trouble breathing, palpitations or fast heartbeat, chest pain, sweating, nausea, or diarrhea)        SOB with COPD  Protocols used: Breathing Difficulty-A-AH, Anxiety and Panic Attack-A-AH

## 2024-09-01 NOTE — Progress Notes (Deleted)
 Jillian [name], female    DOB: 1966-07-15    MRN: [id number]   Brief patient profile:  19  yowf  active smoker  former [facility] Patient with AB pt self referred to pulmonary clinic in [place]  05/26/2024  for AB    A1AT 12/09/19 >> 184, MM PFT 01/18/20 >> FEV1 1.43 (56%), Ratio  0.70, TLC 4.34 (89%), DLCO 69%, +BD      History of Present Illness  05/26/2024  Pulmonary/ 1st office eval/ [facility] Ros[facility] / [facility] maint Trelegy / still smoking  Chief Complaint  Patient presents with   COPD    ACUTE: Coughing, tight chest, shob  Dyspnea:  onset Jan 2021 and better on Trelegy  Cough: thick yellow worse in am x severa hours to clear  Sleep: pillows under mattress and big one under head and some  cough at hs but then resolved until recurring each am  SABA use: twice daily  02: none  Patient Instructions  Plan A = Automatic = Always=    Trelegy 100 one click each am  Plan B = Backup (to supplement plan A, not to replace it) Use your albuterol  inhaler as a rescue medication Plan C = Crisis (instead of Plan B but only if Plan B stops working) - only use your albuterol  nebulizer if you first try Plan B The key is to stop smoking completely before smoking completely stops you!  Please schedule a follow up visit in 3 months but call sooner if needed  with all medications /inhalers/ solutions in hand    08/25/2024  f/u ov/[place] [facility]/Lawrencia Mauney re: AB  maint on trelegy   still  smoker  did not  bring meds  Chief Complaint  Patient presents with   COPD    Pft 11/11 Non productive cough / shob urinate on herself bc she so tense when she scared she can't catch her breath   Dyspnea:  walking across the room x maybe a year  Cough: rattling cough / tickle  Sleeping: bed is flat with pillows under mattress plus one head pillow  and fades out over several hours and on awakening  SABA use: uses ventolin  and then 2-3 hours p 02: none  Lung cancer screening: due in January  Patient Instructions   For cough/ congestion > mucinex or mucinex dm  up to maximum of  1200 mg every 12 hours and use the flutter valve as much as you can   Plan A = Automatic = Always=    Breztri  Take 2 puffs first thing in am and then another 2 puffs about 12 hours later.  Work on inhaler technique: Prednisone  10 mg take  4 each am x 2 days,   2 each am x 2 days,  1 each am x 2 days and stop   Plan B = Backup (to supplement plan A, not to replace it) Use your albuterol  inhaler as a rescue medication  Plan C = Crisis (instead of Plan B but only if Plan B stops working) - only use your albuterol  nebulizer if you first try Plan B Please schedule a follow up office visit in 4 weeks, call sooner if needed with all medications /inhalers/ solutions in hand     09/01/2024  f/u ov/[place] office/Jemal Miskell re: AB maint on ***  No chief complaint on file.   Dyspnea:  *** Cough: *** Sleeping: ***   resp cc  SABA use: *** 02: ***  Lung cancer screening: ***   No  obvious day to day or daytime variability or assoc excess/ purulent sputum or mucus plugs or hemoptysis or cp or chest tightness, subjective wheeze or overt sinus or hb symptoms.    Also denies any obvious fluctuation of symptoms with weather or environmental changes or other aggravating or alleviating factors except as outlined above   No unusual exposure hx or h/o childhood pna/ asthma or knowledge of premature birth.  Current Allergies, Complete Past Medical History, Past Surgical History, Family History, and Social History were reviewed in [employer] record.  ROS  The following are not active complaints unless bolded Hoarseness, sore throat, dysphagia, dental problems, itching, sneezing,  nasal congestion or discharge of excess mucus or purulent secretions, ear ache,   fever, chills, sweats, unintended wt loss or wt gain, classically pleuritic or exertional cp,  orthopnea pnd or arm/hand swelling  or leg swelling, presyncope,  palpitations, abdominal pain, anorexia, nausea, vomiting, diarrhea  or change in bowel habits or change in bladder habits, change in stools or change in urine, dysuria, hematuria,  rash, arthralgias, visual complaints, headache, numbness, weakness or ataxia or problems with walking or coordination,  change in mood or  memory.        No outpatient medications have been marked as taking for the 09/01/24 encounter (Appointment) with Jillian Ozell NOVAK, MD.                Past Medical History:  Diagnosis Date   Allergy 2000,   Bananas, Docecycline   Anxiety    Arthritis    Asthma    Chronic kidney disease    COPD (chronic obstructive pulmonary disease) (HCC)    Depression    Diabetes mellitus without complication (HCC)    GERD (gastroesophageal reflux disease)    High blood pressure    History of bladder problems    Migraines    Sleep apnea    Ulcer       Objective:    Wts   09/01/2024      ***   08/25/24 166 lb 6.4 oz (75.5 kg)  05/26/24 164 lb (74.4 kg)  05/20/24 164 lb 1.3 oz (74.4 kg)    Vital signs reviewed  09/01/2024  - Note at rest 02 sats  ***% on ***   General appearance:    ***   Min bar ***           Assessment

## 2024-09-01 NOTE — Telephone Encounter (Signed)
 Mychart msg:   I really need someone to call me back. I had to cancel my appointment today Nov 18th 2025 @ 10:30, unable to drive. Left message with front desk for Dr. Darlean. Anxiety/Stress level is high. I now feel as if I'm not using my inhalers right and getting the meds that I need. My head feels like it has a belt around it and its so tight if feels like its going to explode. I can blow and alot comes out and feels better, but fills back up fast. My primary care Dr. gives me Amoxicillan for this. I need help to quit smoking. I've tried and failed millions of times. I need to be able to relax and hopefully can succeed and quit smoking, thats the reason I asked for the Valium. Please help me. Please let me know something today. Please    Please advise

## 2024-09-01 NOTE — Telephone Encounter (Signed)
 Copied from CRM #[id number]. Topic: Clinical - Medical Advice >> Sep 01, 2024  8:04 AM Joesph PARAS wrote: Reason for CRM: Patient is requesting valium to address her nerves and anxiety to assist he with quitting smoking. States the prednisone  and welbutrin have not helped her. Patient would like to keep appointment but is scared to drive, citing a stopped up head. Insurance will not cover tele visit at this time.

## 2024-09-02 ENCOUNTER — Encounter: Payer: Self-pay | Admitting: Internal Medicine

## 2024-09-02 ENCOUNTER — Other Ambulatory Visit: Payer: Self-pay | Admitting: Internal Medicine

## 2024-09-02 ENCOUNTER — Encounter

## 2024-09-02 ENCOUNTER — Ambulatory Visit (INDEPENDENT_AMBULATORY_CARE_PROVIDER_SITE_OTHER): Admitting: Internal Medicine

## 2024-09-02 VITALS — BP 146/66 | HR 94 | Ht 61.0 in | Wt 163.0 lb

## 2024-09-02 DIAGNOSIS — J449 Chronic obstructive pulmonary disease, unspecified: Secondary | ICD-10-CM

## 2024-09-02 DIAGNOSIS — F17210 Nicotine dependence, cigarettes, uncomplicated: Secondary | ICD-10-CM | POA: Diagnosis not present

## 2024-09-02 MED ORDER — BUDESONIDE 0.25 MG/2ML IN SUSP: RESPIRATORY_TRACT | 12 refills | Status: DC

## 2024-09-02 MED ORDER — AMOXICILLIN-POT CLAVULANATE 875-125 MG PO TABS: 1.0000 | ORAL_TABLET | Freq: Two times a day (BID) | ORAL | 0 refills | Status: DC

## 2024-09-02 MED ORDER — PREDNISONE 10 MG PO TABS: ORAL_TABLET | ORAL | 0 refills | Status: DC

## 2024-09-02 NOTE — Patient Instructions (Addendum)
 Until are better and stay better off prednisone  :  Use Nebulizer albuterol / budesonide  0.25 mg twice daily automatically   Breztri  should be continued Take 2 puffs after your nebulizer and then another 2 puffs about 12 hours later also after your nebulizer   Prendisone  10 mg Take 4 for three days 3 for three days 2 for three days 1 for three days and stop   Augmentin  875 mg take one pill twice daily  X 10 days - take at breakfast and supper with large glass of water .  It would help reduce the usual side effects (diarrhea and yeast infections) if you ate cultured yogurt at lunch.   Please schedule a follow up office visit in 4 weeks, sooner if needed - bring resp medications

## 2024-09-02 NOTE — Assessment & Plan Note (Addendum)
 Asthmatic bronchitis / GOLD 0 COPD / active smoker/MM - A1AT 12/09/19 >> 184, MM PFT 01/18/20 >> FEV1 1.43 (56%), Ratio  0.70, TLC 4.34 (89%), DLCO 69%, +BD - 05/26/2024  After extensive coaching inhaler device,  effectiveness =  75% hfa/DPI  - 08/25/2024  After extensive coaching inhaler device,  effectiveness =    75% hfa > changed to breztri  / flutter/ pred x 6 d only and f/u in 4 weeks  - PFT's  08/25/24  FEV1 0.84 (36 % ) ratio 0.57  p6 % improvement from saba p 0 prior to study with DLCO  9.5 (52%)   and FV curve classically concave and ERV 33 % at wt 164  08/25/24 - 09/02/2024  After extensive coaching inhaler device,  effectiveness =    50% hfa so add albuterol / bud 0.25 mg bid x one month and pred x 12 days plus augmentin  x 10 d for possible sinusits  DDX of  difficult airways management almost all start with A and  include Adherence, Ace Inhibitors, Acid Reflux, Active Sinus Disease, Alpha 1 Antitripsin deficiency, Anxiety masquerading as Airways dz,  ABPA,  Allergy(esp in young), Aspiration (esp in elderly), Adverse effects of meds,  Active smoking or vaping, A bunch of PE's (a small clot burden can't cause this syndrome unless there is already severe underlying pulm or vascular dz with poor reserve) plus two Bs  = Bronchiectasis and Beta blocker use..and one C= CHF   Most likely dx's bolded   Adherence is always the initial prime suspect and is a multilayered concern that requires a trust but verify approach in every patient - starting with knowing how to use medications, especially inhalers, correctly, keeping up with refills and understanding the fundamental difference between maintenance and prns vs those medications only taken for a very short course and then stopped and not refilled.  -  09/02/2024  After extensive coaching inhaler device,  effectiveness =    50% at best so supplement with albuterol / budesonine same combo as in [employer] until able to get better and stay better off  prednisone  - return Adherence is always the initial prime suspect and is a multilayered concern that requires a trust but verify approach in every patient - starting with knowing how to use medications, especially inhalers, correctly, keeping up with refills and understanding the fundamental difference between maintenance and prns vs those medications only taken for a very short course and then stopped and not refilled. 3  Allergy  > pred x 12 d  Active sinus dz >augmentin  x 10 d  Anxiety > defer to Dr Tobie   Active smoking > see sep a/p

## 2024-09-02 NOTE — Assessment & Plan Note (Addendum)
 4   min discussion re active cigarette smoking in addition to office E&M  Ask about tobacco use:   ongoing Advise quitting   I took an extended  opportunity with this patient to outline the consequences of continued cigarette use  in airway disorders based on all the data we have from the multiple national lung health studies (perfomed over decades at millions of dollars in cost)  indicating that smoking cessation, not choice of inhalers or pulmonary physicians, is the most important aspect of her  care and she would likely see rapid improvement  in active symptoms once stops completely (x for anxiety which will need to be separately addressed by PCP.   Assess willingness:  Not committed at this point Assist in quit attempt:  Per PCP when ready Arrange follow up:   Follow up per Primary Care planned          Each maintenance medication was reviewed in detail including emphasizing most importantly the difference between maintenance and prns and under what circumstances the prns are to be triggered using an action plan format where appropriate.  Total time for H and P, chart review, counseling, reviewing hfa/ neb device(s) and generating customized AVS unique to this office visit / same day charting = 40 min for multiple acute and chronic  refractory respiratory  symptoms of uncertain etiology

## 2024-09-02 NOTE — Progress Notes (Signed)
 Jillian [name], female    DOB: [birth year]    MRN: [id number]   Brief patient profile:  58  yowf  active smoker/MM    former [facility] Patient with AB pt self referred to pulmonary clinic in [place]  05/26/2024  for AB    A1AT 12/09/19 >> 184, MM PFT 01/18/20 >> FEV1 1.43 (56%), Ratio  0.70, TLC 4.34 (89%), DLCO 69%, +BD      History of Present Illness  05/26/2024  Pulmonary/ 1st office eval/ Jobie Popp / [place] [facility] maint Trelegy / still smoking  Chief Complaint  Patient presents with   COPD    ACUTE: Coughing, tight chest, shob  Dyspnea:  onset Jan 2021 and better on Trelegy  Cough: thick yellow worse in am x severa hours to clear  Sleep: pillows under mattress and big one under head and some  cough at hs but then resolved until recurring each am  SABA use: twice daily  02: none  Patient Instructions  Plan A = Automatic = Always=    Trelegy 100 one click each am  Plan B = Backup (to supplement plan A, not to replace it) Use your albuterol  inhaler as a rescue medication Plan C = Crisis (instead of Plan B but only if Plan B stops working) - only use your albuterol  nebulizer if you first try Plan B The key is to stop smoking completely before smoking completely stops you!  Please schedule a follow up visit in 3 months but call sooner if needed  with all medications /inhalers/ solutions in hand    08/25/2024  f/u ov/[place] [facility]/Huey Scalia re: AB  maint on trelegy   still  smoker  did not  bring meds  Chief Complaint  Patient presents with   COPD    Pft 11/11 Non productive cough / shob urinate on herself bc she so tense when she scared she can't catch her breath   Dyspnea:  walking across the room x maybe a year  Cough: rattling cough / tickle  Sleeping: bed is flat with pillows under mattress plus one head pillow  and fades out over several hours and on awakening  SABA use: uses ventolin  and then 2-3 hours p 02: none  Lung cancer screening: due in January  Patient  Instructions  For cough/ congestion > mucinex or mucinex dm  up to maximum of  1200 mg every 12 hours and use the flutter valve as much as you can   Plan A = Automatic = Always=    Breztri  Take 2 puffs first thing in am and then another 2 puffs about 12 hours later.  Work on inhaler technique: Prednisone  10 mg take  4 each am x 2 days,   2 each am x 2 days,  1 each am x 2 days and stop   Plan B = Backup (to supplement plan A, not to replace it) Use your albuterol  inhaler as a rescue medication  Plan C = Crisis (instead of Plan B but only if Plan B stops working) - only use your albuterol  nebulizer if you first try Plan B Please schedule a follow up office visit in 4 weeks, call sooner if needed with all medications /inhalers/ solutions in hand     09/02/2024  ACUTE  ov/[place] office/Janis Cuffe re: AB maint on breztri    active  smoker  Chief Complaint  Patient presents with   Acute Visit    Med management - states using inhalers wrong -  Coughing with  yellow-clear mucus - DOE - Wants to quit smoking   Dyspnea:  across the room  Cough: smoker's rattle mucus turning more yellow asssoc with more nasal congestoin and 'sinus HA   Sleeping: bed is flat/ lots of pillows with some noct cough as well as flares in am  SABA use: twice daily hfa/ nebulizer  02: none   Lung cancer screening: q January    No obvious day to day or daytime variability or assoc   mucus plugs or hemoptysis or cp or chest tightness, subjective wheeze or overt s  hb symptoms.    Also denies any obvious fluctuation of symptoms with weather or environmental changes or other aggravating or alleviating factors except as outlined above   No unusual exposure hx or h/o childhood pna/ asthma or knowledge of premature birth.  Current Allergies, Complete Past Medical History, Past Surgical History, Family History, and Social History were reviewed in [employer] record.  ROS  The following are not active  complaints unless bolded Hoarseness, sore throat, dysphagia, dental problems, itching, sneezing,  nasal congestion or discharge of excess mucus or purulent secretions, ear ache,   fever, chills, sweats, unintended wt loss or wt gain, classically pleuritic or exertional cp,  orthopnea pnd or arm/hand swelling  or leg swelling, presyncope, palpitations, abdominal pain, anorexia, nausea, vomiting, diarrhea  or change in bowel habits or change in bladder habits, change in stools or change in urine, dysuria, hematuria,  rash, arthralgias, visual complaints, headache, numbness, weakness or ataxia or problems with walking or coordination,  change in mood or  memory.        Current Meds  Medication Sig   albuterol  (PROVENTIL ) (2.5 MG/3ML) 0.083% nebulizer solution Take 3 mLs (2.5 mg total) by nebulization every 6 (six) hours as needed for wheezing or shortness of breath.   albuterol  (VENTOLIN  HFA) 108 (90 Base) MCG/ACT inhaler INHALE 2 PUFFS INTO THE LUNGS EVERY 4 HOURS AS NEEDED FOR WHEEZING OR SHORTNESS OF BREATH.   Aspirin-Acetaminophen -Caffeine (GOODY HEADACHE PO) Take 1 tablet by mouth as needed.   budesonide-glycopyrrolate-formoterol (BREZTRI  AEROSPHERE) 160-9-4.8 MCG/ACT AERO inhaler Take 2 puffs first thing in am and then another 2 puffs about 12 hours later.   DULoxetine  (CYMBALTA ) 60 MG capsule TAKE 1 CAPSULE BY MOUTH 2 TIMES DAILY.   famotidine  (PEPCID ) 20 MG tablet Take 1 tablet (20 mg total) by mouth daily.   fluticasone  (FLONASE ) 50 MCG/ACT nasal spray Place 1 spray into both nostrils daily.   Magnesium  200 MG TABS Take 1 tablet (200 mg total) by mouth daily at 12 noon.   metFORMIN  (GLUCOPHAGE ) 500 MG tablet TAKE 1 TABLET BY MOUTH 2 TIMES DAILY WITH A MEAL.   olmesartan  (BENICAR ) 20 MG tablet TAKE 1 TABLET BY MOUTH EVERY DAY   omeprazole  (PRILOSEC) 40 MG capsule TAKE 1 CAPSULE (40 MG TOTAL) BY MOUTH 2 (TWO) TIMES DAILY BEFORE A MEAL.   predniSONE  (DELTASONE ) 10 MG tablet Take  4 each am x 2  days,   2 each am x 2 days,  1 each am x 2 days and stop   rosuvastatin  (CRESTOR ) 20 MG tablet TAKE 1 TABLET BY MOUTH EVERY DAY   traZODone  (DESYREL ) 50 MG tablet Take 1.5 tablets (75 mg total) by mouth at bedtime as needed for sleep.   vitamin B-12 (CYANOCOBALAMIN) 1000 MCG tablet Take 1,000 mcg by mouth daily.                Past Medical History:  Diagnosis  Date   Allergy 2000,   Bananas, Docecycline   Anxiety    Arthritis    Asthma    Chronic kidney disease    COPD (chronic obstructive pulmonary disease) (HCC)    Depression    Diabetes mellitus without complication (HCC)    GERD (gastroesophageal reflux disease)    High blood pressure    History of bladder problems    Migraines    Sleep apnea    Ulcer       Objective:    Wts   09/02/2024      163    08/25/24 166 lb 6.4 oz (75.5 kg)  05/26/24 164 lb (74.4 kg)  05/20/24 164 lb 1.3 oz (74.4 kg)    Vital signs reviewed  09/02/2024  - Note at rest 02 sats  93% on RA   General appearance:    amb anxious wf no increased wob    HEENT : Oropharynx  clear   Nasal turbinates mod edema with slt mp secretions    NECK :  without  apparent JVD/ palpable Nodes/TM    LUNGS: no acc muscle use,  Min barrel  contour chest wall with bilateral  slightly decreased bs s audible wheeze and  without cough on insp or exp maneuvers and min  Hyperresonant  to  percussion bilaterally    CV:  RRR  no s3 or murmur or increase in P2, and no edema   ABD:  soft and nontender    MS:  Nl gait/ ext warm without deformities Or obvious joint restrictions  calf tenderness, cyanosis or clubbing     SKIN: warm and dry without lesions    NEURO:  alert, approp, nl sensorium with  no motor or cerebellar deficits apparent.            Assessment     Assessment & Plan COPD GOLD 3/ AB Asthmatic bronchitis / GOLD 0 COPD / active smoker/MM - A1AT 12/09/19 >> 184, MM PFT 01/18/20 >> FEV1 1.43 (56%), Ratio  0.70, TLC 4.34 (89%), DLCO 69%, +BD -  05/26/2024  After extensive coaching inhaler device,  effectiveness =  75% hfa/DPI  - 08/25/2024  After extensive coaching inhaler device,  effectiveness =    75% hfa > changed to breztri  / flutter/ pred x 6 d only and f/u in 4 weeks  - PFT's  08/25/24  FEV1 0.84 (36 % ) ratio 0.57  p6 % improvement from saba p 0 prior to study with DLCO  9.5 (52%)   and FV curve classically concave and ERV 33 % at wt 164  08/25/24 - 09/02/2024  After extensive coaching inhaler device,  effectiveness =    50% hfa so add albuterol / bud 0.25 mg bid x one month and pred x 12 days plus augmentin  x 10 d for possible sinusits  DDX of  difficult airways management almost all start with A and  include Adherence, Ace Inhibitors, Acid Reflux, Active Sinus Disease, Alpha 1 Antitripsin deficiency, Anxiety masquerading as Airways dz,  ABPA,  Allergy(esp in young), Aspiration (esp in elderly), Adverse effects of meds,  Active smoking or vaping, A bunch of PE's (a small clot burden can't cause this syndrome unless there is already severe underlying pulm or vascular dz with poor reserve) plus two Bs  = Bronchiectasis and Beta blocker use..and one C= CHF   Most likely dx's bolded   Adherence is always the initial prime suspect and is a multilayered concern that requires a trust but  verify approach in every patient - starting with knowing how to use medications, especially inhalers, correctly, keeping up with refills and understanding the fundamental difference between maintenance and prns vs those medications only taken for a very short course and then stopped and not refilled.  -  09/02/2024  After extensive coaching inhaler device,  effectiveness =    50% at best so supplement with albuterol / budesonine same combo as in [employer] until able to get better and stay better off prednisone  - return Adherence is always the initial prime suspect and is a multilayered concern that requires a trust but verify approach in every patient -  starting with knowing how to use medications, especially inhalers, correctly, keeping up with refills and understanding the fundamental difference between maintenance and prns vs those medications only taken for a very short course and then stopped and not refilled. 3  Allergy  > pred x 12 d  Active sinus dz >augmentin  x 10 d  Anxiety > defer to Dr Tobie   Active smoking > see sep a/p Cigarette smoker 4   min discussion re active cigarette smoking in addition to office E&M  Ask about tobacco use:   ongoing Advise quitting   I took an extended  opportunity with this patient to outline the consequences of continued cigarette use  in airway disorders based on all the data we have from the multiple national lung health studies (perfomed over decades at millions of dollars in cost)  indicating that smoking cessation, not choice of inhalers or pulmonary physicians, is the most important aspect of her  care and she would likely see rapid improvement  in active symptoms once stops completely (x for anxiety which will need to be separately addressed by PCP.   Assess willingness:  Not committed at this point Assist in quit attempt:  Per PCP when ready Arrange follow up:   Follow up per Primary Care planned          Each maintenance medication was reviewed in detail including emphasizing most importantly the difference between maintenance and prns and under what circumstances the prns are to be triggered using an action plan format where appropriate.  Total time for H and P, chart review, counseling, reviewing hfa/ neb device(s) and generating customized AVS unique to this office visit / same day charting = 40 min for multiple acute and chronic  refractory respiratory  symptoms of uncertain etiology              AVS  Patient Instructions  Until are better and stay better off prednisone  :  Use Nebulizer albuterol / budesonide   0.25 mg twice daily automatically   Breztri  should be continued  Take 2 puffs after your nebulizer and then another 2 puffs about 12 hours later also after your nebulizer   Prendisone  10 mg Take 4 for three days 3 for three days 2 for three days 1 for three days and stop   Augmentin  875 mg take one pill twice daily  X 10 days - take at breakfast and supper with large glass of water .  It would help reduce the usual side effects (diarrhea and yeast infections) if you ate cultured yogurt at lunch.   Please schedule a follow up office visit in 4 weeks, sooner if needed - bring resp medications   Ozell America, MD 09/02/2024

## 2024-09-03 ENCOUNTER — Other Ambulatory Visit: Payer: Self-pay | Admitting: Internal Medicine

## 2024-09-03 DIAGNOSIS — F411 Generalized anxiety disorder: Secondary | ICD-10-CM

## 2024-09-03 MED ORDER — HYDROXYZINE PAMOATE 25 MG PO CAPS: 25.0000 mg | ORAL_CAPSULE | Freq: Two times a day (BID) | ORAL | 0 refills | Status: DC | PRN

## 2024-09-03 NOTE — Telephone Encounter (Signed)
Pt seen in clinic 11/19

## 2024-09-04 ENCOUNTER — Other Ambulatory Visit: Payer: Self-pay

## 2024-09-04 ENCOUNTER — Telehealth: Payer: Self-pay

## 2024-09-04 DIAGNOSIS — J449 Chronic obstructive pulmonary disease, unspecified: Secondary | ICD-10-CM

## 2024-09-04 MED ORDER — BUDESONIDE 0.25 MG/2ML IN SUSP: RESPIRATORY_TRACT | 12 refills | Status: AC

## 2024-09-04 MED ORDER — BUDESONIDE 0.25 MG/2ML IN SUSP: RESPIRATORY_TRACT | 12 refills | Status: DC

## 2024-09-04 NOTE — Telephone Encounter (Signed)
 Copied from CRM [id number]. Topic: Clinical - Prescription Issue >> Sep 03, 2024  9:28 AM Russell PARAS wrote: Reason for CRM:   Pt is contacting clinic regarding her prescription for budesonide  (PULMICORT ) 0.25 MG/2ML nebulizer solution. [facility] contacted her reporting they need more clarification on the directions for the medication and had sent a request to office on 11/19.   Pt requested required information be sent to [facility] so that she can receive solution  CB#  [phone]  See phone note

## 2024-09-04 NOTE — Telephone Encounter (Signed)
 Pt called back b/c she said she needs this medication.  Advised pt it was sent in, pharmacy confirmed at 9:30 am.  Please, any issues to call back. Nothing further needed at this time.

## 2024-09-04 NOTE — Telephone Encounter (Signed)
 Copied from CRM [id number]. Topic: Clinical - Prescription Issue >> Sep 03, 2024 12:03 PM Devaughn RAMAN wrote: Reason for CRM: pt is calling to check on her budesonide  (PULMICORT ) 0.25 MG/2ML nebulizer solution. Patient stated [facility] contacted her reporting they need more clarification and she has been waiting since yesterday, advised pt this would be taken care of, pt stated she needs her medication as she has not been able to start it. >> Sep 04, 2024  8:10 AM Corean SAUNDERS wrote: Patient has been calling for 3 days as her pharmacy needs more information on the medication listed above. Please call pharmacy and advise as patient is frustrated.  >> Sep 03, 2024  3:00 PM Joesph PARAS wrote: Patient calling in once again to request an update on this. Patient states pharmacy needs clarification on the dosage and amount instructions.   Resent rx for budesonide  in hopes that will be acceptable for pharmacy and insurance

## 2024-09-11 ENCOUNTER — Other Ambulatory Visit: Payer: Self-pay | Admitting: Internal Medicine

## 2024-09-11 DIAGNOSIS — F411 Generalized anxiety disorder: Secondary | ICD-10-CM

## 2024-09-15 ENCOUNTER — Encounter

## 2024-09-17 ENCOUNTER — Ambulatory Visit: Admitting: Gastroenterology

## 2024-09-17 ENCOUNTER — Encounter: Payer: Self-pay | Admitting: Gastroenterology

## 2024-09-17 VITALS — BP 105/66 | HR 96 | Temp 97.6°F | Ht 61.0 in | Wt 164.4 lb

## 2024-09-17 DIAGNOSIS — R140 Abdominal distension (gaseous): Secondary | ICD-10-CM | POA: Diagnosis not present

## 2024-09-17 DIAGNOSIS — K5900 Constipation, unspecified: Secondary | ICD-10-CM

## 2024-09-17 DIAGNOSIS — K219 Gastro-esophageal reflux disease without esophagitis: Secondary | ICD-10-CM

## 2024-09-17 DIAGNOSIS — K5909 Other constipation: Secondary | ICD-10-CM | POA: Diagnosis not present

## 2024-09-17 DIAGNOSIS — Z791 Long term (current) use of non-steroidal anti-inflammatories (NSAID): Secondary | ICD-10-CM

## 2024-09-17 MED ORDER — DEXLANSOPRAZOLE 60 MG PO CPDR: 60.0000 mg | DELAYED_RELEASE_CAPSULE | Freq: Every day | ORAL | 3 refills | Status: DC

## 2024-09-17 NOTE — Patient Instructions (Addendum)
 I am sending in Dexilant for you.  You will take 60 mg once daily.  I want you to try this for several days without taking your famotidine  at night but if you are noticing morning nighttime symptoms then you may resume the famotidine  and increase it to 40 mg at night before bed.  If for any reason Dexilant is not covered by insurance or too expensive then we will switch to Nexium which you will take 40 mg twice daily just as you did with the omeprazole .  Follow a GERD diet:  Avoid fried, fatty, greasy, spicy, citrus foods. Avoid/limit caffeine and carbonated beverages. Avoid chocolate. Try eating 4-6 small meals a day rather than 3 large meals. Do not eat within 3 hours of laying down. Prop head of bed up on wood or bricks to create a 6 inch incline.  To help with bowel movements I recommend you start a fiber supplement.  I prefer Metamucil which is psyllium and you can do this in capsule form but given your prior concerned about an allergic reaction, if you are concerned about trialing Metamucil then I would recommend a fiber supplement like Benefiber to help with bowel regularity as I suspect some of the irregularity and not emptying all the way is contributing to some of your bloating as well.  If you continue to have issues with the bloating at follow-up we can consider something called a Trio smart breath test will reassess for something called SIBO and IMO which means you are producing extra gas within your small bowel.  Over the counter options for bloating: FD Guard  IB Guard  Follow-up in 2 months, sooner if needed.  It was a pleasure to see you today. I want to create trusting relationships with patients. If you receive a survey regarding your visit,  I greatly appreciate you taking time to fill this out on paper or through your MyChart. I value your feedback.  Charmaine Melia, MSN, FNP-BC, AGACNP-BC [facility]

## 2024-09-17 NOTE — Progress Notes (Signed)
 GI Office Note    Referring Provider: Tobie Suzzane POUR, MD Primary Care Physician:  Tobie Suzzane POUR, MD Primary Gastroenterologist: Carlin POUR. Cindie, DO  Date:  09/17/2024  ID:  Jillian [name], DOB June 03, 1966, MRN [id number]   Chief Complaint   Chief Complaint  Patient presents with   Follow-up    Follow up. Still having issues with trapped gas, acid reflux and indigestion.     History of Present Illness  Jillian [name] is a 58 y.o. female with a history of concern of chronic mesenteric ischemia, COPD, CKD, anxiety, arthritis, diabetes, GERD, peptic ulcer disease presenting today with complaint of trapped gas and reflux.  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  SMA and IMA patent.  Started following with vascular surgery.  EGD 08/12/2023: - Significant gastritis-superficial ulcerations - 8 mm cratered gastric ulcer - Duodenal stricture dilated with endoscope - Biopsies negative for H. pylori  Last visit completed virtually 10/30/2023 and feeling much better overall, abdominal pain improved as well as bloating.  Continue to have occasional breakthrough reflux symptoms primarily at nighttime.  Decrease amount that she eats at night which seem to help.  Avoiding certain foods.  At that time she was taking omeprazole  twice daily as well as Carafate  as needed.  She was taking 1 Goody powder every few days for diffuse bodyaches and fibromyalgia..  Today:  Discussed the use of AI scribe software for clinical note transcription with the patient, who gave verbal consent to proceed.  She experiences persistent bloating and gas despite being on omeprazole  and famotidine . Bloating occurs particularly at night and is accompanied by multiple burps described as 'a big whoosh  of air' when she gets up to go to the bathroom. Although endoscopy helped with pain, she still feels bloated and hears her stomach 'talking' to her.  She takes omeprazole  and famotidine , with famotidine  taken at suppertime. She experiences acid reflux primarily at night, which sometimes wakes her up with a choking sensation. She has tried to manage this by watching her diet and avoiding late meals. Her reflux symptoms have improved but are not completely resolved.  She reports issues with bowel movements, describing them as not fully satisfying and feeling like 'it's trapped.' She goes to the bathroom daily but feels she does not empty completely. She has tried suppositories for gas relief but has not used other bowel movement aids. She has increased her intake of chicken and broccoli, which she feels helps her stomach feel better the next day.  She has COPD, which limits her physical activity, contributing to her sedentary lifestyle. She uses a peddler for exercise and drinks Pepsi as water  makes her nauseated. She is concerned about her fluid intake and its impact on her bowel regularity.  She has a history of bladder issues, having undergone bladder surgery three times for incontinence. She wears diapers due to difficulty holding urine and reports not emptying her bladder completely.      Wt Readings from Last 6 Encounters:  09/17/24 164 lb 6.4 oz (74.6 kg)  09/02/24 163 lb (73.9 kg)  08/25/24 166 lb 6.4 oz (75.5 kg)  05/26/24 164 lb (74.4 kg)  05/20/24 164 lb 1.3 oz (74.4 kg)  05/01/24 170 lb (77.1 kg)    Body mass index is  31.06 kg/m.   Current Outpatient Medications  Medication Sig Dispense Refill   albuterol  (PROVENTIL ) (2.5 MG/3ML) 0.083% nebulizer solution Take 3 mLs (2.5 mg total) by nebulization every 6 (six) hours as needed for wheezing or shortness of breath. 360 mL 3   albuterol  (VENTOLIN  HFA) 108 (90 Base) MCG/ACT inhaler INHALE 2 PUFFS INTO THE LUNGS EVERY 4 HOURS AS NEEDED  FOR WHEEZING OR SHORTNESS OF BREATH. 54 each 11   Aspirin-Acetaminophen -Caffeine (GOODY HEADACHE PO) Take 1 tablet by mouth as needed.     budesonide  (PULMICORT ) 0.25 MG/2ML nebulizer solution Take 2 ml along with 3 ml albuterol  by nebulization every 6 (six) hours as needed 360 mL 12   budesonide -glycopyrrolate-formoterol (BREZTRI  AEROSPHERE) 160-9-4.8 MCG/ACT AERO inhaler Take 2 puffs first thing in am and then another 2 puffs about 12 hours later. 10.7 g 11   DULoxetine  (CYMBALTA ) 60 MG capsule TAKE 1 CAPSULE BY MOUTH 2 TIMES DAILY. 180 capsule 1   famotidine  (PEPCID ) 20 MG tablet Take 1 tablet (20 mg total) by mouth daily. 90 tablet 3   Magnesium  200 MG TABS Take 1 tablet (200 mg total) by mouth daily at 12 noon. 90 tablet 3   metFORMIN  (GLUCOPHAGE ) 500 MG tablet TAKE 1 TABLET BY MOUTH 2 TIMES DAILY WITH A MEAL. 180 tablet 1   olmesartan  (BENICAR ) 20 MG tablet TAKE 1 TABLET BY MOUTH EVERY DAY 90 tablet 1   omeprazole  (PRILOSEC) 40 MG capsule TAKE 1 CAPSULE (40 MG TOTAL) BY MOUTH 2 (TWO) TIMES DAILY BEFORE A MEAL. 180 capsule 0   rosuvastatin  (CRESTOR ) 20 MG tablet TAKE 1 TABLET BY MOUTH EVERY DAY 90 tablet 1   traZODone  (DESYREL ) 50 MG tablet Take 1.5 tablets (75 mg total) by mouth at bedtime as needed for sleep. 135 tablet 0   vitamin B-12 (CYANOCOBALAMIN) 1000 MCG tablet Take 1,000 mcg by mouth daily.     amoxicillin -clavulanate (AUGMENTIN ) 875-125 MG tablet Take 1 tablet by mouth 2 (two) times daily. (Patient not taking: Reported on 09/17/2024) 20 tablet 0   fluticasone  (FLONASE ) 50 MCG/ACT nasal spray Place 1 spray into both nostrils daily. 16 g 2   hydrOXYzine  (VISTARIL ) 25 MG capsule TAKE 1 CAPSULE (25 MG TOTAL) BY MOUTH EVERY 12 (TWELVE) HOURS AS NEEDED FOR ANXIETY. (Patient not taking: Reported on 09/17/2024) 180 capsule 1   predniSONE  (DELTASONE ) 10 MG tablet Take 4 for three days 3 for three days 2 for three days 1 for three days and stop (Patient not taking: Reported on 09/17/2024) 30  tablet 0   No current facility-administered medications for this visit.    Past Medical History:  Diagnosis Date   Allergy 2000,   Bananas, Docecycline   Anxiety    Arthritis    Asthma    Chronic kidney disease    COPD (chronic obstructive pulmonary disease) (HCC)    Depression    Diabetes mellitus without complication (HCC)    GERD (gastroesophageal reflux disease)    High blood pressure    History of bladder problems    Migraines    Sleep apnea    Ulcer     Past Surgical History:  Procedure Laterality Date   ABDOMINAL HYSTERECTOMY  1991   APPENDECTOMY     BALLOON DILATION N/A 01/13/2024   Procedure: BALLOON DILATION;  Surgeon: Cindie Carlin POUR, DO;  [facility];  Service: Endoscopy;  Laterality: N/A;   BIOPSY  08/12/2023   Procedure: BIOPSY;  Surgeon: Cindie Carlin POUR, DO;  [facility];  Service: Endoscopy;;   BLADDER SURGERY     x3   COLON SURGERY  1982   COLONOSCOPY  06/2018   Dr. Saintclair: Normal terminal ileum with normal biopsies.  Evidence of prior end to side ileocolonic anastomosis in the cecum.  This was patent and was characterized by healthy-appearing mucosa.  Multiple small and large mouth diverticula noted in the sigmoid colon and descending colon.  Random colon biopsies were benign.  Recommended colonoscopy in 10 years.   ESOPHAGOGASTRODUODENOSCOPY (EGD) WITH PROPOFOL  N/A 08/12/2023   Procedure: ESOPHAGOGASTRODUODENOSCOPY (EGD) WITH PROPOFOL ;  Surgeon: Cindie Carlin POUR, DO;  [facility];  Service: Endoscopy;  Laterality: N/A;  245pm, asa 3   ESOPHAGOGASTRODUODENOSCOPY (EGD) WITH PROPOFOL  N/A 01/13/2024   Procedure: ESOPHAGOGASTRODUODENOSCOPY (EGD) WITH PROPOFOL ;  Surgeon: Cindie Carlin POUR, DO;  [facility];  Service: Endoscopy;  Laterality: N/A;  1015am, asa 3   ileum resection  1982   LAPAROSCOPIC CHOLECYSTECTOMY     PARTIAL HYSTERECTOMY      Family History  Problem Relation Age of Onset   Diabetes  Mother    COPD Mother    Heart attack Father    Diabetes Sister    ADD / ADHD Son    Alcohol abuse Son    Colon cancer Neg Hx    Celiac disease Neg Hx    Inflammatory bowel disease Neg Hx     Allergies as of 09/17/2024 - Review Complete 09/17/2024  Allergen Reaction Noted   Doxycycline Photosensitivity 08/23/2016   Banana Hives 07/10/2019    Social History   Socioeconomic History   Marital status: Divorced    Spouse name: Not on file   Number of children: 1   Years of education: Not on file   Highest education level: Some college, no degree  Occupational History   Occupation: Comptroller: [employer]    Comment: currently on short term disability  Tobacco Use   Smoking status: Every Day    Current packs/day: 1.50    Average packs/day: 1.5 packs/day for 43.0 years (64.5 ttl pk-yrs)    Types: Cigarettes   Smokeless tobacco: Never   Tobacco comments:    2 ppd   Vaping Use   Vaping status: Never Used  Substance and Sexual Activity   Alcohol use: Yes    Comment: hardly ever   Drug use: No   Sexual activity: Not on file  Other Topics Concern   Not on file  Social History Narrative   Not on file   Social Drivers of Health   Financial Resource Strain: High Risk (08/12/2024)   Overall Financial Resource Strain (CARDIA)    Difficulty of Paying Living Expenses: Hard  Food Insecurity: Food Insecurity Present (08/13/2024)   Hunger Vital Sign    Worried About Running Out of Food in the Last Year: Sometimes true    Ran Out of Food in the Last Year: Sometimes true  Transportation Needs: Unmet Transportation Needs (08/12/2024)   PRAPARE - Administrator, Civil Service (Medical): Yes    Lack of Transportation (Non-Medical): No  Physical Activity: Inactive (07/28/2024)   Exercise Vital Sign    Days of Exercise per Week: 0 days    Minutes of Exercise per Session: Not on file  Stress: No Stress Concern Present (08/12/2024)   [employer]  of Occupational Health - Occupational Stress Questionnaire    Feeling of Stress: Only a little  Recent Concern: Stress -  Stress Concern Present (07/28/2024)   [employer] of Occupational Health - Occupational Stress Questionnaire    Feeling of Stress: Very much  Social Connections: Socially Isolated (07/28/2024)   Social Connection and Isolation Panel    Frequency of Communication with Friends and Family: More than three times a week    Frequency of Social Gatherings with Friends and Family: Three times a week    Attends Religious Services: Never    Active Member of Clubs or Organizations: No    Attends Engineer, Structural: Not on file    Marital Status: Divorced    Review of Systems   Gen: Denies fever, chills, anorexia. Denies fatigue, weakness, weight loss.  CV: Denies chest pain, palpitations, syncope, peripheral edema, and claudication. Resp: Denies dyspnea at rest, cough, wheezing, coughing up blood, and pleurisy. GI: See HPI Psych: Denies depression, anxiety, memory loss, confusion. No homicidal or suicidal ideation.  Heme: Denies bruising, bleeding, and enlarged lymph nodes.  Physical Exam   BP 105/66 (BP Location: Right Arm, Patient Position: Sitting, Cuff Size: Large)   Pulse 96   Temp 97.6 F (36.4 C) (Temporal)   Ht 5' 1 (1.549 m)   Wt 164 lb 6.4 oz (74.6 kg)   BMI 31.06 kg/m   General:   Alert and oriented. No distress noted. Pleasant and cooperative.  Head:  Normocephalic and atraumatic. Eyes:  Conjuctiva clear without scleral icterus. Mouth:  Oral mucosa pink and moist. Good dentition. No lesions. Abdomen:  +BS, soft, non-distended. Ttp to epigastrium and RUQ. No rebound or guarding. No HSM or masses noted. Rectal: deferred Msk:  Symmetrical without gross deformities. Normal posture. Extremities:  Without edema. Neurologic:  Alert and  oriented x4 Psych:  Alert and cooperative. Normal mood and affect.  Assessment & Plan  Jillian [name] is  a 58 y.o. female presenting today with bloating and GERD issues.      Gastroesophageal reflux disease with upper abdominal bloating Chronic gastroesophageal reflux disease with persistent upper abdominal bloating. Symptoms include nocturnal acid reflux, choking sensation, and trapped gas. Current treatment with omeprazole  and famotidine  provides partial relief. Consideration of stronger medication due to persistent symptoms. Discussed potential benefits of switching to Dexilant or Nexium, with insurance considerations affecting choice. Emphasized dietary modifications and physical activity to manage symptoms. - Prescribed Dexilant 60 mg daily, if not covered, will prescribe Nexium. - Instructed to discontinue omeprazole  upon starting new medication. - Advised to increase famotidine  to 40 mg at bedtime if continuing. - Recommended dietary modifications to avoid late meals and high-fat foods. - Suggested over-the-counter natural remedies such as FD Guard, and/or reflux gourmet for symptom relief.  Chronic constipation with abdominal bloating Chronic constipation with associated abdominal bloating. Symptoms include incomplete bowel movements and trapped gas. Current management includes dietary modifications with increased fiber intake. Discussed potential benefits of fiber supplements and physical activity to improve bowel regularity. Consideration of alternative fiber supplements due to previous adverse reaction to Metamucil powder. Has concerns about prior possible reaction to Metamucil in the past.  - Recommended increasing dietary fiber intake with foods like broccoli. - Suggested trying Metamucil capsules or Benefiber as fiber supplements. - Could consider IB Guard for relief of bloating related to constipation  - Encouraged regular physical activity to aid bowel movements. - Advised on adequate fluid intake, considering alternatives to water  if tolerated.     Follow up   Follow up 2 months.     Jillian Melia, MSN, FNP-BC, AGACNP-BC [facility]

## 2024-09-20 ENCOUNTER — Encounter: Payer: Self-pay | Admitting: Internal Medicine

## 2024-09-21 ENCOUNTER — Telehealth: Admitting: Internal Medicine

## 2024-09-21 ENCOUNTER — Encounter: Payer: Self-pay | Admitting: Internal Medicine

## 2024-09-21 DIAGNOSIS — J0111 Acute recurrent frontal sinusitis: Secondary | ICD-10-CM

## 2024-09-21 DIAGNOSIS — F5101 Primary insomnia: Secondary | ICD-10-CM

## 2024-09-21 DIAGNOSIS — J449 Chronic obstructive pulmonary disease, unspecified: Secondary | ICD-10-CM

## 2024-09-21 MED ORDER — AZITHROMYCIN 250 MG PO TABS: ORAL_TABLET | ORAL | 0 refills | Status: AC

## 2024-09-21 MED ORDER — TRAZODONE HCL 100 MG PO TABS: 100.0000 mg | ORAL_TABLET | Freq: Every evening | ORAL | 0 refills | Status: AC | PRN

## 2024-09-21 MED ORDER — AMOXICILLIN-POT CLAVULANATE 875-125 MG PO TABS: 1.0000 | ORAL_TABLET | Freq: Two times a day (BID) | ORAL | 0 refills | Status: DC

## 2024-09-21 NOTE — Patient Instructions (Addendum)
 Please start taking Augmentin  and azithromycin  as prescribed.  Please use Flonase  for nasal congestion/allergies.  Please start taking trazodone  100 mg at bedtime for insomnia.  Please take hydroxyzine  as needed for anxiety.

## 2024-09-21 NOTE — Assessment & Plan Note (Addendum)
 Started empiric Augmentin  considering persistent symptoms despite symptomatic treatment Added azithromycin  as she has history of COPD as well Continue Flonase  for nasal congestion Tessalon  as needed for cough

## 2024-09-21 NOTE — Progress Notes (Signed)
 Virtual Visit via Video Note   Because of Jillian [name]'s co-morbid illnesses, she is at least at moderate risk for complications without adequate follow up.  This format is felt to be most appropriate for this patient at this time.  All issues noted in this document were discussed and addressed.  A limited physical exam was performed with this format.      Evaluation Performed:  Follow-up visit  Date:  09/21/2024   ID:  Jillian [name], DOB Feb 14, 1966, MRN [id number]  Patient Location: Home Provider Location: Office/Clinic  Participants: Patient Location of Patient: Home Location of Provider: Telehealth Consent was obtain for visit to be over via telehealth. I verified that I am speaking with the correct person using two identifiers.  PCP:  Tobie Suzzane POUR, MD   Chief Complaint: Nasal congestion and cough  History of Present Illness:    Jillian [name] is a 58 y.o. female who has a video visit for complaint of nasal congestion, sinus pressure related headache and cough with greenish-yellow expectoration for the last 10 days.  Her headache is worse since yesterday.  Denies any recent fever or chills.  She was given Augmentin  and prednisone  on 09/02/24 by Pulmonology for COPD exacerbation.  She had felt better initially, has started feeling worse since completing the antibiotic.  Insomnia: She also reports difficulty initiating sleep.  She is currently taking trazodone  75 mg at bedtime.  She also has worsening of anxiety recently.  She was given hydroxyzine , but did not see any improvement with it.  She also takes Cymbalta  60 mg QD currently for fibromyalgia.  The patient does not have symptoms concerning for COVID-19 infection (fever, chills, cough, or new shortness of breath).   Past Medical, Surgical, Social History, Allergies, and Medications have been Reviewed.  Past Medical History:  Diagnosis Date   Allergy 2000,   Bananas, Docecycline   Anxiety    Arthritis    Asthma     Chronic kidney disease    COPD (chronic obstructive pulmonary disease) (HCC)    Depression    Diabetes mellitus without complication (HCC)    GERD (gastroesophageal reflux disease)    High blood pressure    History of bladder problems    Migraines    Sleep apnea    Ulcer    Past Surgical History:  Procedure Laterality Date   ABDOMINAL HYSTERECTOMY  1991   APPENDECTOMY     BALLOON DILATION N/A 01/13/2024   Procedure: BALLOON DILATION;  Surgeon: Cindie Carlin POUR, DO;  [facility];  Service: Endoscopy;  Laterality: N/A;   BIOPSY  08/12/2023   Procedure: BIOPSY;  Surgeon: Cindie Carlin POUR, DO;  [facility];  Service: Endoscopy;;   BLADDER SURGERY     x3   COLON SURGERY  1982   COLONOSCOPY  06/2018   Dr. Saintclair: Normal terminal ileum with normal biopsies.  Evidence of prior end to side ileocolonic anastomosis in the cecum.  This was patent and was characterized by healthy-appearing mucosa.  Multiple small and large mouth diverticula noted in the sigmoid colon and descending colon.  Random colon biopsies were benign.  Recommended colonoscopy in 10 years.   ESOPHAGOGASTRODUODENOSCOPY (EGD) WITH PROPOFOL  N/A 08/12/2023   Procedure: ESOPHAGOGASTRODUODENOSCOPY (EGD) WITH PROPOFOL ;  Surgeon: Cindie Carlin POUR, DO;  [facility];  Service: Endoscopy;  Laterality: N/A;  245pm, asa 3   ESOPHAGOGASTRODUODENOSCOPY (EGD) WITH PROPOFOL  N/A 01/13/2024   Procedure: ESOPHAGOGASTRODUODENOSCOPY (EGD) WITH PROPOFOL ;  Surgeon: Cindie Carlin POUR, DO;  [facility];  Service: Endoscopy;  Laterality: N/A;  1015am, asa 3   ileum resection  1982   LAPAROSCOPIC CHOLECYSTECTOMY     PARTIAL HYSTERECTOMY       Current Meds  Medication Sig   albuterol  (PROVENTIL ) (2.5 MG/3ML) 0.083% nebulizer solution Take 3 mLs (2.5 mg total) by nebulization every 6 (six) hours as needed for wheezing or shortness of breath.   albuterol  (VENTOLIN  HFA) 108 (90 Base) MCG/ACT inhaler  INHALE 2 PUFFS INTO THE LUNGS EVERY 4 HOURS AS NEEDED FOR WHEEZING OR SHORTNESS OF BREATH.   amoxicillin -clavulanate (AUGMENTIN ) 875-125 MG tablet Take 1 tablet by mouth 2 (two) times daily.   Aspirin-Acetaminophen -Caffeine (GOODY HEADACHE PO) Take 1 tablet by mouth as needed.   budesonide  (PULMICORT ) 0.25 MG/2ML nebulizer solution Take 2 ml along with 3 ml albuterol  by nebulization every 6 (six) hours as needed   budesonide -glycopyrrolate-formoterol (BREZTRI  AEROSPHERE) 160-9-4.8 MCG/ACT AERO inhaler Take 2 puffs first thing in am and then another 2 puffs about 12 hours later.   dexlansoprazole  (DEXILANT ) 60 MG capsule Take 1 capsule (60 mg total) by mouth daily.   DULoxetine  (CYMBALTA ) 60 MG capsule TAKE 1 CAPSULE BY MOUTH 2 TIMES DAILY.   famotidine  (PEPCID ) 20 MG tablet Take 1 tablet (20 mg total) by mouth daily.   fluticasone  (FLONASE ) 50 MCG/ACT nasal spray Place 1 spray into both nostrils daily.   hydrOXYzine  (VISTARIL ) 25 MG capsule TAKE 1 CAPSULE (25 MG TOTAL) BY MOUTH EVERY 12 (TWELVE) HOURS AS NEEDED FOR ANXIETY.   Magnesium  200 MG TABS Take 1 tablet (200 mg total) by mouth daily at 12 noon.   metFORMIN  (GLUCOPHAGE ) 500 MG tablet TAKE 1 TABLET BY MOUTH 2 TIMES DAILY WITH A MEAL.   olmesartan  (BENICAR ) 20 MG tablet TAKE 1 TABLET BY MOUTH EVERY DAY   rosuvastatin  (CRESTOR ) 20 MG tablet TAKE 1 TABLET BY MOUTH EVERY DAY   traZODone  (DESYREL ) 50 MG tablet Take 1.5 tablets (75 mg total) by mouth at bedtime as needed for sleep.   vitamin B-12 (CYANOCOBALAMIN) 1000 MCG tablet Take 1,000 mcg by mouth daily.     Allergies:   Doxycycline and Banana   ROS:   Please see the history of present illness. All other systems reviewed and are negative.   Labs/Other Tests and Data Reviewed:    Recent Labs: 08/25/2024: ALT 15; BUN 22; Creatinine, Ser 1.10; Hemoglobin 12.3; Platelets 250; Potassium 4.7; Sodium 143; TSH 1.840   Recent Lipid Panel Lab Results  Component Value Date/Time   CHOL 92  (L) 01/03/2024 09:23 AM   TRIG 204 (H) 01/03/2024 09:23 AM   HDL 39 (L) 01/03/2024 09:23 AM   CHOLHDL 2.4 01/03/2024 09:23 AM   LDLCALC 21 01/03/2024 09:23 AM    Wt Readings from Last 3 Encounters:  09/17/24 164 lb 6.4 oz (74.6 kg)  09/02/24 163 lb (73.9 kg)  08/25/24 166 lb 6.4 oz (75.5 kg)     Objective:    Vital Signs:  There were no vitals taken for this visit.   VITAL SIGNS:  reviewed GEN:  no acute distress EYES:  sclerae anicteric, EOMI - Extraocular Movements Intact RESPIRATORY:  normal respiratory effort, symmetric expansion NEURO:  alert and oriented x 3, no obvious focal deficit PSYCH:  normal affect  ASSESSMENT & PLAN:    Assessment & Plan Acute recurrent frontal sinusitis Started empiric Augmentin  considering persistent symptoms despite symptomatic treatment Added azithromycin  as she has history of COPD as well  Continue Flonase  for nasal congestion Tessalon  as needed for cough Primary insomnia Takes trazodone  75 mg nightly Still has difficulty initiating and maintaining sleep - increased dose of trazodone  to 100 mg nightly Tried amitriptyline  without any relief She has Flexeril  now for fibromyalgia, can also help with sleep Chronic obstructive pulmonary disease, unspecified COPD type (HCC) Has recent worsening of cough and dyspnea, likely due to COPD exacerbation Prednisone  taper recently completed, would avoid further oral steroids Started oral Augmentin  and azithromycin  Has Breztri  and as needed albuterol  Recently started Pulmicort  neb as well Needs to cut down -> quit smoking Followed by pulmonology    I discussed the assessment and treatment plan with the patient. The patient was provided an opportunity to ask questions, and all were answered. The patient agreed with the plan and demonstrated an understanding of the instructions.   The patient was advised to call back or seek an in-person evaluation if the symptoms worsen or if the condition fails to  improve as anticipated.  The above assessment and management plan was discussed with the patient. The patient verbalized understanding of and has agreed to the management plan.   Medication Adjustments/Labs and Tests Ordered: Current medicines are reviewed at length with the patient today.  Concerns regarding medicines are outlined above.   Tests Ordered: No orders of the defined types were placed in this encounter.   Medication Changes: No orders of the defined types were placed in this encounter.    Note: This dictation was prepared with Dragon dictation along with smaller phrase technology. Similar sounding words can be transcribed inadequately or may not be corrected upon review. Any transcriptional errors that result from this process are unintentional.      Disposition:  Follow up  Signed, Suzzane MARLA Blanch, MD  09/21/2024 11:43 AM     [facility]

## 2024-09-21 NOTE — Assessment & Plan Note (Addendum)
 Takes trazodone  75 mg nightly Still has difficulty initiating and maintaining sleep - increased dose of trazodone  to 100 mg nightly Tried amitriptyline  without any relief She has Flexeril  now for fibromyalgia, can also help with sleep

## 2024-09-21 NOTE — Assessment & Plan Note (Addendum)
 Has recent worsening of cough and dyspnea, likely due to COPD exacerbation Prednisone  taper recently completed, would avoid further oral steroids Started oral Augmentin  and azithromycin  Has Breztri  and as needed albuterol  Recently started Pulmicort  neb as well Needs to cut down -> quit smoking Followed by pulmonology

## 2024-09-23 ENCOUNTER — Ambulatory Visit: Admitting: Internal Medicine

## 2024-09-30 ENCOUNTER — Ambulatory Visit: Admitting: Internal Medicine

## 2024-09-30 ENCOUNTER — Encounter: Payer: Self-pay | Admitting: Internal Medicine

## 2024-09-30 VITALS — BP 136/57 | HR 86 | Ht 61.0 in | Wt 163.0 lb

## 2024-09-30 DIAGNOSIS — J329 Chronic sinusitis, unspecified: Secondary | ICD-10-CM | POA: Insufficient documentation

## 2024-09-30 DIAGNOSIS — J40 Bronchitis, not specified as acute or chronic: Secondary | ICD-10-CM | POA: Diagnosis not present

## 2024-09-30 DIAGNOSIS — J449 Chronic obstructive pulmonary disease, unspecified: Secondary | ICD-10-CM

## 2024-09-30 DIAGNOSIS — F17210 Nicotine dependence, cigarettes, uncomplicated: Secondary | ICD-10-CM

## 2024-09-30 MED ORDER — PREDNISONE 10 MG PO TABS: ORAL_TABLET | ORAL | 0 refills | Status: AC

## 2024-09-30 MED ORDER — ALBUTEROL SULFATE (2.5 MG/3ML) 0.083% IN NEBU: 2.5000 mg | INHALATION_SOLUTION | Freq: Four times a day (QID) | RESPIRATORY_TRACT | 3 refills | Status: AC | PRN

## 2024-09-30 NOTE — Progress Notes (Unsigned)
 Jillian [name], female    DOB: [birth year]    MRN: [id number]   Brief patient profile:  58  yowf  active smoker/MM    former [facility] Patient with AB pt self referred to pulmonary clinic in [place]  05/26/2024  for AB    A1AT 12/09/19 >> 184, MM PFT 01/18/20 >> FEV1 1.43 (56%), Ratio  0.70, TLC 4.34 (89%), DLCO 69%, +BD      History of Present Illness  05/26/2024  Pulmonary/ 1st office eval/ Xayvier Vallez / [place] [facility] maint Trelegy / still smoking  Chief Complaint  Patient presents with   COPD    ACUTE: Coughing, tight chest, shob  Dyspnea:  onset Jan 2021 and better on Trelegy  Cough: thick yellow worse in am x several hours to clear  Sleep: pillows under mattress and big one under head and some  cough at hs but then resolved until recurring each am  SABA use: twice daily  02: none  Patient Instructions  Plan A = Automatic = Always=    Trelegy 100 one click each am  Plan B = Backup (to supplement plan A, not to replace it) Use your albuterol  inhaler as a rescue medication Plan C = Crisis (instead of Plan B but only if Plan B stops working) - only use your albuterol  nebulizer if you first try Plan B The key is to stop smoking completely before smoking completely stops you!  Please schedule a follow up visit in 3 months but call sooner if needed  with all medications /inhalers/ solutions in hand    08/25/2024  f/u ov/[facility] [facility]/Lashika Erker re: AB  maint on trelegy   still  smoker  did not  bring meds  Chief Complaint  Patient presents with   COPD    Pft 11/11 Non productive cough / shob urinate on herself bc she so tense when she scared she can't catch her breath   Dyspnea:  walking across the room x maybe a year  Cough: rattling cough / tickle  Sleeping: bed is flat with pillows under mattress plus one head pillow  and fades out over several hours and on awakening  SABA use: uses ventolin  and then 2-3 hours p 02: none  Lung cancer screening: due in January  Patient  Instructions  For cough/ congestion > mucinex or mucinex dm  up to maximum of  1200 mg every 12 hours and use the flutter valve as much as you can   Plan A = Automatic = Always=    Breztri  Take 2 puffs first thing in am and then another 2 puffs about 12 hours later.  Work on inhaler technique: Prednisone  10 mg take  4 each am x 2 days,   2 each am x 2 days,  1 each am x 2 days and stop   Plan B = Backup (to supplement plan A, not to replace it) Use your albuterol  inhaler as a rescue medication  Plan C = Crisis (instead of Plan B but only if Plan B stops working) - only use your albuterol  nebulizer if you first try Plan B Please schedule a follow up office visit in 4 weeks, call sooner if needed with all medications /inhalers/ solutions in hand     09/02/2024  ACUTE  ov/ office/Yudit Modesitt re: AB maint on breztri    active  smoker  Chief Complaint  Patient presents with   Acute Visit    Med management - states using inhalers wrong -  Coughing with  yellow-clear mucus - DOE - Wants to quit smoking   Dyspnea:  across the room  Cough: smoker's rattle mucus turning more yellow asssoc with more nasal congestoin and 'sinus HA   Sleeping: bed is flat/ lots of pillows with some noct cough as well as flares in am  SABA use: twice daily hfa/ nebulizer  02: none  lung cancer screening: q January  Patient Instructions  Until are better and stay better off prednisone  : Use Nebulizer albuterol / budesonide   0.25 mg twice daily automatically  Breztri  should be continued Take 2 puffs after your nebulizer and then another 2 puffs about 12 hours later also after your nebulizer  Predisone  10 mg Take 4 for three days 3 for three days 2 for three days 1 for three days and stop  Augmentin  875 mg take one pill twice daily  X 10 days - take at breakfast and supper with large glass of water .  It would help reduce the usual side effects (diarrhea and yeast infections) if you ate cultured yogurt at lunch.   Please schedule a follow up office visit in 4 weeks, sooner if needed - bring resp medications     09/30/2024  f/u ov/[place] [facility]/Johnthomas Lader re: GOLD 3/ AB maint on breztri  / neb alb/budesonide   and no prednisone   Chief Complaint  Patient presents with   COPD    Med check up Day time sleepiness* / shob - coughing   Dyspnea:  mostly house bound  Cough: smoker's rattle > mucoid  Sleeping: bed is flat / 3 pillows    resp cc  SABA use: twice daily  02: none  Lung cancer screening: due in jan 2019    No obvious day to day or daytime variability or assoc excess/ purulent sputum or mucus plugs or hemoptysis or cp or chest tightness, subjective wheeze or overt sinus or hb symptoms.    Also denies any obvious fluctuation of symptoms with weather or environmental changes or other aggravating or alleviating factors except as outlined above   No unusual exposure hx or h/o childhood pna/ asthma or knowledge of premature birth.  Current Allergies, Complete Past Medical History, Past Surgical History, Family History, and Social History were reviewed in [employer] record.  ROS  The following are not active complaints unless bolded Hoarseness, sore throat, dysphagia, dental problems, itching, sneezing,  nasal congestion or discharge of excess mucus or purulent secretions, ear ache,   fever, chills, sweats, unintended wt loss or wt gain, classically pleuritic or exertional cp,  orthopnea pnd or arm/hand swelling  or leg swelling, presyncope, palpitations, abdominal pain, anorexia, nausea, vomiting, diarrhea  or change in bowel habits or change in bladder habits, change in stools or change in urine, dysuria, hematuria,  rash, arthralgias, visual complaints, headache, numbness, weakness or ataxia or problems with walking or coordination,  change in mood or  memory.         Outpatient Medications Prior to Visit  Medication Sig Dispense Refill   albuterol  (VENTOLIN  HFA) 108 (90  Base) MCG/ACT inhaler INHALE 2 PUFFS INTO THE LUNGS EVERY 4 HOURS AS NEEDED FOR WHEEZING OR SHORTNESS OF BREATH. 54 each 11   Aspirin-Acetaminophen -Caffeine (GOODY HEADACHE PO) Take 1 tablet by mouth as needed.     budesonide  (PULMICORT ) 0.25 MG/2ML nebulizer solution Take 2 ml along with 3 ml albuterol  by nebulization every 6 (six) hours as needed 360 mL 12   budesonide -glycopyrrolate-formoterol (BREZTRI  AEROSPHERE) 160-9-4.8 MCG/ACT AERO inhaler Take 2 puffs first  thing in am and then another 2 puffs about 12 hours later. 10.7 g 11   dexlansoprazole  (DEXILANT ) 60 MG capsule Take 1 capsule (60 mg total) by mouth daily. 90 capsule 3   DULoxetine  (CYMBALTA ) 60 MG capsule TAKE 1 CAPSULE BY MOUTH 2 TIMES DAILY. 180 capsule 1   famotidine  (PEPCID ) 20 MG tablet Take 1 tablet (20 mg total) by mouth daily. 90 tablet 3   fluticasone  (FLONASE ) 50 MCG/ACT nasal spray Place 1 spray into both nostrils daily. 16 g 2   hydrOXYzine  (VISTARIL ) 25 MG capsule TAKE 1 CAPSULE (25 MG TOTAL) BY MOUTH EVERY 12 (TWELVE) HOURS AS NEEDED FOR ANXIETY. 180 capsule 1   Magnesium  200 MG TABS Take 1 tablet (200 mg total) by mouth daily at 12 noon. 90 tablet 3   metFORMIN  (GLUCOPHAGE ) 500 MG tablet TAKE 1 TABLET BY MOUTH 2 TIMES DAILY WITH A MEAL. 180 tablet 1   olmesartan  (BENICAR ) 20 MG tablet TAKE 1 TABLET BY MOUTH EVERY DAY 90 tablet 1   rosuvastatin  (CRESTOR ) 20 MG tablet TAKE 1 TABLET BY MOUTH EVERY DAY 90 tablet 1   traZODone  (DESYREL ) 100 MG tablet Take 1 tablet (100 mg total) by mouth at bedtime as needed for sleep. 90 tablet 0   vitamin B-12 (CYANOCOBALAMIN) 1000 MCG tablet Take 1,000 mcg by mouth daily.     albuterol  (PROVENTIL ) (2.5 MG/3ML) 0.083% nebulizer solution Take 3 mLs (2.5 mg total) by nebulization every 6 (six) hours as needed for wheezing or shortness of breath. 360 mL 3   amoxicillin -clavulanate (AUGMENTIN ) 875-125 MG tablet Take 1 tablet by mouth 2 (two) times daily. (Patient not taking: Reported on  09/30/2024) 14 tablet 0   No facility-administered medications prior to visit.       Past Medical History:  Diagnosis Date   Allergy 2000,   Bananas, Docecycline   Anxiety    Arthritis    Asthma    Chronic kidney disease    COPD (chronic obstructive pulmonary disease) (HCC)    Depression    Diabetes mellitus without complication (HCC)    GERD (gastroesophageal reflux disease)    High blood pressure    History of bladder problems    Migraines    Sleep apnea    Ulcer       Objective:    Wts  09/30/2024     163  09/02/2024      163    08/25/24 166 lb 6.4 oz (75.5 kg)  05/26/24 164 lb (74.4 kg)  05/20/24 164 lb 1.3 oz (74.4 kg)    Vital signs reviewed  09/30/2024  - Note at rest 02 sats  94% on RA   General appearance:    anxious chronically ill wf nasal tone to voice p zpak/ amox      HEENT : Oropharynx  clear/dentition poor.   Nasal turbinates severe Turbinate edema   NECK :  without  apparent JVD/ palpable Nodes/TM    LUNGS: no acc muscle use,  Min barrel  contour chest wall with bilateral  slightly decreased bs s audible wheeze and  without cough on insp or exp maneuvers and min  Hyperresonant  to  percussion bilaterally    CV:  RRR  no s3 or murmur or increase in P2, and no edema   ABD:  soft and nontender    MS:  Nl gait/ ext warm without deformities Or obvious joint restrictions  calf tenderness, cyanosis or clubbing     SKIN: warm and dry  without lesions    NEURO:  alert, approp, nl sensorium with  no motor or cerebellar deficits apparent.            Assessment   Assessment & Plan COPD mixed type (HCC) Asthmatic bronchitis / GOLD 0 COPD / active smoker/MM - A1AT 12/09/19 >> 184, MM PFT 01/18/20 >> FEV1 1.43 (56%), Ratio  0.70, TLC 4.34 (89%), DLCO 69%, +BD - 05/26/2024  After extensive coaching inhaler device,  effectiveness =  75% hfa/DPI  - 08/25/2024  After extensive coaching inhaler device,  effectiveness =    75% hfa > changed to breztri  /  flutter/ pred x 6 d only and f/u in 4 weeks  - PFT's  08/25/24  FEV1 0.84 (36 % ) ratio 0.57  p6 % improvement from saba p 0 prior to study with DLCO  9.5 (52%)   and FV curve classically concave and ERV 33 % at wt 164  08/25/24 - 09/02/2024  After extensive coaching inhaler device,  effectiveness =    50% hfa so add albuterol / bud 0.25 mg bid x one month and pred x 12 days plus augmentin  x 10 d for possible sinusits - 09/30/2024  After extensive coaching inhaler device,  effectiveness =    75% with hfa > continue bretri and alb/bud bid between doses of breztri  Chronic obstructive pulmonary disease, unspecified COPD type (HCC)  Chronic sinusitis with recurrent bronchitis Completed zpak/ augmentin  s benefi 09/30/2024 > rec Sinus CT next    Cigarette smoker Counseled re importance of smoking cessation but did not meet time criteria for separate billing      AVS  Patient Instructions  For cough/ congestion > mucinex D up to maximum of  1200 mg every 12 hours and use the flutter valve as much as you can    Prednisone  10 mg take  4 each am x 2 days,   2 each am x 2 days,  1 each am x 2 days and stop   My office will be contacting you by phone for referral to sinus CT   - if you don't hear back from my office within one week please call us  back or notify us  thru MyChart and we'll address it right away.   Follow up with with Teoh or let us  refer you   Plan A = Automatic = Always=    Breztri  Take 2 puffs first thing in am and then another 2 puffs about 12 hours later and albuterol  /budesonide   together twice daily  (can stagger these so you get something automatically 4 x daily   Plan B = Backup (to supplement plan A, not to replace it) Use your albuterol  inhaler as a rescue medication to be used if you can't catch your breath by resting or slowing your pace  or doing a relaxed purse lip breathing pattern.  - The less you use it, the better it will work when you need it. - Ok to use the  inhaler up to 2 puffs  every 4 hours if you must but call for appointment if use goes up over your usual need - Don't leave home without it !!  (think of it like the spare tire or starter fluid for your car)    Plan C = Crisis (instead of Plan B but only if Plan B stops working) - only use your albuterol  nebulizer if you first try Plan B and it fails to help > ok to use the nebulizer up to  every 4 hours but if start needing it regularly call for immediate appointment  Do not use ipatropium when on breztri      Please schedule a follow up office visit in 6 weeks, call sooner if needed               Ozell America, MD 09/30/2024         Ozell America, MD 09/02/2024

## 2024-09-30 NOTE — Patient Instructions (Addendum)
 For cough/ congestion > mucinex D up to maximum of  1200 mg every 12 hours and use the flutter valve as much as you can    Prednisone  10 mg take  4 each am x 2 days,   2 each am x 2 days,  1 each am x 2 days and stop   My office will be contacting you by phone for referral to sinus CT   - if you don't hear back from my office within one week please call us  back or notify us  thru MyChart and we'll address it right away.   Follow up with with Teoh or let us  refer you   Plan A = Automatic = Always=    Breztri  Take 2 puffs first thing in am and then another 2 puffs about 12 hours later and albuterol  /budesonide   together twice daily  (can stagger these so you get something automatically 4 x daily   Plan B = Backup (to supplement plan A, not to replace it) Use your albuterol  inhaler as a rescue medication to be used if you can't catch your breath by resting or slowing your pace  or doing a relaxed purse lip breathing pattern.  - The less you use it, the better it will work when you need it. - Ok to use the inhaler up to 2 puffs  every 4 hours if you must but call for appointment if use goes up over your usual need - Don't leave home without it !!  (think of it like the spare tire or starter fluid for your car)    Plan C = Crisis (instead of Plan B but only if Plan B stops working) - only use your albuterol  nebulizer if you first try Plan B and it fails to help > ok to use the nebulizer up to every 4 hours but if start needing it regularly call for immediate appointment  Do not use ipatropium when on breztri      Please schedule a follow up office visit in 6 weeks, call sooner if needed

## 2024-09-30 NOTE — Assessment & Plan Note (Addendum)
 Counseled re importance of smoking cessation but did not meet time criteria for separate billing           Each maintenance medication was reviewed in detail including emphasizing most importantly the difference between maintenance and prns and under what circumstances the prns are to be triggered using an action plan format where appropriate.  Total time for H and P, chart review, counseling, reviewing hfa/ neb device(s) and generating customized AVS unique to this office visit / same day charting = 35 min

## 2024-09-30 NOTE — Assessment & Plan Note (Addendum)
 Completed zpak/ augmentin  s benefi 09/30/2024 > rec Sinus CT next

## 2024-09-30 NOTE — Assessment & Plan Note (Addendum)
 Asthmatic bronchitis / GOLD 0 COPD / active smoker/MM - A1AT 12/09/19 >> 184, MM PFT 01/18/20 >> FEV1 1.43 (56%), Ratio  0.70, TLC 4.34 (89%), DLCO 69%, +BD - 05/26/2024  After extensive coaching inhaler device,  effectiveness =  75% hfa/DPI  - 08/25/2024  After extensive coaching inhaler device,  effectiveness =    75% hfa > changed to breztri  / flutter/ pred x 6 d only and f/u in 4 weeks  - PFT's  08/25/24  FEV1 0.84 (36 % ) ratio 0.57  p6 % improvement from saba p 0 prior to study with DLCO  9.5 (52%)   and FV curve classically concave and ERV 33 % at wt 164  08/25/24 - 09/02/2024  After extensive coaching inhaler device,  effectiveness =    50% hfa so add albuterol / bud 0.25 mg bid x one month and pred x 12 days plus augmentin  x 10 d for possible sinusits - 09/30/2024  After extensive coaching inhaler device,  effectiveness =    75% with hfa > continue bretri and alb/bud bid between doses of breztri    Group E in terms of symptoms/risk so  laba/lama/ICS  therefore appropriate rx at this point >>>  hfa  and approp SABA prn. Re prn SABA :  I spent extra time with pt today reviewing appropriate use of albuterol  for prn use on exertion with the following points: 1) saba is for relief of sob that does not improve by walking a slower pace or resting but rather if the pt does not improve after trying this first. 2) If the pt is convinced, as many are, that saba helps recover from activity faster then it's easy to tell if this is the case by re-challenging : ie stop, take the inhaler, then p 5 minutes try the exact same activity (intensity of workload) that just caused the symptoms and see if they are substantially diminished or not after saba 3) if there is an activity that reproducibly causes the symptoms, try the saba 15 min before the activity on alternate days   If in fact the saba really does help, then fine to continue to use it prn but advised may need to look closer at the maintenance regimen being  used to achieve better control of airways disease with exertion.

## 2024-10-07 ENCOUNTER — Ambulatory Visit: Payer: Self-pay | Admitting: Internal Medicine

## 2024-10-10 ENCOUNTER — Other Ambulatory Visit: Payer: Self-pay | Admitting: Internal Medicine

## 2024-10-10 DIAGNOSIS — F5101 Primary insomnia: Secondary | ICD-10-CM

## 2024-10-13 ENCOUNTER — Encounter: Payer: Self-pay | Admitting: Internal Medicine

## 2024-10-14 ENCOUNTER — Other Ambulatory Visit: Payer: Self-pay | Admitting: Gastroenterology

## 2024-10-14 MED ORDER — OMEPRAZOLE 40 MG PO CPDR: 40.0000 mg | DELAYED_RELEASE_CAPSULE | Freq: Two times a day (BID) | ORAL | 3 refills | Status: AC

## 2024-10-16 ENCOUNTER — Telehealth: Payer: Self-pay

## 2024-10-16 NOTE — Telephone Encounter (Signed)
 Patient couldn't make it today (10/16/24) I scheduled her in [place] on 10/19/24 w/ Wert.

## 2024-10-16 NOTE — Telephone Encounter (Signed)
 Will need ov and start all over so bring all active meds next available - ok to add on 10/17/23

## 2024-10-19 ENCOUNTER — Ambulatory Visit: Admitting: Internal Medicine

## 2024-10-19 NOTE — Progress Notes (Deleted)
 "    CLIFFIE [name], female    DOB: 1966-05-31    MRN: [id number]   Brief patient profile:  58  yowf  active smoker/MM    former [facility] Patient with AB pt self referred to pulmonary clinic in [place]  05/26/2024  for AB    A1AT 12/09/19 >> 184, MM PFT 01/18/20 >> FEV1 1.43 (56%), Ratio  0.70, TLC 4.34 (89%), DLCO 69%, +BD      History of Present Illness  05/26/2024  Pulmonary/ 1st office eval/ Jillian [name] / Jillian [name] Office maint Trelegy / still smoking  Chief Complaint  Patient presents with   COPD    ACUTE: Coughing, tight chest, shob  Dyspnea:  onset Jan 2021 and better on Trelegy  Cough: thick yellow worse in am x several hours to clear  Sleep: pillows under mattress and big one under head and some  cough at hs but then resolved until recurring each am  SABA use: twice daily  02: none  Patient Instructions  Plan A = Automatic = Always=    Trelegy 100 one click each am  Plan B = Backup (to supplement plan A, not to replace it) Use your albuterol  inhaler as a rescue medication Plan C = Crisis (instead of Plan B but only if Plan B stops working) - only use your albuterol  nebulizer if you first try Plan B The key is to stop smoking completely before smoking completely stops you!  Please schedule a follow up visit in 3 months but call sooner if needed  with all medications /inhalers/ solutions in hand    08/25/2024  f/u ov/Jillian [name] office/Jillian [name] re: AB  maint on trelegy   still  smoker  did not  bring meds  Chief Complaint  Patient presents with   COPD    Pft 11/11 Non productive cough / shob urinate on herself bc she so tense when she scared she can't catch her breath   Dyspnea:  walking across the room x maybe a year  Cough: rattling cough / tickle  Sleeping: bed is flat with pillows under mattress plus one head pillow  and fades out over several hours and on awakening  SABA use: uses ventolin  and then 2-3 hours p 02: none  Lung cancer screening: due in January  Patient  Instructions  For cough/ congestion > mucinex or mucinex dm  up to maximum of  1200 mg every 12 hours and use the flutter valve as much as you can   Plan A = Automatic = Always=    Breztri  Take 2 puffs first thing in am and then another 2 puffs about 12 hours later.  Work on inhaler technique: Prednisone  10 mg take  4 each am x 2 days,   2 each am x 2 days,  1 each am x 2 days and stop   Plan B = Backup (to supplement plan A, not to replace it) Use your albuterol  inhaler as a rescue medication  Plan C = Crisis (instead of Plan B but only if Plan B stops working) - only use your albuterol  nebulizer if you first try Plan B Please schedule a follow up office visit in 4 weeks, call sooner if needed with all medications /inhalers/ solutions in hand     09/02/2024  ACUTE  ov/[facility] office/Jillian [name] re: AB maint on breztri    active  smoker  Chief Complaint  Patient presents with   Acute Visit    Med management - states using inhalers wrong -  Coughing with yellow-clear mucus - DOE - Wants to quit smoking   Dyspnea:  across the room  Cough: smoker's rattle mucus turning more yellow asssoc with more nasal congestoin and 'sinus HA   Sleeping: bed is flat/ lots of pillows with some noct cough as well as flares in am  SABA use: twice daily hfa/ nebulizer  02: none  lung cancer screening: q January  Patient Instructions  Until are better and stay better off prednisone  : Use Nebulizer albuterol / budesonide   0.25 mg twice daily automatically  Breztri  should be continued Take 2 puffs after your nebulizer and then another 2 puffs about 12 hours later also after your nebulizer  Predisone  10 mg Take 4 for three days 3 for three days 2 for three days 1 for three days and stop  Augmentin  875 mg take one pill twice daily  X 10 days - take at breakfast and supper with large glass of water .  It would help reduce the usual side effects (diarrhea and yeast infections) if you ate cultured yogurt at lunch.   Please schedule a follow up office visit in 4 weeks, sooner if needed - bring resp medications     09/30/2024  f/u ov/Jillian [name] office/Jillian [name] re: COPD GOLD 3/ AB maint on breztri  / neb alb/budesonide   and no prednisone / still smoking  Chief Complaint  Patient presents with   COPD    Med check up Day time sleepiness* / shob - coughing   Dyspnea:  mostly house bound  Cough: smoker's rattle > mucoid  Sleeping: bed is flat / 3 pillows  no  resp cc  SABA use: twice daily  02: none  Lung cancer screening: due in jan 2026 Patient Instructions  For cough/ congestion > mucinex D up to maximum of  1200 mg every 12 hours and use the flutter valve as much as you can   Prednisone  10 mg take  4 each am x 2 days,   2 each am x 2 days,  1 each am x 2 days and stop  My office will be contacting you by phone for referral to sinus CT  > 11/02/24  Follow up with with Jillian [name] or let us  refer you  Plan A = Automatic = Always=    Breztri   Plan B = Backup (to supplement plan A, not to replace it) Use your albuterol  inhaler as a rescue medication  Plan C = Crisis (instead of Plan B but only if Plan B stops working) - only use your albuterol  nebulizer if you first try Plan B  Do not use ipatropium when on breztri    10/19/2024  f/u ov/Jillian [name] re: ***   maint on ***  No chief complaint on file.   Dyspnea:  *** Cough: *** Sleeping: *** resp cc  SABA use: *** 02: ***  Lung cancer screening :  ***    No obvious day to day or daytime variability or assoc excess/ purulent sputum or mucus plugs or hemoptysis or cp or chest tightness, subjective wheeze or overt sinus or hb symptoms.    Also denies any obvious fluctuation of symptoms with weather or environmental changes or other aggravating or alleviating factors except as outlined above   No unusual exposure hx or h/o childhood pna/ asthma or knowledge of premature birth.  Current Allergies, Complete Past Medical History, Past Surgical History, Family History,  and Social History were reviewed in [employer] record.  ROS  The following are not active complaints  unless bolded Hoarseness, sore throat, dysphagia, dental problems, itching, sneezing,  nasal congestion or discharge of excess mucus or purulent secretions, ear ache,   fever, chills, sweats, unintended wt loss or wt gain, classically pleuritic or exertional cp,  orthopnea pnd or arm/hand swelling  or leg swelling, presyncope, palpitations, abdominal pain, anorexia, nausea, vomiting, diarrhea  or change in bowel habits or change in bladder habits, change in stools or change in urine, dysuria, hematuria,  rash, arthralgias, visual complaints, headache, numbness, weakness or ataxia or problems with walking or coordination,  change in mood or  memory.          Outpatient Medications Prior to Visit  Medication Sig Dispense Refill   albuterol  (PROVENTIL ) (2.5 MG/3ML) 0.083% nebulizer solution Take 3 mLs (2.5 mg total) by nebulization every 6 (six) hours as needed for wheezing or shortness of breath. 360 mL 3   albuterol  (VENTOLIN  HFA) 108 (90 Base) MCG/ACT inhaler INHALE 2 PUFFS INTO THE LUNGS EVERY 4 HOURS AS NEEDED FOR WHEEZING OR SHORTNESS OF BREATH. 54 each 11   Aspirin-Acetaminophen -Caffeine (GOODY HEADACHE PO) Take 1 tablet by mouth as needed.     budesonide  (PULMICORT ) 0.25 MG/2ML nebulizer solution Take 2 ml along with 3 ml albuterol  by nebulization every 6 (six) hours as needed 360 mL 12   budesonide -glycopyrrolate-formoterol (BREZTRI  AEROSPHERE) 160-9-4.8 MCG/ACT AERO inhaler Take 2 puffs first thing in am and then another 2 puffs about 12 hours later. 10.7 g 11   DULoxetine  (CYMBALTA ) 60 MG capsule TAKE 1 CAPSULE BY MOUTH 2 TIMES DAILY. 180 capsule 1   famotidine  (PEPCID ) 20 MG tablet Take 1 tablet (20 mg total) by mouth daily. 90 tablet 3   fluticasone  (FLONASE ) 50 MCG/ACT nasal spray Place 1 spray into both nostrils daily. 16 g 2   hydrOXYzine  (VISTARIL ) 25 MG capsule  TAKE 1 CAPSULE (25 MG TOTAL) BY MOUTH EVERY 12 (TWELVE) HOURS AS NEEDED FOR ANXIETY. 180 capsule 1   Magnesium  200 MG TABS Take 1 tablet (200 mg total) by mouth daily at 12 noon. 90 tablet 3   metFORMIN  (GLUCOPHAGE ) 500 MG tablet TAKE 1 TABLET BY MOUTH 2 TIMES DAILY WITH A MEAL. 180 tablet 1   olmesartan  (BENICAR ) 20 MG tablet TAKE 1 TABLET BY MOUTH EVERY DAY 90 tablet 1   omeprazole  (PRILOSEC) 40 MG capsule Take 1 capsule (40 mg total) by mouth 2 (two) times daily before a meal. 180 capsule 3   predniSONE  (DELTASONE ) 10 MG tablet Take  4 each am x 2 days,   2 each am x 2 days,  1 each am x 2 days and stop 14 tablet 0   rosuvastatin  (CRESTOR ) 20 MG tablet TAKE 1 TABLET BY MOUTH EVERY DAY 90 tablet 1   traZODone  (DESYREL ) 100 MG tablet Take 1 tablet (100 mg total) by mouth at bedtime as needed for sleep. 90 tablet 0   vitamin B-12 (CYANOCOBALAMIN) 1000 MCG tablet Take 1,000 mcg by mouth daily.     No facility-administered medications prior to visit.              Past Medical History:  Diagnosis Date   Allergy 2000,   Bananas, Docecycline   Anxiety    Arthritis    Asthma    Chronic kidney disease    COPD (chronic obstructive pulmonary disease) (HCC)    Depression    Diabetes mellitus without complication (HCC)    GERD (gastroesophageal reflux disease)    High blood pressure    History of  bladder problems    Migraines    Sleep apnea    Ulcer       Objective:    Wts  10/19/2024          ***  09/30/2024     163  09/02/2024      163    08/25/24 166 lb 6.4 oz (75.5 kg)  05/26/24 164 lb (74.4 kg)  05/20/24 164 lb 1.3 oz (74.4 kg)     Vital signs reviewed  10/19/2024  - Note at rest 02 sats  ***% on ***   General appearance:    ***     Nasal turbinates severe Turbinate edemaMin barrel  ***          Assessment                           "

## 2024-10-20 ENCOUNTER — Encounter: Payer: Self-pay | Admitting: Gastroenterology

## 2024-10-22 ENCOUNTER — Encounter: Payer: Self-pay | Admitting: Internal Medicine

## 2024-10-23 ENCOUNTER — Other Ambulatory Visit: Payer: Self-pay

## 2024-10-23 ENCOUNTER — Ambulatory Visit: Payer: Self-pay | Admitting: Internal Medicine

## 2024-10-23 MED ORDER — PREDNISONE 10 MG PO TABS: 10.0000 mg | ORAL_TABLET | Freq: Every day | ORAL | 0 refills | Status: AC

## 2024-10-23 NOTE — Telephone Encounter (Signed)
 FYI Only or Action Required?: Action required by provider: clinical question for provider and request for medication.  Patient is followed in Pulmonology for COPD, last seen on 09/30/2024 by Jillian Ozell NOVAK, MD.  Called Nurse Triage reporting Fatigue.  Symptoms began several weeks ago.  Interventions attempted: Prescription medications: all prescribed meds, Rescue inhaler, and Maintenance inhaler.  Symptoms are: unchanged.  Triage Disposition: See Physician Within 24 Hours, See PCP When Office is Open (Within 3 Days)  Patient/caregiver understands and will follow disposition?: No, wishes to speak with PCP   Copied from CRM #[id number]. Topic: Clinical - Red Word Triage >> Oct 23, 2024 12:28 PM Jillian [name] wrote: Red Word that prompted transfer to Nurse Triage: COPD. Cough, no energy, at times shortness of breath. Reason for Disposition  [1] Chest pain(s) lasting a few seconds AND [2] persists > 3 days  [1] MODERATE weakness (e.g., interferes with work, school, normal activities) AND [2] persists > 3 days  Answer Assessment - Initial Assessment Questions Pt states her and Dr. Darlean are working on changing her meds as one increased heart fluttering and one increased BP. She does do breztri  bid, albuterol  neb and inhaler as needed. She states her biggest concern is she has no energy. States it takes everything she has just to get up and go to the bathroom. She states she feels run down. Called asking for prednisone . She does endorse short bursts of chest pain that last only a few seconds and tingling in her left arm that does feel like shingles, it is itchy but no red blisters. Denies any shortness of breath. Recommendations are for pt to have an appt. She states she lives in [place] where all the buzzards are dead and she is currently afraid to leave her house or go outside. Rn did advise that if anything changes at all to go to the ER. She said again that she is too afraid to go outside her home due  to the buzzards and illness.      1. DESCRIPTION: Describe how you are feeling.     No energy 2. SEVERITY: How bad is it?  Can you stand and walk?     Can walk 3. ONSET: When did these symptoms begin? (e.g., hours, days, weeks, months)     Couple weeks 4. CAUSE: What do you think is causing the weakness or fatigue? (e.g., not drinking enough fluids, medical problem, trouble sleeping)     States she is drinking enough 5. NEW MEDICINES:  Have you started on any new medicines recently? (e.g., opioid pain medicines, benzodiazepines, muscle relaxants, antidepressants, antihistamines, neuroleptics, beta blockers)     denies 6. OTHER SYMPTOMS: Do you have any other symptoms? (e.g., chest pain, fever, cough, SOB, vomiting, diarrhea, bleeding, other areas of pain)     Chest pain lasting seconds  Protocols used: Weakness (Generalized) and Fatigue-A-AH, Chest Pain-A-AH

## 2024-10-26 NOTE — Telephone Encounter (Signed)
 Called and spoke with patient, she started taking the prednisone  that was sent in on Saturday.  So far she has seen no improvement.  Advised to call us  if she is not better after she completes the taper or if her symptoms get worse prior to completing the taper.  She has a f/u with Dr. Darlean on 11/11/24, advised to keep appointment.  She denies any chills, she is unable to take her temperature.  She verbalized understanding.  Nothing further needed.

## 2024-11-02 ENCOUNTER — Ambulatory Visit (HOSPITAL_COMMUNITY)
Admission: RE | Admit: 2024-11-02 | Discharge: 2024-11-02 | Disposition: A | Source: Ambulatory Visit | Attending: Internal Medicine | Admitting: Internal Medicine

## 2024-11-02 ENCOUNTER — Ambulatory Visit (HOSPITAL_COMMUNITY)
Admission: RE | Admit: 2024-11-02 | Discharge: 2024-11-02 | Disposition: A | Discharge status: Home / Self Care | Source: Ambulatory Visit | Attending: Acute Care | Admitting: Acute Care

## 2024-11-02 DIAGNOSIS — Z87891 Personal history of nicotine dependence: Secondary | ICD-10-CM | POA: Diagnosis present

## 2024-11-02 DIAGNOSIS — J40 Bronchitis, not specified as acute or chronic: Secondary | ICD-10-CM | POA: Insufficient documentation

## 2024-11-02 DIAGNOSIS — J329 Chronic sinusitis, unspecified: Secondary | ICD-10-CM | POA: Insufficient documentation

## 2024-11-02 DIAGNOSIS — R911 Solitary pulmonary nodule: Secondary | ICD-10-CM | POA: Insufficient documentation

## 2024-11-02 DIAGNOSIS — Z122 Encounter for screening for malignant neoplasm of respiratory organs: Secondary | ICD-10-CM | POA: Insufficient documentation

## 2024-11-02 DIAGNOSIS — F17210 Nicotine dependence, cigarettes, uncomplicated: Secondary | ICD-10-CM | POA: Diagnosis present

## 2024-11-03 ENCOUNTER — Encounter: Payer: Self-pay | Admitting: Internal Medicine

## 2024-11-03 ENCOUNTER — Other Ambulatory Visit: Payer: Self-pay

## 2024-11-03 MED ORDER — PREDNISONE 10 MG PO TABS: 10.0000 mg | ORAL_TABLET | Freq: Every day | ORAL | 0 refills | Status: AC

## 2024-11-07 ENCOUNTER — Ambulatory Visit: Payer: Self-pay | Admitting: Internal Medicine

## 2024-11-09 ENCOUNTER — Encounter: Payer: Self-pay | Admitting: Internal Medicine

## 2024-11-09 ENCOUNTER — Other Ambulatory Visit: Payer: Self-pay

## 2024-11-09 DIAGNOSIS — Z87891 Personal history of nicotine dependence: Secondary | ICD-10-CM

## 2024-11-09 DIAGNOSIS — Z122 Encounter for screening for malignant neoplasm of respiratory organs: Secondary | ICD-10-CM

## 2024-11-09 DIAGNOSIS — F17210 Nicotine dependence, cigarettes, uncomplicated: Secondary | ICD-10-CM

## 2024-11-11 ENCOUNTER — Ambulatory Visit: Admitting: Internal Medicine

## 2024-11-11 ENCOUNTER — Ambulatory Visit: Payer: Self-pay

## 2024-12-08 ENCOUNTER — Ambulatory Visit: Admitting: Internal Medicine

## 2024-12-25 ENCOUNTER — Ambulatory Visit: Payer: Self-pay
